# Patient Record
Sex: Male | Born: 1965 | Race: White | Hispanic: No | Smoking: Former smoker
Health system: Southern US, Community
[De-identification: ages and names within clinical notes are randomized; demographics above are authoritative.]

## PROBLEM LIST (undated history)

## (undated) DIAGNOSIS — K219 Gastro-esophageal reflux disease without esophagitis: Secondary | ICD-10-CM

## (undated) DIAGNOSIS — J449 Chronic obstructive pulmonary disease, unspecified: Secondary | ICD-10-CM

## (undated) DIAGNOSIS — G4730 Sleep apnea, unspecified: Secondary | ICD-10-CM

## (undated) DIAGNOSIS — J45909 Unspecified asthma, uncomplicated: Secondary | ICD-10-CM

## (undated) DIAGNOSIS — T7840XA Allergy, unspecified, initial encounter: Secondary | ICD-10-CM

## (undated) DIAGNOSIS — G43909 Migraine, unspecified, not intractable, without status migrainosus: Secondary | ICD-10-CM

## (undated) DIAGNOSIS — J40 Bronchitis, not specified as acute or chronic: Secondary | ICD-10-CM

## (undated) DIAGNOSIS — G4700 Insomnia, unspecified: Secondary | ICD-10-CM

## (undated) HISTORY — DX: Insomnia, unspecified: G47.00

## (undated) HISTORY — DX: Unspecified asthma, uncomplicated: J45.909

## (undated) HISTORY — DX: Gastro-esophageal reflux disease without esophagitis: K21.9

## (undated) HISTORY — DX: Migraine, unspecified, not intractable, without status migrainosus: G43.909

## (undated) HISTORY — DX: Allergy, unspecified, initial encounter: T78.40XA

## (undated) HISTORY — DX: Sleep apnea, unspecified: G47.30

## (undated) HISTORY — PX: KNEE SURGERY: SHX244

---

## 2004-03-03 ENCOUNTER — Ambulatory Visit (HOSPITAL_COMMUNITY): Admission: RE | Admit: 2004-03-03 | Discharge: 2004-03-03 | Payer: Self-pay | Admitting: Emergency Medicine

## 2004-12-09 ENCOUNTER — Ambulatory Visit (HOSPITAL_COMMUNITY): Admission: RE | Admit: 2004-12-09 | Discharge: 2004-12-09 | Payer: Self-pay | Admitting: Emergency Medicine

## 2009-05-25 ENCOUNTER — Inpatient Hospital Stay (HOSPITAL_COMMUNITY): Admission: EM | Admit: 2009-05-25 | Discharge: 2009-05-28 | Payer: Self-pay | Admitting: Emergency Medicine

## 2009-05-30 ENCOUNTER — Emergency Department (HOSPITAL_COMMUNITY): Admission: EM | Admit: 2009-05-30 | Discharge: 2009-05-30 | Payer: Self-pay | Admitting: Emergency Medicine

## 2009-08-14 ENCOUNTER — Ambulatory Visit (HOSPITAL_COMMUNITY): Admission: RE | Admit: 2009-08-14 | Discharge: 2009-08-14 | Payer: Self-pay | Admitting: Internal Medicine

## 2010-04-18 ENCOUNTER — Encounter: Payer: Self-pay | Admitting: Emergency Medicine

## 2010-06-18 LAB — DIFFERENTIAL
Basophils Absolute: 0 10*3/uL (ref 0.0–0.1)
Basophils Absolute: 0.1 10*3/uL (ref 0.0–0.1)
Basophils Relative: 0 % (ref 0–1)
Basophils Relative: 1 % (ref 0–1)
Eosinophils Absolute: 0 10*3/uL (ref 0.0–0.7)
Eosinophils Absolute: 0.6 10*3/uL (ref 0.0–0.7)
Eosinophils Relative: 0 % (ref 0–5)
Eosinophils Relative: 8 % — ABNORMAL HIGH (ref 0–5)
Lymphocytes Relative: 13 % (ref 12–46)
Lymphocytes Relative: 9 % — ABNORMAL LOW (ref 12–46)
Lymphs Abs: 0.7 10*3/uL (ref 0.7–4.0)
Lymphs Abs: 0.9 10*3/uL (ref 0.7–4.0)
Monocytes Absolute: 0.5 10*3/uL (ref 0.1–1.0)
Monocytes Absolute: 0.5 10*3/uL (ref 0.1–1.0)
Monocytes Relative: 6 % (ref 3–12)
Monocytes Relative: 7 % (ref 3–12)
Neutro Abs: 5.3 10*3/uL (ref 1.7–7.7)
Neutro Abs: 7.5 10*3/uL (ref 1.7–7.7)
Neutrophils Relative %: 72 % (ref 43–77)
Neutrophils Relative %: 86 % — ABNORMAL HIGH (ref 43–77)

## 2010-06-18 LAB — POCT CARDIAC MARKERS
CKMB, poc: 1.2 ng/mL (ref 1.0–8.0)
Myoglobin, poc: 77.4 ng/mL (ref 12–200)
Troponin i, poc: <0.05 ng/mL (ref 0.00–0.09)

## 2010-06-18 LAB — GLUCOSE, CAPILLARY
Glucose-Capillary: 124 mg/dL — ABNORMAL HIGH (ref 70–99)
Glucose-Capillary: 136 mg/dL — ABNORMAL HIGH (ref 70–99)
Glucose-Capillary: 158 mg/dL — ABNORMAL HIGH (ref 70–99)

## 2010-06-18 LAB — BLOOD GAS, ARTERIAL
Acid-base deficit: 1.9 mmol/L (ref 0.0–2.0)
Bicarbonate: 22.1 mEq/L (ref 20.0–24.0)
O2 Content: 2 L/min
O2 Saturation: 93.1 %
Patient temperature: 37
pCO2 arterial: 35.4 mmHg (ref 35.0–45.0)
pH, Arterial: 7.412 (ref 7.350–7.450)
pO2, Arterial: 77.7 mmHg — ABNORMAL LOW (ref 80.0–100.0)

## 2010-06-18 LAB — CBC
HCT: 41.2 % (ref 39.0–52.0)
HCT: 44.6 % (ref 39.0–52.0)
Hemoglobin: 14.3 g/dL (ref 13.0–17.0)
Hemoglobin: 15.4 g/dL (ref 13.0–17.0)
MCHC: 34.4 g/dL (ref 30.0–36.0)
MCHC: 34.8 g/dL (ref 30.0–36.0)
MCV: 90 fL (ref 78.0–100.0)
MCV: 90.4 fL (ref 78.0–100.0)
Platelets: 176 10*3/uL (ref 150–400)
Platelets: 188 10*3/uL (ref 150–400)
RBC: 4.57 MIL/uL (ref 4.22–5.81)
RBC: 4.93 MIL/uL (ref 4.22–5.81)
RDW: 12.9 % (ref 11.5–15.5)
RDW: 13.3 % (ref 11.5–15.5)
WBC: 7.3 10*3/uL (ref 4.0–10.5)
WBC: 8.8 10*3/uL (ref 4.0–10.5)

## 2010-06-18 LAB — COMPREHENSIVE METABOLIC PANEL
ALT: 24 U/L (ref 0–53)
AST: 19 U/L (ref 0–37)
Albumin: 3.9 g/dL (ref 3.5–5.2)
Alkaline Phosphatase: 65 U/L (ref 39–117)
BUN: 10 mg/dL (ref 6–23)
CO2: 23 mEq/L (ref 19–32)
Calcium: 8.9 mg/dL (ref 8.4–10.5)
Chloride: 103 mEq/L (ref 96–112)
Creatinine, Ser: 0.81 mg/dL (ref 0.4–1.5)
GFR calc Af Amer: >60 mL/min (ref >60)
GFR calc non Af Amer: >60 mL/min (ref >60)
Glucose, Bld: 141 mg/dL — ABNORMAL HIGH (ref 70–99)
Potassium: 3.6 mEq/L (ref 3.5–5.1)
Sodium: 134 mEq/L — ABNORMAL LOW (ref 135–145)
Total Bilirubin: 0.7 mg/dL (ref 0.3–1.2)
Total Protein: 7.1 g/dL (ref 6.0–8.3)

## 2010-06-18 LAB — BASIC METABOLIC PANEL
BUN: 11 mg/dL (ref 6–23)
CO2: 27 mEq/L (ref 19–32)
Calcium: 9.3 mg/dL (ref 8.4–10.5)
Chloride: 106 mEq/L (ref 96–112)
Creatinine, Ser: 0.83 mg/dL (ref 0.4–1.5)
GFR calc Af Amer: >60 mL/min (ref >60)
GFR calc non Af Amer: >60 mL/min (ref >60)
Glucose, Bld: 104 mg/dL — ABNORMAL HIGH (ref 70–99)
Potassium: 4 mEq/L (ref 3.5–5.1)
Sodium: 139 mEq/L (ref 135–145)

## 2010-06-18 LAB — TSH: TSH: 1.903 u[IU]/mL (ref 0.350–4.500)

## 2010-06-18 LAB — BRAIN NATRIURETIC PEPTIDE: Pro B Natriuretic peptide (BNP): <30 pg/mL (ref 0.0–100.0)

## 2010-06-22 LAB — BASIC METABOLIC PANEL
BUN: 16 mg/dL (ref 6–23)
BUN: 30 mg/dL — ABNORMAL HIGH (ref 6–23)
CO2: 23 mEq/L (ref 19–32)
Calcium: 9.2 mg/dL (ref 8.4–10.5)
Calcium: 9.4 mg/dL (ref 8.4–10.5)
Chloride: 102 mEq/L (ref 96–112)
Chloride: 104 mEq/L (ref 96–112)
Creatinine, Ser: 0.92 mg/dL (ref 0.4–1.5)
Creatinine, Ser: 1.22 mg/dL (ref 0.4–1.5)
GFR calc Af Amer: >60 mL/min (ref >60)
GFR calc Af Amer: >60 mL/min (ref >60)
GFR calc non Af Amer: >60 mL/min (ref >60)
GFR calc non Af Amer: >60 mL/min (ref >60)
Glucose, Bld: 141 mg/dL — ABNORMAL HIGH (ref 70–99)
Potassium: 4 mEq/L (ref 3.5–5.1)
Sodium: 136 mEq/L (ref 135–145)

## 2010-06-22 LAB — DIFFERENTIAL
Basophils Absolute: 0 K/uL (ref 0.0–0.1)
Basophils Relative: 0 % (ref 0–1)
Eosinophils Absolute: 0 10*3/uL (ref 0.0–0.7)
Eosinophils Relative: 0 % (ref 0–5)
Lymphocytes Relative: 15 % (ref 12–46)
Lymphs Abs: 1.9 10*3/uL (ref 0.7–4.0)
Monocytes Absolute: 1 K/uL (ref 0.1–1.0)
Monocytes Relative: 8 % (ref 3–12)
Neutro Abs: 10 10*3/uL — ABNORMAL HIGH (ref 1.7–7.7)
Neutrophils Relative %: 77 % (ref 43–77)

## 2010-06-22 LAB — POCT CARDIAC MARKERS
CKMB, poc: <1 ng/mL — ABNORMAL LOW (ref 1.0–8.0)
Myoglobin, poc: 72.1 ng/mL (ref 12–200)
Troponin i, poc: <0.05 ng/mL (ref 0.00–0.09)

## 2010-06-22 LAB — CBC
HCT: 55.3 % — ABNORMAL HIGH (ref 39.0–52.0)
Hemoglobin: 19.1 g/dL — ABNORMAL HIGH (ref 13.0–17.0)
MCHC: 34.6 g/dL (ref 30.0–36.0)
MCV: 92.3 fL (ref 78.0–100.0)
Platelets: 321 10*3/uL (ref 150–400)
RBC: 6 MIL/uL — ABNORMAL HIGH (ref 4.22–5.81)
RDW: 12.8 % (ref 11.5–15.5)
WBC: 13.1 10*3/uL — ABNORMAL HIGH (ref 4.0–10.5)

## 2010-06-22 LAB — D-DIMER, QUANTITATIVE: D-Dimer, Quant: 3.05 ug/mL-FEU — ABNORMAL HIGH (ref 0.00–0.48)

## 2010-06-22 LAB — GLUCOSE, CAPILLARY
Glucose-Capillary: 103 mg/dL — ABNORMAL HIGH (ref 70–99)
Glucose-Capillary: 119 mg/dL — ABNORMAL HIGH (ref 70–99)
Glucose-Capillary: 126 mg/dL — ABNORMAL HIGH (ref 70–99)
Glucose-Capillary: 141 mg/dL — ABNORMAL HIGH (ref 70–99)
Glucose-Capillary: 156 mg/dL — ABNORMAL HIGH (ref 70–99)

## 2010-06-22 LAB — BASIC METABOLIC PANEL WITH GFR
CO2: 22 meq/L (ref 19–32)
Glucose, Bld: 114 mg/dL — ABNORMAL HIGH (ref 70–99)
Potassium: 4 meq/L (ref 3.5–5.1)
Sodium: 135 meq/L (ref 135–145)

## 2010-06-22 LAB — HEMOGLOBIN A1C
Hgb A1c MFr Bld: 6 % (ref 4.6–6.1)
Mean Plasma Glucose: 126 mg/dL

## 2010-06-22 LAB — APTT: aPTT: 26 s (ref 24–37)

## 2010-06-22 LAB — PROTIME-INR
INR: 0.94 (ref 0.00–1.49)
Prothrombin Time: 12.5 seconds (ref 11.6–15.2)

## 2011-04-21 ENCOUNTER — Ambulatory Visit: Payer: Self-pay

## 2011-04-21 DIAGNOSIS — J45909 Unspecified asthma, uncomplicated: Secondary | ICD-10-CM

## 2011-04-21 DIAGNOSIS — G4700 Insomnia, unspecified: Secondary | ICD-10-CM

## 2011-07-07 ENCOUNTER — Ambulatory Visit: Payer: Self-pay | Admitting: Family Medicine

## 2011-07-07 VITALS — BP 122/80 | HR 103 | Temp 98.7°F | Resp 16 | Ht 71.0 in | Wt 269.0 lb

## 2011-07-07 DIAGNOSIS — G4700 Insomnia, unspecified: Secondary | ICD-10-CM

## 2011-07-07 DIAGNOSIS — L02219 Cutaneous abscess of trunk, unspecified: Secondary | ICD-10-CM

## 2011-07-07 DIAGNOSIS — J45901 Unspecified asthma with (acute) exacerbation: Secondary | ICD-10-CM

## 2011-07-07 DIAGNOSIS — L02211 Cutaneous abscess of abdominal wall: Secondary | ICD-10-CM

## 2011-07-07 MED ORDER — DOXYCYCLINE HYCLATE 100 MG PO TABS: 100.0000 mg | ORAL_TABLET | Freq: Two times a day (BID) | ORAL | Status: AC

## 2011-07-07 MED ORDER — ALBUTEROL SULFATE HFA 108 (90 BASE) MCG/ACT IN AERS: 2.0000 | INHALATION_SPRAY | Freq: Four times a day (QID) | RESPIRATORY_TRACT | Status: DC | PRN

## 2011-07-07 MED ORDER — BUDESONIDE-FORMOTEROL FUMARATE 160-4.5 MCG/ACT IN AERO: 2.0000 | INHALATION_SPRAY | RESPIRATORY_TRACT | Status: DC | PRN

## 2011-07-07 MED ORDER — CLONAZEPAM 1 MG PO TABS: 2.0000 mg | ORAL_TABLET | Freq: Every evening | ORAL | Status: DC | PRN

## 2011-07-07 MED ORDER — PREDNISONE 20 MG PO TABS: ORAL_TABLET | ORAL | Status: DC

## 2011-07-07 MED ORDER — AZITHROMYCIN 250 MG PO TABS: ORAL_TABLET | ORAL | Status: DC

## 2011-07-07 NOTE — Progress Notes (Signed)
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  1 he's had about 2 weeks of progressive wheezing and cough which has been productive of small amounts of yellow mucus. He's had no hemoptysis or shortness of breath. His symptoms are progressive and in the past she has required prednisone to break these episodes. He is about out of his pro-air now and he stopped his Symbicort  2 the patient has chronic problems with insomnia. He's tried a variety of medicines including Xanax, lorazepam, Ambien, and Flexeril. Last night he slept fairly well using Flexeril lorazepam and Xanax which he borrowed. He's had a lifelong problem with insomnia and frequently awakens with even faint noises.  3 patient has 1-2 weeks of right upper quadrant abdominal wall redness and swelling unresponsive to heat.  Objective: No acute distress, talkative. I spent 15 minutes talking about insomnia with patient  HEENT: Unremarkable  Chest: Diffuse rhonchi and wheezes bilaterally without rales.  Heart: Regular no murmur  Abdomen: 2 x 4 cm elliptical swollen erythematous area over the 11th rib.    Assessment: Chronic insomnia, not responding to lorazepam; acute bronchitis with reactive airways disease in a smoker; small abscess on right abdomen  Plan: Switch from lorazepam to clonazepam  Prednisone doxycycline and pro-air with Symbicort daily  Send abscess culture to lab

## 2011-07-07 NOTE — Progress Notes (Signed)
VCO.  Local anesthesia with .5 cc 2% plain lidocaine. SP&D. Incision with 11 blade.  Purulence and sebaceous material expressed.  Irrigated with 4.5 cc 2% lidocaine plain, then packed with 1/2-inch plain packing.  Dressed with Telfa, gauze and Tegaderm.

## 2011-07-07 NOTE — Patient Instructions (Addendum)
Asthma Attack Prevention HOW CAN ASTHMA BE PREVENTED? Currently, there is no way to prevent asthma from starting. However, you can take steps to control the disease and prevent its symptoms after you have been diagnosed. Learn about your asthma and how to control it. Take an active role to control your asthma by working with your caregiver to create and follow an asthma action plan. An asthma action plan guides you in taking your medicines properly, avoiding factors that make your asthma worse, tracking your level of asthma control, responding to worsening asthma, and seeking emergency care when needed. To track your asthma, keep records of your symptoms, check your peak flow number using a peak flow meter (handheld device that shows how well air moves out of your lungs), and get regular asthma checkups.  Other ways to prevent asthma attacks include:  Use medicines as your caregiver directs.   Identify and avoid things that make your asthma worse (as much as you can).   Keep track of your asthma symptoms and level of control.   Get regular checkups for your asthma.   With your caregiver, write a detailed plan for taking medicines and managing an asthma attack. Then be sure to follow your action plan. Asthma is an ongoing condition that needs regular monitoring and treatment.   Identify and avoid asthma triggers. A number of outdoor allergens and irritants (pollen, mold, cold air, air pollution) can trigger asthma attacks. Find out what causes or makes your asthma worse, and take steps to avoid those triggers (see below).   Monitor your breathing. Learn to recognize warning signs of an attack, such as slight coughing, wheezing or shortness of breath. However, your lung function may already decrease before you notice any signs or symptoms, so regularly measure and record your peak airflow with a home peak flow meter.   Identify and treat attacks early. If you act quickly, you're less likely to have  a severe attack. You will also need less medicine to control your symptoms. When your peak flow measurements decrease and alert you to an upcoming attack, take your medicine as instructed, and immediately stop any activity that may have triggered the attack. If your symptoms do not improve, get medical help.   Pay attention to increasing quick-relief inhaler use. If you find yourself relying on your quick-relief inhaler (such as albuterol), your asthma is not under control. See your caregiver about adjusting your treatment.  IDENTIFY AND CONTROL FACTORS THAT MAKE YOUR ASTHMA WORSE A number of common things can set off or make your asthma symptoms worse (asthma triggers). Keep track of your asthma symptoms for several weeks, detailing all the environmental and emotional factors that are linked with your asthma. When you have an asthma attack, go back to your asthma diary to see which factor, or combination of factors, might have contributed to it. Once you know what these factors are, you can take steps to control many of them.  Allergies: If you have allergies and asthma, it is important to take asthma prevention steps at home. Asthma attacks (worsening of asthma symptoms) can be triggered by allergies, which can cause temporary increased inflammation of your airways. Minimizing contact with the substance to which you are allergic will help prevent an asthma attack. Animal Dander:   Some people are allergic to the flakes of skin or dried saliva from animals with fur or feathers. Keep these pets out of your home.   If you can't keep a pet outdoors, keep the   pet out of your bedroom and other sleeping areas at all times, and keep the door closed.   Remove carpets and furniture covered with cloth from your home. If that is not possible, keep the pet away from fabric-covered furniture and carpets.  Dust Mites:  Many people with asthma are allergic to dust mites. Dust mites are tiny bugs that are found in  every home, in mattresses, pillows, carpets, fabric-covered furniture, bedcovers, clothes, stuffed toys, fabric, and other fabric-covered items.   Cover your mattress in a special dust-proof cover.   Cover your pillow in a special dust-proof cover, or wash the pillow each week in hot water. Water must be hotter than 130 F to kill dust mites. Cold or warm water used with detergent and bleach can also be effective.   Wash the sheets and blankets on your bed each week in hot water.   Try not to sleep or lie on cloth-covered cushions.   Call ahead when traveling and ask for a smoke-free hotel room. Bring your own bedding and pillows, in case the hotel only supplies feather pillows and down comforters, which may contain dust mites and cause asthma symptoms.   Remove carpets from your bedroom and those laid on concrete, if you can.   Keep stuffed toys out of the bed, or wash the toys weekly in hot water or cooler water with detergent and bleach.  Cockroaches:  Many people with asthma are allergic to the droppings and remains of cockroaches.   Keep food and garbage in closed containers. Never leave food out.   Use poison baits, traps, powders, gels, or paste (for example, boric acid).   If a spray is used to kill cockroaches, stay out of the room until the odor goes away.  Indoor Mold:  Fix leaky faucets, pipes, or other sources of water that have mold around them.   Clean moldy surfaces with a cleaner that has bleach in it.  Pollen and Outdoor Mold:  When pollen or mold spore counts are high, try to keep your windows closed.   Stay indoors with windows closed from late morning to afternoon, if you can. Pollen and some mold spore counts are highest at that time.   Ask your caregiver whether you need to take or increase anti-inflammatory medicine before your allergy season starts.  Irritants:   Tobacco smoke is an irritant. If you smoke, ask your caregiver how you can quit. Ask family  members to quit smoking, too. Do not allow smoking in your home or car.   If possible, do not use a wood-burning stove, kerosene heater, or fireplace. Minimize exposure to all sources of smoke, including incense, candles, fires, and fireworks.   Try to stay away from strong odors and sprays, such as perfume, talcum powder, hair spray, and paints.   Decrease humidity in your home and use an indoor air cleaning device. Reduce indoor humidity to below 60 percent. Dehumidifiers or central air conditioners can do this.   Try to have someone else vacuum for you once or twice a week, if you can. Stay out of rooms while they are being vacuumed and for a short while afterward.   If you vacuum, use a dust mask from a hardware store, a double-layered or microfilter vacuum cleaner bag, or a vacuum cleaner with a HEPA filter.   Sulfites in foods and beverages can be irritants. Do not drink beer or wine, or eat dried fruit, processed potatoes, or shrimp if they cause asthma   symptoms.   Cold air can trigger an asthma attack. Cover your nose and mouth with a scarf on cold or windy days.   Several health conditions can make asthma more difficult to manage, including runny nose, sinus infections, reflux disease, psychological stress, and sleep apnea. Your caregiver will treat these conditions, as well.   Avoid close contact with people who have a cold or the flu, since your asthma symptoms may get worse if you catch the infection from them. Wash your hands thoroughly after touching items that may have been handled by people with a respiratory infection.   Get a flu shot every year to protect against the flu virus, which often makes asthma worse for days or weeks. Also get a pneumonia shot once every five to 10 years.  Drugs:  Aspirin and other painkillers can cause asthma attacks. 10% to 20% of people with asthma have sensitivity to aspirin or a group of painkillers called non-steroidal anti-inflammatory drugs  (NSAIDS), such as ibuprofen and naproxen. These drugs are used to treat pain and reduce fevers. Asthma attacks caused by any of these medicines can be severe and even fatal. These drugs must be avoided in people who have known aspirin sensitive asthma. Products with acetaminophen are considered safe for people who have asthma. It is important that people with aspirin sensitivity read labels of all over-the-counter drugs used to treat pain, colds, coughs, and fever.   Beta blockers and ACE inhibitors are other drugs which you should discuss with your caregiver, in relation to your asthma.  ALLERGY SKIN TESTING  Ask your asthma caregiver about allergy skin testing or blood testing (RAST test) to identify the allergens to which you are sensitive. If you are found to have allergies, allergy shots (immunotherapy) for asthma may help prevent future allergies and asthma. With allergy shots, small doses of allergens (substances to which you are allergic) are injected under your skin on a regular schedule. Over a period of time, your body may become used to the allergen and less responsive with asthma symptoms. You can also take measures to minimize your exposure to those allergens. EXERCISE  If you have exercise-induced asthma, or are planning vigorous exercise, or exercise in cold, humid, or dry environments, prevent exercise-induced asthma by following your caregiver's advice regarding asthma treatment before exercising. Document Released: 03/03/2009 Document Revised: 03/04/2011 Document Reviewed: 03/03/2009 [facility] Patient Information 2012 [place], [place].Insomnia Insomnia is frequent trouble falling and/or staying asleep. Insomnia can be a long term problem or a short term problem. Both are common. Insomnia can be a short term problem when the wakefulness is related to a certain stress or worry. Long term insomnia is often related to ongoing stress during waking hours and/or poor sleeping habits. Overtime,  sleep deprivation itself can make the problem worse. Every little thing feels more severe because you are overtired and your ability to cope is decreased. CAUSES   Stress, anxiety, and depression.   Poor sleeping habits.   Distractions such as TV in the bedroom.   Naps close to bedtime.   Engaging in emotionally charged conversations before bed.   Technical reading before sleep.   Alcohol and other sedatives. They may make the problem worse. They can hurt normal sleep patterns and normal dream activity.   Stimulants such as caffeine for several hours prior to bedtime.   Pain syndromes and shortness of breath can cause insomnia.   Exercise late at night.   Changing time zones may cause sleeping problems (jet lag).  It is sometimes helpful to have someone observe your sleeping patterns. They should look for periods of not breathing during the night (sleep apnea). They should also look to see how long those periods last. If you live alone or observers are uncertain, you can also be observed at a sleep clinic where your sleep patterns will be professionally monitored. Sleep apnea requires a checkup and treatment. Give your caregivers your medical history. Give your caregivers observations your family has made about your sleep.  SYMPTOMS   Not feeling rested in the morning.   Anxiety and restlessness at bedtime.   Difficulty falling and staying asleep.  TREATMENT   Your caregiver may prescribe treatment for an underlying medical disorders. Your caregiver can give advice or help if you are using alcohol or other drugs for self-medication. Treatment of underlying problems will usually eliminate insomnia problems.   Medications can be prescribed for short time use. They are generally not recommended for lengthy use.   Over-the-counter sleep medicines are not recommended for lengthy use. They can be habit forming.   You can promote easier sleeping by making lifestyle changes such as:     Using relaxation techniques that help with breathing and reduce muscle tension.   Exercising earlier in the day.   Changing your diet and the time of your last meal. No night time snacks.   Establish a regular time to go to bed.   Counseling can help with stressful problems and worry.   Soothing music and white noise may be helpful if there are background noises you cannot remove.   Stop tedious detailed work at least one hour before bedtime.  HOME CARE INSTRUCTIONS   Keep a diary. Inform your caregiver about your progress. This includes any medication side effects. See your caregiver regularly. Take note of:   Times when you are asleep.   Times when you are awake during the night.   The quality of your sleep.   How you feel the next day.  This information will help your caregiver care for you.  Get out of bed if you are still awake after 15 minutes. Read or do some quiet activity. Keep the lights down. Wait until you feel sleepy and go back to bed.   Keep regular sleeping and waking hours. Avoid naps.   Exercise regularly.   Avoid distractions at bedtime. Distractions include watching television or engaging in any intense or detailed activity like attempting to balance the household checkbook.   Develop a bedtime ritual. Keep a familiar routine of bathing, brushing your teeth, climbing into bed at the same time each night, listening to soothing music. Routines increase the success of falling to sleep faster.   Use relaxation techniques. This can be using breathing and muscle tension release routines. It can also include visualizing peaceful scenes. You can also help control troubling or intruding thoughts by keeping your mind occupied with boring or repetitive thoughts like the old concept of counting sheep. You can make it more creative like imagining planting one beautiful flower after another in your backyard garden.   During your day, work to eliminate stress. When this  is not possible use some of the previous suggestions to help reduce the anxiety that accompanies stressful situations.  MAKE SURE YOU:   Understand these instructions.   Will watch your condition.   Will get help right away if you are not doing well or get worse.  Document Released: 03/12/2000 Document Revised: 03/04/2011 Document Reviewed: 04/12/2007 [employer]  Patient Information 2012 [place], [place].   Apply warms compresses to the area 2-3 times daily.  Keep it covered, replace the dressing if it leaks, falls off or becomes saturated.

## 2011-07-10 ENCOUNTER — Ambulatory Visit (INDEPENDENT_AMBULATORY_CARE_PROVIDER_SITE_OTHER): Payer: Self-pay | Admitting: Physician Assistant

## 2011-07-10 ENCOUNTER — Ambulatory Visit: Payer: Self-pay

## 2011-07-10 VITALS — BP 131/82 | HR 94 | Temp 97.8°F | Resp 18 | Wt 265.0 lb

## 2011-07-10 DIAGNOSIS — L03319 Cellulitis of trunk, unspecified: Secondary | ICD-10-CM

## 2011-07-10 DIAGNOSIS — L02219 Cutaneous abscess of trunk, unspecified: Secondary | ICD-10-CM

## 2011-07-10 NOTE — Progress Notes (Signed)
  Subjective:    Patient ID: Derek Pacheco, male    DOB: 09-06-1965, 46 y.o.   MRN: [id number]  HPI S/P I&D 07/07/11. Doing well.  No problems w/ antibiotics.  Area much less painful    Review of Systems  All other systems reviewed and are negative.       Objective:   Physical Exam  Constitutional: He is oriented to person, place, and time. He appears well-developed and well-nourished.  HENT:  Head: Normocephalic and atraumatic.  Neurological: He is alert and oriented to person, place, and time.  Skin: Skin is warm and dry. No erythema.       Packing removed.  No purulent drainage.  Proper granulating at base.  Flushed w/ 2%plain lidocaine.  Repacked with 1/4" plain guaze ~10cm. Taped down.  telfa and extra telfa given.          Assessment & Plan:  Cellulitis S/P I&D-doing well Wound care discussed.  Recheck and likely pack again in 3 days.  Continue antibiotics

## 2011-07-12 LAB — CULTURE, ROUTINE-ABSCESS

## 2011-07-13 ENCOUNTER — Ambulatory Visit (INDEPENDENT_AMBULATORY_CARE_PROVIDER_SITE_OTHER): Payer: Self-pay | Admitting: Family Medicine

## 2011-07-13 VITALS — BP 116/77 | HR 89 | Temp 98.3°F | Resp 16 | Ht 71.0 in | Wt 273.0 lb

## 2011-07-13 DIAGNOSIS — L02219 Cutaneous abscess of trunk, unspecified: Secondary | ICD-10-CM

## 2011-07-13 DIAGNOSIS — L02211 Cutaneous abscess of abdominal wall: Secondary | ICD-10-CM

## 2011-07-13 NOTE — Progress Notes (Signed)
  Subjective:    Patient ID: Derek Pacheco, male    DOB: [birth year], 46 y.o.   MRN: [id number]  HPI    Review of Systems     Objective:   Physical Exam        Assessment & Plan:   Subjective:     Derek Pacheco is a 46 y.o. male who presents today for wound check. Patient has a I&D site wound which is located on the trunk. Current symptoms: wound healing as expected. Symptoms began several days ago. Pain is rated 3/10. Interventions to date: packing changed 3 day ago and dressing changed 3 days ago.   Objective:    BP 116/77  Pulse 89  Temp(Src) 98.3 F (36.8 C) (Oral)  Resp 16  Ht 5\' 11"  (1.803 m)  Wt 273 lb (123.832 kg)  BMI 38.08 kg/m2  Wound:   wound margins intact and healing well.  No signs of infection.     Assessment:    Wound check. Cares provided were removal of existing dressing visual inspection wound packing with sterile 1/4 inch plain packing    Plan:    1. Discussed appropriate home care of this wound., Wound re-packed. 2. Patient instructions were given. 3. Follow up: 3 days.

## 2011-07-13 NOTE — Progress Notes (Signed)
  Subjective:    Patient ID: Derek Pacheco, male    DOB: 1965/07/30, 46 y.o.   MRN: [id number]  HPI    Review of Systems     Objective:   Physical Exam        Assessment & Plan:

## 2011-07-13 NOTE — Progress Notes (Signed)
Subjective: Patient is here for his wound to be doing well.   Objective About 6 inches of packing strip were removed from the hole. There is a very nice clean cavity. This will need to be repacked to continue healing from the inside out. There is some old dried blood around it that we will clean up.  Assessment: Abscess right upper abdominal wall  Plan  Continue taking the doxycycline until it is finished. Jeffreys PA we'll do the packing and instructed to return

## 2011-07-16 ENCOUNTER — Ambulatory Visit (INDEPENDENT_AMBULATORY_CARE_PROVIDER_SITE_OTHER): Payer: Self-pay | Admitting: Family Medicine

## 2011-07-16 VITALS — BP 121/79 | HR 102 | Temp 98.6°F | Resp 20 | Ht 72.0 in | Wt 265.0 lb

## 2011-07-16 DIAGNOSIS — L02211 Cutaneous abscess of abdominal wall: Secondary | ICD-10-CM

## 2011-07-16 DIAGNOSIS — L0291 Cutaneous abscess, unspecified: Secondary | ICD-10-CM

## 2011-07-16 DIAGNOSIS — L0390 Cellulitis, unspecified: Secondary | ICD-10-CM

## 2011-07-16 NOTE — Progress Notes (Signed)
  Subjective:    Patient ID: Derek Pacheco, male    DOB: 30-Jun-1965, 46 y.o.   MRN: [id number]  HPI Abscess abdominal wall  Denies significant drainage Sore and itchy around incision  No fever   Review of Systems     Objective:   Physical Exam  Constitutional: He appears well-developed.  Cardiovascular: Normal rate.   Pulmonary/Chest: Effort normal and breath sounds normal.  Neurological: He is alert.  Skin:             Assessment & Plan:  Abscess (R) abdomen  S/P packing change Anticipatory guidance

## 2011-07-18 ENCOUNTER — Ambulatory Visit: Payer: Self-pay

## 2011-07-18 ENCOUNTER — Ambulatory Visit: Payer: Self-pay | Admitting: Family Medicine

## 2011-07-18 ENCOUNTER — Ambulatory Visit (HOSPITAL_COMMUNITY)
Admission: RE | Admit: 2011-07-18 | Discharge: 2011-07-18 | Disposition: A | Discharge status: Home / Self Care | Payer: Self-pay | Source: Ambulatory Visit | Attending: Family Medicine | Admitting: Family Medicine

## 2011-07-18 VITALS — BP 129/80 | HR 88 | Temp 97.5°F | Resp 16 | Ht 71.0 in | Wt 274.0 lb

## 2011-07-18 DIAGNOSIS — M7041 Prepatellar bursitis, right knee: Secondary | ICD-10-CM

## 2011-07-18 DIAGNOSIS — L02211 Cutaneous abscess of abdominal wall: Secondary | ICD-10-CM

## 2011-07-18 DIAGNOSIS — M79609 Pain in unspecified limb: Secondary | ICD-10-CM

## 2011-07-18 DIAGNOSIS — B370 Candidal stomatitis: Secondary | ICD-10-CM

## 2011-07-18 DIAGNOSIS — R059 Cough, unspecified: Secondary | ICD-10-CM

## 2011-07-18 DIAGNOSIS — Z8639 Personal history of other endocrine, nutritional and metabolic disease: Secondary | ICD-10-CM

## 2011-07-18 DIAGNOSIS — L723 Sebaceous cyst: Secondary | ICD-10-CM

## 2011-07-18 DIAGNOSIS — M7989 Other specified soft tissue disorders: Secondary | ICD-10-CM

## 2011-07-18 DIAGNOSIS — R05 Cough: Secondary | ICD-10-CM

## 2011-07-18 DIAGNOSIS — M79604 Pain in right leg: Secondary | ICD-10-CM

## 2011-07-18 LAB — POCT CBC
Granulocyte percent: 69 %G (ref 37–80)
HCT, POC: 42.9 % — AB (ref 43.5–53.7)
MCH, POC: 29.8 pg (ref 27–31.2)
MCV: 88.9 fL (ref 80–97)
POC LYMPH PERCENT: 24.1 %L (ref 10–50)
RBC: 4.83 M/uL (ref 4.69–6.13)
WBC: 6.7 10*3/uL (ref 4.6–10.2)

## 2011-07-18 MED ORDER — FLUCONAZOLE 100 MG PO TABS: ORAL_TABLET | ORAL | Status: DC

## 2011-07-18 MED ORDER — DOXYCYCLINE HYCLATE 100 MG PO TABS: 100.0000 mg | ORAL_TABLET | Freq: Two times a day (BID) | ORAL | Status: AC

## 2011-07-18 MED ORDER — CHLORHEXIDINE GLUCONATE 0.12 % MT SOLN: 15.0000 mL | Freq: Two times a day (BID) | OROMUCOSAL | Status: AC

## 2011-07-18 NOTE — Progress Notes (Signed)
Subjective:  46-year-old diabetic patient who comes in complaining of a cough for one week. He missed work Wednesday and Friday with this. He is coughing so hard he cannot rest. He says the cough is worse when he lays down at night. He has broken out in sweats soaked his bed clothing but has not recorded her temperature at times they have checked her fever has been normal. He is bringing up purulent-looking phlegm  Objective: TMs left is oriented right normal throat clear neck supple without significant nodes chest has a ptotic congestion on the right but not clearcut rales heart regular without murmurs  Assessment:  Cough Right otitis  Plan: Check CBC and chest x-ray.

## 2011-07-18 NOTE — Progress Notes (Signed)
  Subjective:    Patient ID: Derek Pacheco, male    DOB: [birth year], 46 y.o.   MRN: [id number]  HPI Multiple issues today.  1) presents for recheck of wound. Denies erythema and drainage  2) works on knee installing partitions. (R) knee pain and swelling that has since resolved. Typically wears knee pads however forgot the last several days  3) rash on (L) anterior tibia; picked at it and became inflamed. (L) leg did swell.  History of elevated BS years ago during hospitalization. On discharge follow up BS was normal however has not had rechecked since.   4) sore mouth- using Symbicort and forgetting to rinse mouth after use   Review of Systems     Objective:   Physical Exam  Constitutional: He appears well-developed.  HENT:  Mouth/Throat: Oropharyngeal exudate and posterior oropharyngeal erythema present.  Neck: Neck supple.  Cardiovascular: Normal rate, regular rhythm and normal heart sounds.   Pulmonary/Chest: Effort normal and breath sounds normal.  Musculoskeletal:       Right knee: He exhibits normal range of motion, no swelling and no effusion. no tenderness found. No medial joint line tenderness noted.       Legs: Neurological: He is alert.  Skin: Lesion noted.      Results for orders placed in visit on 07/18/11  POCT CBC      Component Value Range   WBC 6.7  4.6 - 10.2 (K/uL)   Lymph, poc 1.6  0.6 - 3.4    POC LYMPH PERCENT 24.1  10 - 50 (%L)   MID (cbc) 0.5  0 - 0.9    POC MID % 6.9  0 - 12 (%M)   POC Granulocyte 4.6  2 - 6.9    Granulocyte percent 69.0  37 - 80 (%G)   RBC 4.83  4.69 - 6.13 (M/uL)   Hemoglobin 14.4  14.1 - 18.1 (g/dL)   HCT, POC 42.5 (*) 95.6 - 53.7 (%)   MCV 88.9  80 - 97 (fL)   MCH, POC 29.8  27 - 31.2 (pg)   MCHC 33.6  31.8 - 35.4 (g/dL)   RDW, POC 38.7     Platelet Count, POC 201  142 - 424 (K/uL)   MPV 10.7  0 - 99.8 (fL)  GLUCOSE, POCT (MANUAL RESULT ENTRY)      Component Value Range   POC Glucose 113       Assessment &  Plan:   1. Cough; asthma  Continue current therapy  2. History of elevated glucose  POCT glucose (manual entry)  3. Oral candidiasis  fluconazole (DIFLUCAN) 100 MG tablet, chlorhexidine (PERIDEX) 0.12 % solution  4. Prepatellar bursitis of right knee  OTC tylenol or advil; wear knee protection regularly  5. Abscess of abdominal wall  No further care needed; silvadene dressing applied  6. Leg pain, right  doxycycline (VIBRA-TABS) 100 MG tablet, Lower Extremity Venous Duplex Right   Follow up after doppler study.  Venous doppler negative for DVT; patient notified 12:42 PM

## 2011-07-22 ENCOUNTER — Ambulatory Visit: Payer: Self-pay | Admitting: Family Medicine

## 2011-07-22 ENCOUNTER — Telehealth: Payer: Self-pay

## 2011-07-22 VITALS — BP 133/84 | HR 87 | Temp 98.1°F | Resp 16 | Ht 71.0 in | Wt 270.6 lb

## 2011-07-22 DIAGNOSIS — R233 Spontaneous ecchymoses: Secondary | ICD-10-CM

## 2011-07-22 DIAGNOSIS — T148XXA Other injury of unspecified body region, initial encounter: Secondary | ICD-10-CM

## 2011-07-22 LAB — POCT CBC
Lymph, poc: 2.5 (ref 0.6–3.4)
MCH, POC: 29.3 pg (ref 27–31.2)
MCHC: 32.7 g/dL (ref 31.8–35.4)
MID (cbc): 0.7 (ref 0–0.9)
MPV: 10.6 fL (ref 0–99.8)
POC MID %: 6.9 %M (ref 0–12)
Platelet Count, POC: 278 10*3/uL (ref 142–424)
RDW, POC: 13.9 %
WBC: 9.8 10*3/uL (ref 4.6–10.2)

## 2011-07-22 NOTE — Telephone Encounter (Signed)
.  [facility] Patient was seen by Dr. Hal Hope on 07/18/11.  Patient requests call back from Dr. Hal Hope regarding his leg and the status of the healing of his leg.  Please call patient at [phone].

## 2011-07-22 NOTE — Telephone Encounter (Signed)
Patient states that his leg is bruised below knee to ankle.  It was red before and turning blue.  Would like to know what to do.  Advised patient to come into clinic today for recheck.  Is this ok or would you like for [place] to call him and do something different?

## 2011-07-22 NOTE — Telephone Encounter (Signed)
LE venous doppler normal Ask patient to RTC

## 2011-07-22 NOTE — Progress Notes (Signed)
  Subjective:    Patient ID: Derek Pacheco, male    DOB: [birth year], 46 y.o.   MRN: [id number]  HPI Patient presents to clinic concerned about discoloration and swelling to the (R) anterior tibia Swelling in that leg has improved. Patient has had some swelling in his (L) leg but to a lesser degree.  LE venous dopplers negative  Patient has been on prednisone recently for RAD; stands or works on his knees 8+  hours a day. Increase Na+ in diet  Review of Systems     Objective:   Physical Exam  Constitutional: He appears well-developed.  HENT:  Head: Normocephalic.  Neck: No JVD present. No thyromegaly present.  Cardiovascular: Normal rate, regular rhythm and normal heart sounds.   Pulmonary/Chest: Effort normal and breath sounds normal.  Musculoskeletal: Normal range of motion.  Neurological: He is alert.  Skin:       1+ edema (R) leg; suspect hemociderin deposits associated with venous stasis; no cords   Results for orders placed in visit on 07/22/11  POCT CBC      Component Value Range   WBC 9.8  4.6 - 10.2 (K/uL)   Lymph, poc 2.5  0.6 - 3.4    POC LYMPH PERCENT 25.2  10 - 50 (%L)   MID (cbc) 0.7  0 - 0.9    POC MID % 6.9  0 - 12 (%M)   POC Granulocyte 6.7  2 - 6.9    Granulocyte percent 67.9  37 - 80 (%G)   RBC 5.19  4.69 - 6.13 (M/uL)   Hemoglobin 15.2  14.1 - 18.1 (g/dL)   HCT, POC 40.9  81.1 - 53.7 (%)   MCV 89.5  80 - 97 (fL)   MCH, POC 29.3  27 - 31.2 (pg)   MCHC 32.7  31.8 - 35.4 (g/dL)   RDW, POC 91.4     Platelet Count, POC 278  142 - 424 (K/uL)   MPV 10.6  0 - 99.8 (fL)       Assessment & Plan:  LE edema secondary to recent prednisone; high sodium diet and 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  Compression hose/Low Na+ diet Reassurance there is no secondary infection.

## 2011-07-25 ENCOUNTER — Encounter: Payer: Self-pay | Admitting: Family Medicine

## 2012-01-03 ENCOUNTER — Other Ambulatory Visit: Payer: Self-pay | Admitting: Family Medicine

## 2012-01-03 NOTE — Telephone Encounter (Signed)
At tl desk 

## 2012-01-04 ENCOUNTER — Other Ambulatory Visit: Payer: Self-pay | Admitting: Family Medicine

## 2012-01-04 ENCOUNTER — Other Ambulatory Visit: Payer: Self-pay | Admitting: *Deleted

## 2012-01-16 ENCOUNTER — Ambulatory Visit: Payer: Self-pay | Admitting: Emergency Medicine

## 2012-01-16 VITALS — BP 132/84 | HR 108 | Temp 97.6°F | Resp 17 | Ht 73.0 in | Wt 272.0 lb

## 2012-01-16 DIAGNOSIS — G4700 Insomnia, unspecified: Secondary | ICD-10-CM

## 2012-01-16 MED ORDER — CLONAZEPAM 1 MG PO TABS: 2.0000 mg | ORAL_TABLET | Freq: Every evening | ORAL | Status: DC | PRN

## 2012-01-16 NOTE — Progress Notes (Signed)
[facility] [address], [place] [address] [phone]  Date:  01/16/2012   Name:  Derek Pacheco   DOB:  1965/12/25   MRN:  [id number]  PCP:  No primary provider on file.    Chief Complaint: Medication Refill   History of Present Illness:  Derek Pacheco is a 46 y.o. very pleasant male patient who presents with the following:  History of insomnia.  Says that with clonazepam he can only sleep 5 hours.  No recent asthma exacerbations.  Denies other complaint.  There is no problem list on file for this patient.   No past medical history on file.  No past surgical history on file.  History  Substance Use Topics  . Smoking status: Current Every Day Smoker -- 0.3 packs/day for 10 years    Types: Cigarettes  . Smokeless tobacco: Not on file  . Alcohol Use: Not on file    No family history on file.  No Known Allergies  Medication list has been reviewed and updated.  Current Outpatient Prescriptions on File Prior to Visit  Medication Sig Dispense Refill  . albuterol (PROAIR HFA) 108 (90 BASE) MCG/ACT inhaler Inhale 2 puffs into the lungs every 6 (six) hours as needed for wheezing (cough or wheezing).  1 Inhaler  2  . budesonide-formoterol (SYMBICORT) 160-4.5 MCG/ACT inhaler Inhale 2 puffs into the lungs as needed.  1 Inhaler  11  . clonazePAM (KLONOPIN) 1 MG tablet TAKE TWO TABLETS BY MOUTH AT BEDTIME AS NEEDED FOR ANXIETY  60 tablet  0  . fluconazole (DIFLUCAN) 100 MG tablet 2 today then 1 daily for six days  8 tablet  0  . predniSONE (DELTASONE) 20 MG tablet 3-2-2-1-1-1  10 tablet  0    Review of Systems:  As per HPI, otherwise negative.    Physical Examination: Filed Vitals:   01/16/12 0959  BP: 132/84  Pulse: 108  Temp: 97.6 F (36.4 C)  Resp: 17   Filed Vitals:   01/16/12 0959  Height: 6\' 1"  (1.854 m)  Weight: 272 lb (123.378 kg)   Body mass index is 35.89 kg/(m^2). Ideal Body Weight: Weight in (lb) to have BMI = 25:  189.1    GEN: WDWN, NAD, Non-toxic, Alert & Oriented x 3 HEENT: Atraumatic, Normocephalic.  Ears and Nose: No external deformity. EXTR: No clubbing/cyanosis/edema NEURO: Normal gait.  PSYCH: Normally interactive. Conversant. Not depressed or anxious appearing.  Calm demeanor.    Assessment and Plan: Insomnia Continue clonipine Follow up in 6 months with Dr Laurelyn Sickle, Tessa Lerner, MD

## 2012-04-15 ENCOUNTER — Other Ambulatory Visit: Payer: Self-pay | Admitting: Family Medicine

## 2012-05-10 ENCOUNTER — Ambulatory Visit: Payer: Self-pay | Admitting: Family Medicine

## 2012-05-10 VITALS — BP 133/83 | HR 80 | Temp 98.0°F | Resp 16 | Ht 72.0 in | Wt 276.2 lb

## 2012-05-10 DIAGNOSIS — J45901 Unspecified asthma with (acute) exacerbation: Secondary | ICD-10-CM

## 2012-05-10 DIAGNOSIS — J45909 Unspecified asthma, uncomplicated: Secondary | ICD-10-CM

## 2012-05-10 DIAGNOSIS — G4700 Insomnia, unspecified: Secondary | ICD-10-CM

## 2012-05-10 DIAGNOSIS — G43909 Migraine, unspecified, not intractable, without status migrainosus: Secondary | ICD-10-CM

## 2012-05-10 MED ORDER — ALBUTEROL SULFATE HFA 108 (90 BASE) MCG/ACT IN AERS: 2.0000 | INHALATION_SPRAY | Freq: Four times a day (QID) | RESPIRATORY_TRACT | Status: DC | PRN

## 2012-05-10 MED ORDER — BUDESONIDE-FORMOTEROL FUMARATE 160-4.5 MCG/ACT IN AERO: 2.0000 | INHALATION_SPRAY | RESPIRATORY_TRACT | Status: DC | PRN

## 2012-05-10 MED ORDER — ALPRAZOLAM 0.5 MG PO TABS: 0.5000 mg | ORAL_TABLET | Freq: Every evening | ORAL | Status: DC | PRN

## 2012-05-10 MED ORDER — SUMATRIPTAN SUCCINATE 100 MG PO TABS: ORAL_TABLET | ORAL | Status: DC

## 2012-05-10 NOTE — Progress Notes (Signed)
[facility] [address] [place] [address] [phone]  Date:  05/10/2012   Name:  Derek Pacheco   DOB:  December 08, 1965   MRN:  [id number]  PCP:  Elvina Sidle, MD    Chief Complaint: Medication Refill   History of Present Illness:  Derek Pacheco is a 47 y.o. very pleasant male patient who presents with the following:  He needs refills of medications today.  He has a history of asthma and uses albuterol as needed.  He has also used symbicort intermittently.  He had recently used symbicort as his symptoms were worse, but he ran out.    He notes that he will sometimes feel SOB and have wheezing.  He does not necessarily notice exercise symptoms.  He is not aware of any particular triggers such as cold, allergies or URI.  He uses his medications episodically.   He would also like to have some more imitrex on hand for occasional migraine HA.  He notes these HA will occur in clusters.    [place] also continues to suffer from insomnia.  He has used lorazepam and klonopin in the past, as well as xanax which actually seemed to work the best of what he has tried.  He has tried [place] as well but it has not worked well.  He has suffered from insomnia "all his life."  He tends to sleep fitfully and will wake up and not be able to go back to sleep.  He sometimes has difficulty getting to sleep as well.   He does not snore.  His GF has observed him and has not noted snoring.   He does not have daytime somnolence.  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    Controlled substance report does reveal that he has been filling clonazepam 1mg , instructions 2 mg qhs,approx every 30 days as directed since 05/2011, prescribers only from [facility] There is no problem list on file for this patient.   Past Medical History  Diagnosis Date  . Asthma     No past surgical history on file.  History  Substance Use Topics  . Smoking status: Current Every Day Smoker --  0.30 packs/day for 10 years    Types: Cigarettes  . Smokeless tobacco: Not on file  . Alcohol Use: Not on file    No family history on file.  No Known Allergies  Medication list has been reviewed and updated.  Current Outpatient Prescriptions on File Prior to Visit  Medication Sig Dispense Refill  . albuterol (PROAIR HFA) 108 (90 BASE) MCG/ACT inhaler Inhale 2 puffs into the lungs every 6 (six) hours as needed for wheezing (cough or wheezing).  1 Inhaler  2  . clonazePAM (KLONOPIN) 1 MG tablet Take 2 tablets (2 mg total) by mouth at bedtime as needed for anxiety.  60 tablet  5  . budesonide-formoterol (SYMBICORT) 160-4.5 MCG/ACT inhaler Inhale 2 puffs into the lungs as needed.  1 Inhaler  11  . fluconazole (DIFLUCAN) 100 MG tablet 2 today then 1 daily for six days  8 tablet  0  . LORazepam (ATIVAN) 2 MG tablet TAKE ONE TABLET BY MOUTH AS NEEDED AT BEDTIME  30 tablet  4  . predniSONE (DELTASONE) 20 MG tablet 3-2-2-1-1-1  10 tablet  0   No current facility-administered medications on file prior to visit.    Review of Systems:  As per HPI- otherwise negative.   Physical Examination: Filed Vitals:  05/10/12 0804  BP: 133/83  Pulse: 80  Temp: 98 F (36.7 C)  Resp: 16   Filed Vitals:   05/10/12 0804  Height: 6' (1.829 m)  Weight: 276 lb 3.2 oz (125.283 kg)   Body mass index is 37.45 kg/(m^2). Ideal Body Weight: Weight in (lb) to have BMI = 25: 183.9  GEN: WDWN, NAD, Non-toxic, A & O x 3, obese HEENT: Atraumatic, Normocephalic. Neck supple. No masses, No LAD.  Bilateral TM wnl, oropharynx normal.  PEERL,EOMI.   Ears and Nose: No external deformity. CV: RRR, No M/G/R. No JVD. No thrill. No extra heart sounds. PULM: CTA B, no wheezes, crackles, rhonchi. No retractions. No resp. distress. No accessory muscle use. ABD: S, NT, ND, +BS. No rebound. No HSM. EXTR: No c/c/e NEURO Normal gait.  PSYCH: Normally interactive. Conversant. Not depressed or anxious appearing.  Calm  demeanor.    Assessment and Plan: 1. Asthma exacerbation  budesonide-formoterol (SYMBICORT) 160-4.5 MCG/ACT inhaler   budesonide-formoterol (SYMBICORT) 160-4.5 MCG/ACT inhaler   albuterol (PROAIR HFA) 108 (90 BASE) MCG/ACT inhaler  2. Insomnia  ALPRAZolam (XANAX) 0.5 MG tablet   ALPRAZolam (XANAX) 0.5 MG tablet  3. Migraine, unspecified, without mention of intractable migraine without mention of status migrainosus  SUMAtriptan (IMITREX) 100 MG tablet   SUMAtriptan (IMITREX) 100 MG tablet   Refilled his medications as above.  Decided to try xanax for his insomnia as clonazepam has not been working very well for him.  Advised him that xanax has a different onset, so we will need to go up on his dose gradually and not start out with too much.  He will call with an update, and we can increase to 1 mg if needed  Aliani Caccavale, MD

## 2012-05-10 NOTE — Patient Instructions (Addendum)
Try the alprazolam (xanax) for sleep.  Do not combine with other sedating medications such as you klonopin, lorazepam, ambien, or with alcohol.  Let us know if this is helpful for your sleep- if so we can continue to use it.

## 2012-05-31 ENCOUNTER — Encounter: Payer: Self-pay | Admitting: Family Medicine

## 2012-05-31 DIAGNOSIS — R519 Headache, unspecified: Secondary | ICD-10-CM

## 2012-05-31 DIAGNOSIS — S0180XA Unspecified open wound of other part of head, initial encounter: Secondary | ICD-10-CM

## 2012-05-31 NOTE — Patient Instructions (Signed)
See below for wound care and head injury precautions. Return to the clinic or go to the nearest emergency room if any of your symptoms worsen or new symptoms occur.   WOUND CARE Please return in 5 days to have your stitches/staples removed or sooner if you have concerns. Marland Kitchen Keep area clean and dry for 24 hours. Do not remove bandage, if applied. . After 24 hours, remove bandage and wash wound gently with mild soap and warm water. Reapply a new bandage after cleaning wound, if directed. . Continue daily cleansing with soap and water until stitches/staples are removed. . Do not apply any ointments or creams to the wound while stitches/staples are in place, as this may cause delayed healing. . Notify the office if you experience any of the following signs of infection: Swelling, redness, pus drainage, streaking, fever >101.0 F . Notify the office if you experience excessive bleeding that does not stop after 15-20 minutes of constant, firm pressure.  HEAD INJURY If any of the following occur notify your physician or go to the Hospital Emergency Department: . Increased drowsiness, stupor or loss of consciousness . Restlessness or convulsions (fits) . Paralysis in arms or legs . Temperature above 100 F . Vomiting . Severe headache . Blood or clear fluid dripping from the nose or ears . Stiffness of the neck . Dizziness or blurred vision . Pulsating pain in the eye . Unequal pupils of eye . Personality changes . Any other unusual symptoms PRECAUTIONS . Keep head elevated at all times for the first 24 hours (Elevate mattress if pillow is ineffective) . Do not take tranquilizers, sedatives, narcotics or alcohol . Avoid aspirin. Use only acetaminophen (e.g. Tylenol) or ibuprofen (e.g. Advil) for relief of pain. Follow directions on the bottle for dosage. . Use ice packs for comfort  MEDICATIONS Use medications only as directed by your physician

## 2012-05-31 NOTE — Progress Notes (Signed)
   Patient ID: Derek Pacheco MRN: [id number], DOB: Apr 06, 1965, 47 y.o. Date of Encounter: 05/31/2012, 11:39 AM   PROCEDURE NOTE: Verbal consent obtained.  Risks and benefits of the procedure were explained. Patient made an informed decision to proceed with the procedure. Sterile technique employed. Numbing: Anesthesia obtained with 1% lidocaine with epi 3 cc for local anesthesia.   Cleansed with soap and water. Irrigated. Betadine prep per usual protocol.  Wound explored, no deep structures involved, no foreign bodies.   No galeal involvement.   Wound repaired with # 7 simple interrupted sutures of 6-0 Ethilon.  Hemostasis obtained. Wound cleansed and dressed.  Wound care instructions including precautions covered with patient. Handout given.  Anticipate suture removal in 5 days.   Signed, Eula Listen, PA-C 05/31/2012 11:39 AM

## 2012-05-31 NOTE — Progress Notes (Signed)
Subjective:    Patient ID: Derek Pacheco, male    DOB: May 14, 1965, 47 y.o.   MRN: [id number]  HPI Derek Pacheco is a 47 y.o. male  Taking out door frame this am - about an hour ago - metal portion of door cut L forehead.  No LOC. Slight frontal HA, but no vision change, n/v/lightheadedess/dizziness.  Feels well otherwise.    Last Td - more than 5 years ago - atleast 6 years ago.    No daily blood thinners.   Review of Systems  Constitutional: Negative for fever and chills.  Eyes: Negative for photophobia, pain, discharge, redness and visual disturbance.  Skin: Positive for wound. Negative for rash.  Neurological: Negative for dizziness, syncope, facial asymmetry, speech difficulty and weakness.       Objective:   Physical Exam  Constitutional: He is oriented to person, place, and time. He appears well-developed and well-nourished.  HENT:  Head: Normocephalic. Head is with laceration. Head is without raccoon's eyes, without Battle's sign, without right periorbital erythema and without left periorbital erythema.    Right Ear: No hemotympanum.  Left Ear: No hemotympanum.  Jaw nt, nl opening/closing.   Pulmonary/Chest: Effort normal.  Neurological: He is alert and oriented to person, place, and time. No cranial nerve deficit. Coordination normal.  Nonfocal. No facial weakness.   Skin:  Wound - scalp           Assessment & Plan:  Derek Pacheco is a 47 y.o. male Pain, face  Wound, open, face, initial encounter - Plan: Tdap vaccine greater than or equal to 7yo IM  Wound repaired per procedure note.  No s/sx of concussion at present, but rtc/er precautions discussed and h/o as below. Plan on SR in next 5 days. tdap updated.rtc precautions.   Patient Instructions  See below for wound care and head injury precautions. Return to the clinic or go to the nearest emergency room if any of your symptoms worsen or new symptoms occur.   WOUND CARE Please return in 5 days  to have your stitches/staples removed or sooner if you have concerns. Marland Kitchen Keep area clean and dry for 24 hours. Do not remove bandage, if applied. . After 24 hours, remove bandage and wash wound gently with mild soap and warm water. Reapply a new bandage after cleaning wound, if directed. . Continue daily cleansing with soap and water until stitches/staples are removed. . Do not apply any ointments or creams to the wound while stitches/staples are in place, as this may cause delayed healing. . Notify the office if you experience any of the following signs of infection: Swelling, redness, pus drainage, streaking, fever >101.0 F . Notify the office if you experience excessive bleeding that does not stop after 15-20 minutes of constant, firm pressure.  HEAD INJURY If any of the following occur notify your physician or go to the Hospital Emergency Department: . Increased drowsiness, stupor or loss of consciousness . Restlessness or convulsions (fits) . Paralysis in arms or legs . Temperature above 100 F . Vomiting . Severe headache . Blood or clear fluid dripping from the nose or ears . Stiffness of the neck . Dizziness or blurred vision . Pulsating pain in the eye . Unequal pupils of eye . Personality changes . Any other unusual symptoms PRECAUTIONS . Keep head elevated at all times for the first 24 hours (Elevate mattress if pillow is ineffective) . Do not take tranquilizers, sedatives, narcotics or alcohol . Avoid aspirin. Use  only acetaminophen (e.g. Tylenol) or ibuprofen (e.g. Advil) for relief of pain. Follow directions on the bottle for dosage. . Use ice packs for comfort  MEDICATIONS Use medications only as directed by your physician      This encounter was created in error - please disregard. This encounter was created in error - please disregard.

## 2012-06-01 ENCOUNTER — Telehealth: Payer: Self-pay

## 2012-06-01 DIAGNOSIS — G4700 Insomnia, unspecified: Secondary | ICD-10-CM

## 2012-06-01 NOTE — Telephone Encounter (Signed)
Patient is having trouble with xanax, would like someone to call him.  Best [phone]

## 2012-06-05 ENCOUNTER — Ambulatory Visit: Payer: Self-pay

## 2012-06-05 MED ORDER — ALPRAZOLAM 1 MG PO TABS: 1.0000 mg | ORAL_TABLET | Freq: Every evening | ORAL | Status: DC | PRN

## 2012-06-05 NOTE — Telephone Encounter (Signed)
Called him, what problems is he having? He wants to increase it. He states he wants to take 2 at bedtime to help him sleep. [facility] [place]. I have advised him to continue current dose, but advise if okay to increase. Pended with increased dosage.

## 2012-06-05 NOTE — Telephone Encounter (Signed)
That is fine, we will increase to 1mg  qhs for sleep

## 2012-09-03 ENCOUNTER — Ambulatory Visit (INDEPENDENT_AMBULATORY_CARE_PROVIDER_SITE_OTHER): Payer: BC Managed Care – PPO | Admitting: Family Medicine

## 2012-09-03 VITALS — BP 142/90 | HR 74 | Temp 97.9°F | Resp 16 | Ht 72.5 in | Wt 271.8 lb

## 2012-09-03 DIAGNOSIS — J329 Chronic sinusitis, unspecified: Secondary | ICD-10-CM

## 2012-09-03 DIAGNOSIS — M5432 Sciatica, left side: Secondary | ICD-10-CM

## 2012-09-03 DIAGNOSIS — J45909 Unspecified asthma, uncomplicated: Secondary | ICD-10-CM

## 2012-09-03 DIAGNOSIS — M5430 Sciatica, unspecified side: Secondary | ICD-10-CM

## 2012-09-03 DIAGNOSIS — G4700 Insomnia, unspecified: Secondary | ICD-10-CM

## 2012-09-03 DIAGNOSIS — J4530 Mild persistent asthma, uncomplicated: Secondary | ICD-10-CM

## 2012-09-03 MED ORDER — PREDNISONE 20 MG PO TABS: ORAL_TABLET | ORAL | Status: DC

## 2012-09-03 MED ORDER — ALPRAZOLAM 1 MG PO TABS: 1.0000 mg | ORAL_TABLET | Freq: Every evening | ORAL | Status: DC | PRN

## 2012-09-03 MED ORDER — AZITHROMYCIN 250 MG PO TABS: ORAL_TABLET | ORAL | Status: DC

## 2012-09-03 NOTE — Patient Instructions (Addendum)
Asthma, Acute Bronchospasm Your exam shows you have asthma, or acute bronchospasm that acts like asthma. Bronchospasm means your air passages become narrowed. These conditions are due to inflammation and airway spasm that cause narrowing of the bronchial tubes in the lungs. This causes you to have wheezing and shortness of breath. POSSIBLE TRIGGERS  Animal dander from the skin, hair, or feathers of animals.  Dust mites contained in house dust.  Cockroaches.  Pollen from trees or grass.  Mold.  Cigarette or tobacco smoke.  Air pollutants such as dust, household cleaners, hair sprays, aerosol sprays, paint fumes, strong chemicals, or strong odors.  Cold air or weather changes. Cold air may cause inflammation. Winds increase molds and pollens in the air.  Strong emotions such as crying or laughing hard.  Stress.  Certain medicines such as aspirin or beta-blockers.  Sulfites in such foods and drinks as dried fruits and wine.  Infections or inflammatory conditions such as a flu, cold, or inflammation of the nasal membranes (rhinitis).  Gastroesophageal reflux disease (GERD). GERD is a condition where stomach acid backs up into your throat (esophagus).  Exercise or strenous activity. TREATMENT  Treatment is aimed at making the narrowed airways larger. Mild asthma or bronchospasm is usually controlled with inhaled medicines. Albuterol is a common medicine that you breathe in to open spastic or narrowed airways. Steroid medicine is also used to reduce the inflammation when an attack is moderate or severe. Antibiotics are only used if a bacterial infection is present.  HOME CARE INSTRUCTIONS   Rest.  Drink plenty of liquids. This helps the mucus to remain thin and easily coughed up. Do not use caffeine or alcohol.  Do not smoke. Avoid being exposed to secondhand smoke.  You play a critical role in keeping yourself in good health. Avoid exposure to things that cause you to wheeze.  Avoid exposure to things that cause you to have breathing problems. Keep your medicines up-to-date and available. Carefully follow your caregiver's treatment plan.  When pollen or pollution is bad, keep windows closed and use an air conditioner or go to places with air conditioning.  Take your medicine exactly as prescribed.  Asthma requires careful medical attention. See your caregiver for follow-up as advised. If you are more than [redacted] weeks pregnant and you were prescribed any new medicines, let your obstetrician know about the visit and how you are doing. Arrange a recheck. SEEK IMMEDIATE MEDICAL CARE IF:   You are getting worse.  You have trouble breathing. If severe, call your local emergency services 911 in [place]  You develop chest pain or discomfort.  You are throwing up or not drinking fluids.  You are coughing up yellow, green, brown, or bloody sputum.  You have a fever or persistent symptoms for more than 2 3 days.  You have a fever and your symptoms suddenly get worse.  You have trouble swallowing. MAKE SURE YOU:   Understand these instructions.  Will watch your condition.  Will get help right away if you are not doing well or get worse. Document Released: 06/30/2006 Document Revised: 03/01/2012 Document Reviewed: 02/27/2007 [facility] Patient Information 2014 [place], [place]. Sinusitis Sinusitis is redness, soreness, and swelling (inflammation) of the paranasal sinuses. Paranasal sinuses are air pockets within the bones of your face (beneath the eyes, the middle of the forehead, or above the eyes). In healthy paranasal sinuses, mucus is able to drain out, and air is able to circulate through them by way of your nose. However, when  your paranasal sinuses are inflamed, mucus and air can become trapped. This can allow bacteria and other germs to grow and cause infection. Sinusitis can develop quickly and last only a short time (acute) or continue over a long period (chronic).  Sinusitis that lasts for more than 12 weeks is considered chronic.  CAUSES  Causes of sinusitis include:  Allergies.  Structural abnormalities, such as displacement of the cartilage that separates your nostrils (deviated septum), which can decrease the air flow through your nose and sinuses and affect sinus drainage.  Functional abnormalities, such as when the small hairs (cilia) that line your sinuses and help remove mucus do not work properly or are not present. SYMPTOMS  Symptoms of acute and chronic sinusitis are the same. The primary symptoms are pain and pressure around the affected sinuses. Other symptoms include:  Upper toothache.  Earache.  Headache.  Bad breath.  Decreased sense of smell and taste.  A cough, which worsens when you are lying flat.  Fatigue.  Fever.  Thick drainage from your nose, which often is green and may contain pus (purulent).  Swelling and warmth over the affected sinuses. DIAGNOSIS  Your caregiver will perform a physical exam. During the exam, your caregiver may:  Look in your nose for signs of abnormal growths in your nostrils (nasal polyps).  Tap over the affected sinus to check for signs of infection.  View the inside of your sinuses (endoscopy) with a special imaging device with a light attached (endoscope), which is inserted into your sinuses. If your caregiver suspects that you have chronic sinusitis, one or more of the following tests may be recommended:  Allergy tests.  Nasal culture A sample of mucus is taken from your nose and sent to a lab and screened for bacteria.  Nasal cytology A sample of mucus is taken from your nose and examined by your caregiver to determine if your sinusitis is related to an allergy. TREATMENT  Most cases of acute sinusitis are related to a viral infection and will resolve on their own within 10 days. Sometimes medicines are prescribed to help relieve symptoms (pain medicine, decongestants, nasal  steroid sprays, or saline sprays).  However, for sinusitis related to a bacterial infection, your caregiver will prescribe antibiotic medicines. These are medicines that will help kill the bacteria causing the infection.  Rarely, sinusitis is caused by a fungal infection. In theses cases, your caregiver will prescribe antifungal medicine. For some cases of chronic sinusitis, surgery is needed. Generally, these are cases in which sinusitis recurs more than 3 times per year, despite other treatments. HOME CARE INSTRUCTIONS   Drink plenty of water. Water helps thin the mucus so your sinuses can drain more easily.  Use a humidifier.  Inhale steam 3 to 4 times a day (for example, sit in the bathroom with the shower running).  Apply a warm, moist washcloth to your face 3 to 4 times a day, or as directed by your caregiver.  Use saline nasal sprays to help moisten and clean your sinuses.  Take over-the-counter or prescription medicines for pain, discomfort, or fever only as directed by your caregiver. SEEK IMMEDIATE MEDICAL CARE IF:  You have increasing pain or severe headaches.  You have nausea, vomiting, or drowsiness.  You have swelling around your face.  You have vision problems.  You have a stiff neck.  You have difficulty breathing. MAKE SURE YOU:   Understand these instructions.  Will watch your condition.  Will get help right away  if you are not doing well or get worse. Document Released: 03/15/2005 Document Revised: 06/07/2011 Document Reviewed: 03/30/2011 [facility] Patient Information 2014 [place], [place]. Sciatica Sciatica is pain, weakness, numbness, or tingling along the path of the sciatic nerve. The nerve starts in the lower back and runs down the back of each leg. The nerve controls the muscles in the lower leg and in the back of the knee, while also providing sensation to the back of the thigh, lower leg, and the sole of your foot. Sciatica is a symptom of another  medical condition. For instance, nerve damage or certain conditions, such as a herniated disk or bone spur on the spine, pinch or put pressure on the sciatic nerve. This causes the pain, weakness, or other sensations normally associated with sciatica. Generally, sciatica only affects one side of the body. CAUSES   Herniated or slipped disc.  Degenerative disk disease.  A pain disorder involving the narrow muscle in the buttocks (piriformis syndrome).  Pelvic injury or fracture.  Pregnancy.  Tumor (rare). SYMPTOMS  Symptoms can vary from mild to very severe. The symptoms usually travel from the low back to the buttocks and down the back of the leg. Symptoms can include:  Mild tingling or dull aches in the lower back, leg, or hip.  Numbness in the back of the calf or sole of the foot.  Burning sensations in the lower back, leg, or hip.  Sharp pains in the lower back, leg, or hip.  Leg weakness.  Severe back pain inhibiting movement. These symptoms may get worse with coughing, sneezing, laughing, or prolonged sitting or standing. Also, being overweight may worsen symptoms. DIAGNOSIS  Your caregiver will perform a physical exam to look for common symptoms of sciatica. He or she may ask you to do certain movements or activities that would trigger sciatic nerve pain. Other tests may be performed to find the cause of the sciatica. These may include:  Blood tests.  X-rays.  Imaging tests, such as an MRI or CT scan. TREATMENT  Treatment is directed at the cause of the sciatic pain. Sometimes, treatment is not necessary and the pain and discomfort goes away on its own. If treatment is needed, your caregiver may suggest:  Over-the-counter medicines to relieve pain.  Prescription medicines, such as anti-inflammatory medicine, muscle relaxants, or narcotics.  Applying heat or ice to the painful area.  Steroid injections to lessen pain, irritation, and inflammation around the  nerve.  Reducing activity during periods of pain.  Exercising and stretching to strengthen your abdomen and improve flexibility of your spine. Your caregiver may suggest losing weight if the extra weight makes the back pain worse.  Physical therapy.  Surgery to eliminate what is pressing or pinching the nerve, such as a bone spur or part of a herniated disk. HOME CARE INSTRUCTIONS   Only take over-the-counter or prescription medicines for pain or discomfort as directed by your caregiver.  Apply ice to the affected area for 20 minutes, 3 4 times a day for the first 48 72 hours. Then try heat in the same way.  Exercise, stretch, or perform your usual activities if these do not aggravate your pain.  Attend physical therapy sessions as directed by your caregiver.  Keep all follow-up appointments as directed by your caregiver.  Do not wear high heels or shoes that do not provide proper support.  Check your mattress to see if it is too soft. A firm mattress may lessen your pain and discomfort.  SEEK IMMEDIATE MEDICAL CARE IF:   You lose control of your bowel or bladder (incontinence).  You have increasing weakness in the lower back, pelvis, buttocks, or legs.  You have redness or swelling of your back.  You have a burning sensation when you urinate.  You have pain that gets worse when you lie down or awakens you at night.  Your pain is worse than you have experienced in the past.  Your pain is lasting longer than 4 weeks.  You are suddenly losing weight without reason. MAKE SURE YOU:  Understand these instructions.  Will watch your condition.  Will get help right away if you are not doing well or get worse. Document Released: 03/09/2001 Document Revised: 09/14/2011 Document Reviewed: 07/25/2011 [facility] Patient Information 2014 [place], [place].

## 2012-09-03 NOTE — Progress Notes (Signed)
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 Needs refill on alprazolam for insomnia.  Doing well 2. Sinus congestion for a week with wheezing 3. Left lateral leg ache from above hip down to side of calf x 3 months  Objective:  NAD Bilateral cerumen impaction Chest:  Bilateral wheezes Nose:  m-p nasal discharge SLR and movement  Of left leg is normal  Assessment:  1. Insomnia 2. Sinus infection 3. RAD 4. Sciatica  Plan:

## 2012-09-16 ENCOUNTER — Telehealth: Payer: Self-pay

## 2012-09-16 NOTE — Telephone Encounter (Signed)
Has a yeast infection in mouth from prednisone, he said that it has happened before when taking prednisone. Would like to know if Dr L can call him in a mouthwash and antibiotic that he has had in the past to clear it up. He will be out of town starting Monday so would like this as soon as possible.  [id number] - [place], [place] - [id number]

## 2012-09-18 MED ORDER — NYSTATIN 100000 UNIT/ML MT SUSP: 500000.0000 [IU] | Freq: Four times a day (QID) | OROMUCOSAL | Status: DC

## 2012-09-18 NOTE — Telephone Encounter (Signed)
Nystatin swish and swallow sent to pharmacy

## 2012-09-18 NOTE — Telephone Encounter (Signed)
Pended dukes please advise

## 2013-02-19 ENCOUNTER — Ambulatory Visit (INDEPENDENT_AMBULATORY_CARE_PROVIDER_SITE_OTHER): Payer: BC Managed Care – PPO | Admitting: Family Medicine

## 2013-02-19 VITALS — BP 110/80 | HR 98 | Temp 98.0°F | Resp 16 | Ht 71.5 in | Wt 272.8 lb

## 2013-02-19 DIAGNOSIS — L989 Disorder of the skin and subcutaneous tissue, unspecified: Secondary | ICD-10-CM

## 2013-02-19 DIAGNOSIS — G4700 Insomnia, unspecified: Secondary | ICD-10-CM

## 2013-02-19 MED ORDER — ALPRAZOLAM 1 MG PO TABS: 1.0000 mg | ORAL_TABLET | Freq: Every evening | ORAL | Status: DC | PRN

## 2013-02-19 NOTE — Progress Notes (Signed)
   Patient ID: Derek Pacheco MRN: [id number], DOB: June 01, 1965, 47 y.o. Date of Encounter: 02/19/2013, 10:01 AM    PROCEDURE NOTE: Verbal consent obtained. Risks and benefits of the procedure were explained to the patient. Patient made an informed decision to proceed with the procedure. Betadine prep per usual protocol. Local anesthesia obtained with 1% lidocaine with epi 1 cc x 2  3 cm punch incision made along lesion x 2 Lesion explored revealing no loculations. Silver nitrate for hemostasis.  Dressed. Wound care instructions including precautions with patient. Patient tolerated the procedure well.    PROCEDURE NOTE: Verbal consent obtained. Sterile technique employed. Numbing: Local anesthesia obtained with 1cc of 1% lidocaine with epinephrine.  Betadine prep per usual protocol.  5 cm punch biopsy taken from right posterior shoulder.  Biopsy placed in pathology transport medium, labeled, and form completed. Hemostasis obtained with 2 SI of 5-0 Prolene.  Wound cleansed and dressed. Wound care instructions including precautions covered with patient.       Signed, Eula Listen, PA-C [facility] [place], [place] [address] [phone] 02/19/2013 10:01 AM

## 2013-02-19 NOTE — Progress Notes (Signed)
Subjective:    Patient ID: Derek Pacheco, male    DOB: 11-13-1965, 47 y.o.   MRN: [id number] This chart was scribed for Elvina Sidle, MD by Valera Castle, ED Scribe. This patient was seen in room 3 and the patient's care was started at 8:39 AM.  HPI Derek Pacheco is a 47 y.o. male who presents to the [facility] requesting a Rx refill.   He reports that his h/o migraines has been stable. He reports they come and go, months at a time. He reports something will set them off, but he is not sure what the stimulus is. He denies h/o smoking.   He reports a skin tag type of bump on his nose from his glasses. He also reports several bumps on his back, sebaceous cysts. He states some itchiness with the bumps on his back. He reports a small bump on his left forearm.  He denies any other symptoms.  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   PCP Elvina Sidle, MD  Patient Active Problem List   Diagnosis Date Noted   Asthma, chronic 05/10/2012   Insomnia 05/10/2012   Past Medical History  Diagnosis Date   Asthma    Insomnia    History reviewed. No pertinent past surgical history. No Known Allergies Prior to Admission medications   Medication Sig Start Date End Date Taking? Authorizing Provider  albuterol (PROAIR HFA) 108 (90 BASE) MCG/ACT inhaler Inhale 2 puffs into the lungs every 6 (six) hours as needed for wheezing (cough or wheezing). 05/10/12 05/10/13 Yes Gwenlyn Found Copland, MD  ALPRAZolam Prudy Feeler) 1 MG tablet Take 1 tablet (1 mg total) by mouth at bedtime as needed for sleep. 09/03/12  Yes Elvina Sidle, MD  budesonide-formoterol [facility]) 160-4.5 MCG/ACT inhaler Inhale 2 puffs into the lungs as needed. 05/10/12  Yes Gwenlyn Found Copland, MD  SUMAtriptan (IMITREX) 100 MG tablet Take 1/2 or 1 tablet as needed for migraine.  Max 200 mg in 24 hours 05/10/12  Yes Gwenlyn Found Copland, MD  azithromycin (ZITHROMAX Z-PAK) 250 MG tablet Take as directed on pack  09/03/12   Elvina Sidle, MD  clonazePAM (KLONOPIN) 1 MG tablet Take 2 tablets (2 mg total) by mouth at bedtime as needed for anxiety. 01/16/12   Phillips Odor, MD  LORazepam (ATIVAN) 2 MG tablet TAKE ONE TABLET BY MOUTH AS NEEDED AT BEDTIME 04/15/12   Elvina Sidle, MD  nystatin (MYCOSTATIN) 100000 UNIT/ML suspension Take 5 mLs (500,000 Units total) by mouth 4 (four) times daily. 09/18/12   Godfrey Pick, PA-C  predniSONE (DELTASONE) 20 MG tablet 2 daily with food 09/03/12   Elvina Sidle, MD   Results for orders placed in visit on 07/22/11  POCT CBC      Result Value Range   WBC 9.8  4.6 - 10.2 K/uL   Lymph, poc 2.5  0.6 - 3.4   POC LYMPH PERCENT 25.2  10 - 50 %L   MID (cbc) 0.7  0 - 0.9   POC MID % 6.9  0 - 12 %M   POC Granulocyte 6.7  2 - 6.9   Granulocyte percent 67.9  37 - 80 %G   RBC 5.19  4.69 - 6.13 M/uL   Hemoglobin 15.2  14.1 - 18.1 g/dL   HCT, POC 40.9  81.1 - 53.7 %   MCV 89.5  80 - 97 fL   MCH, POC 29.3  27 - 31.2 pg   MCHC 32.7  31.8 - 35.4 g/dL   RDW, POC 96.0     Platelet Count, POC 278  142 - 424 K/uL   MPV 10.6  0 - 99.8 fL    Review of Systems  Constitutional: Negative for fever.  Skin: Negative for rash and wound.       Skin tag on nose. Several cysts on back.  Neurological: Positive for headaches (h/o migraines). Negative for dizziness and light-headedness.      Objective:   Physical Exam  Nursing note and vitals reviewed. Constitutional: He is oriented to person, place, and time. He appears well-developed and well-nourished. No distress.  HENT:  Head: Normocephalic and atraumatic.  Right Ear: External ear normal.  Left Ear: External ear normal.  Mouth/Throat: Oropharynx is clear and moist.  Eyes: Conjunctivae and EOM are normal.  Neck: Neck supple. No tracheal deviation present.  Cardiovascular: Normal rate, regular rhythm and normal heart sounds.  Exam reveals no gallop and no friction rub.   No murmur heard. Pulmonary/Chest: Effort normal.  No respiratory distress. He has wheezes. He has no rales.  Musculoskeletal: Normal range of motion.  Neurological: He is alert and oriented to person, place, and time.  Skin: Skin is warm and dry.  Intradermal skin cyst over left scapula x2. Epidermal skin cyst which was treated with liquid nitrogen on the right side of his nose. Supsicous 4 mm irregular melanocytic nevus over the right posterior shoulder.    Psychiatric: He has a normal mood and affect. His behavior is normal.   The right nose, over the bridge, has a 2-3 mm white epidermal cyst which was treated with liquid nitrogen.  See note Re: sebaceous cyst and melanocytic skin lesions. Triage Vitals: BP 110/80   Pulse 98   Temp(Src) 98 F (36.7 C) (Oral)   Resp 16   Ht 5' 11.5" (1.816 m)   Wt 272 lb 12.8 oz (123.741 kg)   BMI 37.52 kg/m2   SpO2 98%     Assessment & Plan:   Insomnia - Plan: ALPRAZolam (XANAX) 1 MG tablet  Skin lesion of back - Plan: Dermatology pathology      I personally performed the services described in this documentation, which was scribed in my presence. The recorded information has been reviewed and is accurate.

## 2013-02-19 NOTE — Patient Instructions (Signed)

## 2013-02-22 ENCOUNTER — Other Ambulatory Visit: Payer: Self-pay | Admitting: Family Medicine

## 2013-02-22 ENCOUNTER — Ambulatory Visit (INDEPENDENT_AMBULATORY_CARE_PROVIDER_SITE_OTHER): Payer: BC Managed Care – PPO | Admitting: Family Medicine

## 2013-02-22 VITALS — BP 128/74 | HR 81 | Temp 98.0°F | Resp 16

## 2013-02-22 DIAGNOSIS — L989 Disorder of the skin and subcutaneous tissue, unspecified: Secondary | ICD-10-CM

## 2013-02-22 MED ORDER — DOXYCYCLINE HYCLATE 100 MG PO TABS: 100.0000 mg | ORAL_TABLET | Freq: Two times a day (BID) | ORAL | Status: DC

## 2013-02-22 NOTE — Progress Notes (Signed)
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 He comes in because he had a positive atypical biopsy of his right shoulder couple weeks ago and he needs a wide excision today.  When patient was here the last time he had 2 other skin lesions:  Sebaceous cyst on the left scapula which were I&D.d. Since that time they become red and more tender.  Patient sciatica and cough have resolved. He's no longer using inhalers.  Objective: Patient is moving easily and is in no acute distress. Right shoulder shows good healing of excision of atypical lesion. Left scapula shows 2 open wounds with half centimeter of surrounding erythema and underlying mild induration, draining small amounts of yellow pus. Straight-leg raising is negative Chest is clear  After 1% Xylocaine with epi was infiltrated around the right shoulder lesion, wide excision was undertaken to a depth of 1/2 cm. Elliptical incision was carried out and then 3 4-0 nylon sutures were used to close the area. One suture was left in the excision previously taken on the lateral margin for orientation purposes on the pathology.  Assessment: Resolved bronchitis. Insomnia under control Atypical melanocytic lesion with wide excision today Mildly infected sebaceous abscesses on the left.  Plan:Skin lesion of back - Plan: Dermatology pathology  Signed, Elvina Sidle, MD

## 2013-03-02 ENCOUNTER — Encounter: Payer: Self-pay | Admitting: Physician Assistant

## 2013-03-02 ENCOUNTER — Ambulatory Visit (INDEPENDENT_AMBULATORY_CARE_PROVIDER_SITE_OTHER): Payer: BC Managed Care – PPO | Admitting: Physician Assistant

## 2013-03-02 VITALS — BP 142/96 | HR 84 | Temp 98.0°F | Resp 16

## 2013-03-02 DIAGNOSIS — L989 Disorder of the skin and subcutaneous tissue, unspecified: Secondary | ICD-10-CM

## 2013-03-02 NOTE — Progress Notes (Signed)
   Subjective:    Patient ID: Derek Pacheco, male    DOB: 12-16-1965, 47 y.o.   MRN: [id number]  HPI Pt presents to clinic for recheck - the Right scapular area where the moles was excised is doing well - just a little itchy.  The area on his left scapula is irritated and draining.  He has been using the abx like he was instructed.  Review of Systems     Objective:   Physical Exam  Vitals reviewed. Constitutional: He is oriented to person, place, and time. He appears well-developed and well-nourished.  HENT:  Head: Normocephalic and atraumatic.  Right Ear: External ear normal.  Left Ear: External ear normal.  Pulmonary/Chest: Effort normal.  Neurological: He is alert and oriented to person, place, and time.  Skin: Skin is warm and dry.     Psychiatric: He has a normal mood and affect. His behavior is normal. Judgment and thought content normal.       Assessment & Plan:  Skin lesion of back 1- right lesion - well healed incision 2- left lesions - both packed and will be rechecked in 3 days - if no necrotic tissue and wound bed still is healthy we might consider loose closure with sutures.  He will continue with the abx.  Benny Lennert PA-C 03/02/2013 4:41 PM

## 2013-03-05 ENCOUNTER — Ambulatory Visit (INDEPENDENT_AMBULATORY_CARE_PROVIDER_SITE_OTHER): Payer: BC Managed Care – PPO | Admitting: Physician Assistant

## 2013-03-05 VITALS — BP 130/90 | HR 97 | Temp 98.1°F | Resp 16 | Ht 72.5 in | Wt 273.6 lb

## 2013-03-05 DIAGNOSIS — L989 Disorder of the skin and subcutaneous tissue, unspecified: Secondary | ICD-10-CM

## 2013-03-05 NOTE — Progress Notes (Signed)
   Subjective:    Patient ID: Derek Pacheco, male    DOB: 1965-12-24, 47 y.o.   MRN: [id number]  HPI Pt presents to clinic for a wound recheck.  The area on his right scapula feels fine and the 2 on his left back feel better.  He has kept the drsg area.  Review of Systems     Objective:   Physical Exam  Vitals reviewed. Constitutional: He is oriented to person, place, and time. He appears well-developed and well-nourished.  HENT:  Head: Normocephalic and atraumatic.  Right Ear: External ear normal.  Left Ear: External ear normal.  Eyes: Conjunctivae are normal.  Neck: Normal range of motion.  Pulmonary/Chest: Effort normal.  Neurological: He is alert and oriented to person, place, and time.  Skin: Skin is warm and dry.  Upper wound - good granulation tissue with scabbing on the edges but no purulent drainage.   Lower wound - good granulation tissue - without purulent drainage from the wound - the wound tracks upper lateral aspect of the wound - it lower wound is repacked and drsg was placed -   Psychiatric: He has a normal mood and affect. His behavior is normal. Judgment and thought content normal.       Assessment & Plan:  Wound back - both incisions appear better - we will recheck in 48h  Benny Lennert PA-C 03/05/2013 4:55 PM

## 2013-03-07 ENCOUNTER — Ambulatory Visit (INDEPENDENT_AMBULATORY_CARE_PROVIDER_SITE_OTHER): Payer: BC Managed Care – PPO | Admitting: Physician Assistant

## 2013-03-07 VITALS — BP 124/82 | HR 82 | Temp 97.9°F | Resp 16

## 2013-03-07 DIAGNOSIS — L989 Disorder of the skin and subcutaneous tissue, unspecified: Secondary | ICD-10-CM

## 2013-03-07 NOTE — Progress Notes (Signed)
   Subjective:    Patient ID: Derek Pacheco, male    DOB: 06-08-1965, 47 y.o.   MRN: [id number]  HPI   Derek Pacheco is a very pleasant 47 yr old male here for wound care.  He had 2 cysts removed from the left upper back on 02/19/13.  Previous notes reviewed.  He states he is doing well today.  No tenderness.  No further drainage.  He has been leaving the area open to air.   Review of Systems  Constitutional: Negative.   Respiratory: Negative.   Cardiovascular: Negative.   Musculoskeletal: Negative.   Skin: Positive for wound.       Objective:   Physical Exam  Vitals reviewed. Constitutional: He is oriented to person, place, and time. He appears well-developed and well-nourished. No distress.  HENT:  Head: Normocephalic and atraumatic.  Eyes: Conjunctivae are normal. No scleral icterus.  Pulmonary/Chest: Effort normal.  Neurological: He is alert and oriented to person, place, and time.  Skin: Skin is warm and dry.     Two healing wounds at left upper back; both wounds now scabbed over; inferior wound still appears to track superiorly and laterally; there is no tenderness; no drainage from either wound  Psychiatric: He has a normal mood and affect. His behavior is normal.       Assessment & Plan:  Skin lesion of back   Derek Pacheco is a very pleasant 47 yr old male here for wound care.  Wounds appear to be healing well.  The are completely nontender and are no longer draining.  I think the best thing to do is let them heal by secondary intention.  Discussed with pt that this may take several weeks until full resolution.  Can continue leaving them open to air.  RTC if concerns.   Loleta Dicker MHS, PA-C [facility] 12/10/20146:56 PM

## 2013-08-12 ENCOUNTER — Ambulatory Visit (INDEPENDENT_AMBULATORY_CARE_PROVIDER_SITE_OTHER): Payer: BC Managed Care – PPO | Admitting: Family Medicine

## 2013-08-12 VITALS — BP 124/86 | HR 81 | Temp 97.9°F | Resp 15 | Ht 72.0 in | Wt 272.2 lb

## 2013-08-12 DIAGNOSIS — G4700 Insomnia, unspecified: Secondary | ICD-10-CM

## 2013-08-12 DIAGNOSIS — D239 Other benign neoplasm of skin, unspecified: Secondary | ICD-10-CM

## 2013-08-12 DIAGNOSIS — D229 Melanocytic nevi, unspecified: Secondary | ICD-10-CM

## 2013-08-12 DIAGNOSIS — D369 Benign neoplasm, unspecified site: Secondary | ICD-10-CM

## 2013-08-12 DIAGNOSIS — L723 Sebaceous cyst: Secondary | ICD-10-CM

## 2013-08-12 MED ORDER — ALPRAZOLAM 1 MG PO TABS: 1.0000 mg | ORAL_TABLET | Freq: Every evening | ORAL | Status: DC | PRN

## 2013-08-12 NOTE — Progress Notes (Addendum)
Subjective:    Patient ID: Derek Pacheco, male    DOB: [birth year], 48 y.o.   MRN: [id number]  Rash Pertinent negatives include no diarrhea, fever or vomiting.   Chief Complaint  Patient presents with  . Medication Refill    xanax  . Rash    right leg, heel, on back where they removed melanoma    This chart was scribed for Delman Cheadle, MD by Derek Pacheco, ED Scribe. This patient was seen in room 2 and the patient's care was started at 8:26 AM.  HPI Comments: [name] is a 48 y.o. male who presents to the [facility] here for a follow up regarding prior biopsy and medication refill.   Pt had removal of right atypical melanocytic nevus on 11/24 with punch biopsy from right posterior shoulder.  Pt was last seen 12/8 in f/u and was healing well at that time.Today pt reports 2 lumps on his right upper back that have progressively grown since that time and are near the previous biopsy site. Pt report lumps were there prior to surgery but have grown. He also report small bumps to right leg and heel without itch or irritation.     Pt is also requesting a medication refill of xanax due to insomnia.  Past Medical History  Diagnosis Date  . Asthma   . Insomnia    No Known Allergies Prior to Admission medications   Medication Sig Start Date End Date Taking? Authorizing Provider  ALPRAZolam Duanne Moron) 1 MG tablet Take 1 tablet (1 mg total) by mouth at bedtime as needed for sleep. 02/19/13  Yes Robyn Haber, MD  SUMAtriptan (IMITREX) 100 MG tablet Take 1/2 or 1 tablet as needed for migraine.  Max 200 mg in 24 hours 05/10/12  Yes Gay Filler Copland, MD  doxycycline (VIBRA-TABS) 100 MG tablet Take 1 tablet (100 mg total) by mouth 2 (two) times daily. 02/22/13   Robyn Haber, MD  LORazepam (ATIVAN) 2 MG tablet TAKE ONE TABLET BY MOUTH AS NEEDED AT BEDTIME 04/15/12   Robyn Haber, MD  predniSONE (DELTASONE) 20 MG tablet 2 daily with food 09/03/12   Robyn Haber, MD     Review of Systems  Constitutional: Negative for fever, chills, activity change and appetite change.  Cardiovascular: Negative for leg swelling.  Gastrointestinal: Negative for nausea, vomiting, abdominal pain, diarrhea and constipation.  Musculoskeletal: Negative for gait problem.  Skin: Negative for color change, rash and wound.  Neurological: Negative for weakness.  Hematological: Negative for adenopathy. Does not bruise/bleed easily.  Psychiatric/Behavioral: Positive for sleep disturbance. The patient is not nervous/anxious.       Objective:   Physical Exam  Nursing note and vitals reviewed. Constitutional: He is oriented to person, place, and time. He appears well-developed and well-nourished. No distress.  HENT:  Head: Normocephalic and atraumatic.  Eyes: EOM are normal.  Neck: Neck supple.  Cardiovascular: Normal rate.   Pulmonary/Chest: Effort normal. No respiratory distress.  Musculoskeletal: Normal range of motion.  Neurological: He is alert and oriented to person, place, and time.  Skin: Skin is warm and dry.  0.5 cm subcutaneous firm poorly defined mobile nodule 3 cm medial to scar from previous nevi. 3 cm inferior to nodule is second similar 0.8 cm nodule - 4 cm medial to previous biopsy. No overlying skin changes.  Macular nevus on lower center of chest over immediately to right of xyloid process 1.5 cm x 1 cm with irregular border and  multiple shades of brown    Psychiatric: He has a normal mood and affect. His behavior is normal.      Assessment & Plan:  Insomnia - Plan: ALPRAZolam (XANAX) 1 MG tablet - refilled - is on chronically w/ no concerns of misuse. Petronila CSD reviewed and appropriate.  Dermoid cyst - good margins on prior reexcision so no concern for reoccurrence of atypical nevus on pts Rt shoulder - suspect these are just incidental development of epidermal cysts - cont to monitor  Sebaceous cyst  Atypical nevus - On anterior chest - will not biopsy in  clinic due to location and size of nevus - reassuring that it has not been changing but still rec dermatology eval.  Pt encouraged to schedule CPE within 6 mos.  Meds ordered this encounter  Medications  . ALPRAZolam (XANAX) 1 MG tablet    Sig: Take 1 tablet (1 mg total) by mouth at bedtime as needed for sleep.    Dispense:  30 tablet    Refill:  5    I personally performed the services described in this documentation, which was scribed in my presence. The recorded information has been reviewed and considered, and addended by me as needed.  Delman Cheadle, MD MPH

## 2014-01-27 ENCOUNTER — Ambulatory Visit (INDEPENDENT_AMBULATORY_CARE_PROVIDER_SITE_OTHER): Payer: BC Managed Care – PPO | Admitting: Emergency Medicine

## 2014-01-27 VITALS — BP 128/86 | HR 84 | Temp 97.5°F | Resp 16 | Ht 73.0 in | Wt 271.4 lb

## 2014-01-27 DIAGNOSIS — J4540 Moderate persistent asthma, uncomplicated: Secondary | ICD-10-CM

## 2014-01-27 DIAGNOSIS — G4700 Insomnia, unspecified: Secondary | ICD-10-CM

## 2014-01-27 DIAGNOSIS — L600 Ingrowing nail: Secondary | ICD-10-CM

## 2014-01-27 MED ORDER — BUDESONIDE-FORMOTEROL FUMARATE 160-4.5 MCG/ACT IN AERO: 2.0000 | INHALATION_SPRAY | Freq: Two times a day (BID) | RESPIRATORY_TRACT | Status: DC

## 2014-01-27 MED ORDER — ALPRAZOLAM 1 MG PO TABS: 1.0000 mg | ORAL_TABLET | Freq: Every evening | ORAL | Status: DC | PRN

## 2014-01-27 MED ORDER — ALBUTEROL SULFATE HFA 108 (90 BASE) MCG/ACT IN AERS: 2.0000 | INHALATION_SPRAY | RESPIRATORY_TRACT | Status: DC | PRN

## 2014-01-27 NOTE — Patient Instructions (Signed)

## 2014-01-27 NOTE — Progress Notes (Signed)
[facility] [address] [place] [address] [phone]  Date:  01/27/2014   Name:  Derek Pacheco   DOB:  1965/05/01   MRN:  [id number]  PCP:  Robyn Haber, MD    Chief Complaint: Medication Refill; lump foot; and Ingrown Toenail   History of Present Illness:  Derek Pacheco is a 48 y.o. very pleasant male patient who presents with the following:  Multiple issues.  Has need for refills of medications for asthma and sleep. Has a mass on the plantar surface of his left foot that is tender and seems to move when he flexes and extends his great toe. Toenails are rolling and not painful No improvement with over the counter medications or other home remedies. Denies other complaint or health concern today.   Patient Active Problem List   Diagnosis Date Noted  . Asthma, chronic 05/10/2012  . Insomnia 05/10/2012    Past Medical History  Diagnosis Date  . Asthma   . Insomnia     History reviewed. No pertinent past surgical history.  History  Substance Use Topics  . Smoking status: Light Tobacco Smoker -- 0.30 packs/day for 10 years    Types: Cigarettes  . Smokeless tobacco: Not on file  . Alcohol Use: Yes    Family History  Problem Relation Age of Onset  . Asthma Mother     No Known Allergies  Medication list has been reviewed and updated.  Current Outpatient Prescriptions on File Prior to Visit  Medication Sig Dispense Refill  . ALPRAZolam (XANAX) 1 MG tablet Take 1 tablet (1 mg total) by mouth at bedtime as needed for sleep. 30 tablet 5  . SUMAtriptan (IMITREX) 100 MG tablet Take 1/2 or 1 tablet as needed for migraine.  Max 200 mg in 24 hours 10 tablet 2   No current facility-administered medications on file prior to visit.    Review of Systems:  As per HPI, otherwise negative.    Physical Examination: Filed Vitals:   01/27/14 0825  BP: 128/86  Pulse: 84  Temp: 97.5 F (36.4 C)  Resp: 16   Filed Vitals:   01/27/14  0825  Height: 6\' 1"  (1.854 m)  Weight: 271 lb 6.4 oz (123.106 kg)   Body mass index is 35.81 kg/(m^2). Ideal Body Weight: Weight in (lb) to have BMI = 25: 189.1   GEN: WDWN, NAD, Non-toxic, Alert & Oriented x 3 HEENT: Atraumatic, Normocephalic.  Ears and Nose: No external deformity. EXTR: No clubbing/cyanosis/edema NEURO: Normal gait.  PSYCH: Normally interactive. Conversant. Not depressed or anxious appearing.  Calm demeanor.  LEFT foot:  Plantar mass that is mobile and tender  Assessment and Plan: Asthma Plantar mass  Signed,  Ellison Carwin, MD

## 2014-02-06 ENCOUNTER — Ambulatory Visit (INDEPENDENT_AMBULATORY_CARE_PROVIDER_SITE_OTHER): Payer: BC Managed Care – PPO

## 2014-02-06 ENCOUNTER — Ambulatory Visit (INDEPENDENT_AMBULATORY_CARE_PROVIDER_SITE_OTHER): Payer: BC Managed Care – PPO | Admitting: Podiatry

## 2014-02-06 ENCOUNTER — Encounter: Payer: Self-pay | Admitting: Podiatry

## 2014-02-06 VITALS — BP 140/99 | HR 81 | Resp 13 | Ht 72.0 in | Wt 275.0 lb

## 2014-02-06 DIAGNOSIS — L03039 Cellulitis of unspecified toe: Secondary | ICD-10-CM

## 2014-02-06 DIAGNOSIS — M722 Plantar fascial fibromatosis: Secondary | ICD-10-CM

## 2014-02-06 DIAGNOSIS — L603 Nail dystrophy: Secondary | ICD-10-CM

## 2014-02-06 DIAGNOSIS — L600 Ingrowing nail: Secondary | ICD-10-CM

## 2014-02-06 MED ORDER — CEPHALEXIN 500 MG PO CAPS: 500.0000 mg | ORAL_CAPSULE | Freq: Three times a day (TID) | ORAL | Status: DC

## 2014-02-06 NOTE — Progress Notes (Signed)
Subjective:    Patient ID: Derek Pacheco, male    DOB: 18-Dec-1965, 48 y.o.   MRN: [id number]  HPI 48 year old male presents the office today for complaints of bilateral ingrown toenails as well as a soft tissue mass in the plantar aspect of his left foot. He states that he has had ingrown toenails to his bilateral big toe nails for a period time and he has tenderness over the nail borders. He denies any recent redness or drainage from around the sites. Pain is particularly increased with pressure. He states he has recently noticed a small mass on the plantar aspect of his left foot that is only painful with direct pressure. He does not have any pain with ambulation at this time. No other complaints at this time.   Review of Systems  Eyes: Positive for pain.  Neurological: Positive for headaches.  All other systems reviewed and are negative.      Objective:   Physical Exam AAO x3, NAD DP/PT pulses palpable bilaterally, CRT less than 3 seconds Protective sensation intact with Simms Weinstein monofilament, vibratory sensation intact, Achilles tendon reflex intact Bilateral medial hallux nail border incurvation with significant ingrowing on the left greater than right. There is tenderness to palpation overlying the nail borders on the medial aspect no pain on the lateral borders. There is lifting of the nail from the underlying nail bed on the left. There is no surrounding erythema or drainage bilaterally. Nails hypertrophic, dystrophic, discolored On the left foot there is a small, firm, mobile soft tissue mass which appears to be attached the medial band of the plantar fascial within the arch of the foot. There is no overlying skin change or open lesions. No heel pain.  MMT 5/5, ROM WNL There is no pinpoint bony tenderness or pain with vibratory sensation. No open lesions. No calf pain, swelling, warmth, erythema.        Assessment & Plan:  48 year old male with bilateral hallux  symptomatic ingrown toenails, left foot plantar fibroma. 1.  Left foot soft tissue mass. -X-rays were obtained and reviewed with the patient. -Treatment options discussed including alternatives, risks, complications. -Discussed with the patient most likely cause of the mass is a plantar fibroma. I discussed with him etiology and likely course of the soft tissue mass. At this time he denies any pain with ambulation and has not increased in size. Healing has pain with direct pressure. I discussed with him possible injections and other treatments. At this time we'll hold off and monitor the area.  Also to consider MRI if becomes increase in pain or size.  2. Bilateral medial border hallux ingrown toenails. -Conservative versus surgical treatment discussed including alternatives, risks, complications. -At this time the patient elects for partial nail avulsions with chemical matricectomy. Risks, occasions was discussed with the patient for which she verbally understood and wished to proceed with the procedure. Under sterile conditions a total of 2.5 mL of a one-to-one mixture of 2% lidocaine plain and 0.5% Marcaine plain was infiltrated in a hallux block fashion bilaterally. The skin was then prepped in sterile fashion and tourniquet were applied. On the left side the medial border of the hallux nail was sharply excised and there is noted to be significant ingrowing of the nail border. After removal of the nail border remaining nails appear to be loose from the underlying nail bed and dystrophic. At this time discussed with the patient possible total nail avulsion due to the onycholysis and onychodystrophy. The nail  was then removed in total. No purulence was identified. On the right side the medial border of the hallux nail was sharply excised making sure to remove the entire offending nail border. No purulence or clinical signs of infection. Phenol was then applied under standard conditions and then irrigated.  On both sides Silvadene was applied followed by a dry sterile dressing. After application a dressing tourniquets were removed and there is noted to be an immediate capillary refill time noted to the digits.  -Nails were sent to [facility] labs for evaluation of onychomycosis. -Post procedure instructions were discussed with the patient for which he verbally understood. -Prescribed Keflex. -Follow-up in 1 week or sooner if any problems are to arise or any change in symptoms. In the meantime call the office with any questions, concerns.

## 2014-02-06 NOTE — Patient Instructions (Addendum)

## 2014-03-04 ENCOUNTER — Encounter: Payer: Self-pay | Admitting: Podiatry

## 2014-07-21 ENCOUNTER — Ambulatory Visit (INDEPENDENT_AMBULATORY_CARE_PROVIDER_SITE_OTHER): Payer: 59 | Admitting: Family Medicine

## 2014-07-21 VITALS — BP 124/86 | HR 76 | Temp 97.5°F | Resp 16 | Ht 72.0 in | Wt 270.0 lb

## 2014-07-21 DIAGNOSIS — G4700 Insomnia, unspecified: Secondary | ICD-10-CM | POA: Diagnosis not present

## 2014-07-21 DIAGNOSIS — J3089 Other allergic rhinitis: Secondary | ICD-10-CM | POA: Diagnosis not present

## 2014-07-21 DIAGNOSIS — G43109 Migraine with aura, not intractable, without status migrainosus: Secondary | ICD-10-CM | POA: Diagnosis not present

## 2014-07-21 MED ORDER — SUMATRIPTAN SUCCINATE 100 MG PO TABS: ORAL_TABLET | ORAL | Status: DC

## 2014-07-21 MED ORDER — ALPRAZOLAM 2 MG PO TABS: 2.0000 mg | ORAL_TABLET | Freq: Every evening | ORAL | Status: DC | PRN

## 2014-07-21 MED ORDER — FLUTICASONE PROPIONATE 50 MCG/ACT NA SUSP: 2.0000 | Freq: Every day | NASAL | Status: DC

## 2014-07-21 MED ORDER — ALPRAZOLAM 1 MG PO TABS: 1.0000 mg | ORAL_TABLET | Freq: Every evening | ORAL | Status: DC | PRN

## 2014-07-21 NOTE — Patient Instructions (Signed)
Insomnia Insomnia is frequent trouble falling and/or staying asleep. Insomnia can be a long term problem or a short term problem. Both are common. Insomnia can be a short term problem when the wakefulness is related to a certain stress or worry. Long term insomnia is often related to ongoing stress during waking hours and/or poor sleeping habits. Overtime, sleep deprivation itself can make the problem worse. Every little thing feels more severe because you are overtired and your ability to cope is decreased. CAUSES   Stress, anxiety, and depression.  Poor sleeping habits.  Distractions such as TV in the bedroom.  Naps close to bedtime.  Engaging in emotionally charged conversations before bed.  Technical reading before sleep.  Alcohol and other sedatives. They may make the problem worse. They can hurt normal sleep patterns and normal dream activity.  Stimulants such as caffeine for several hours prior to bedtime.  Pain syndromes and shortness of breath can cause insomnia.  Exercise late at night.  Changing time zones may cause sleeping problems (jet lag). It is sometimes helpful to have someone observe your sleeping patterns. They should look for periods of not breathing during the night (sleep apnea). They should also look to see how long those periods last. If you live alone or observers are uncertain, you can also be observed at a sleep clinic where your sleep patterns will be professionally monitored. Sleep apnea requires a checkup and treatment. Give your caregivers your medical history. Give your caregivers observations your family has made about your sleep.  SYMPTOMS   Not feeling rested in the morning.  Anxiety and restlessness at bedtime.  Difficulty falling and staying asleep. TREATMENT   Your caregiver may prescribe treatment for an underlying medical disorders. Your caregiver can give advice or help if you are using alcohol or other drugs for self-medication. Treatment  of underlying problems will usually eliminate insomnia problems.  Medications can be prescribed for short time use. They are generally not recommended for lengthy use.  Over-the-counter sleep medicines are not recommended for lengthy use. They can be habit forming.  You can promote easier sleeping by making lifestyle changes such as:  Using relaxation techniques that help with breathing and reduce muscle tension.  Exercising earlier in the day.  Changing your diet and the time of your last meal. No night time snacks.  Establish a regular time to go to bed.  Counseling can help with stressful problems and worry.  Soothing music and white noise may be helpful if there are background noises you cannot remove.  Stop tedious detailed work at least one hour before bedtime. HOME CARE INSTRUCTIONS   Keep a diary. Inform your caregiver about your progress. This includes any medication side effects. See your caregiver regularly. Take note of:  Times when you are asleep.  Times when you are awake during the night.  The quality of your sleep.  How you feel the next day. This information will help your caregiver care for you.  Get out of bed if you are still awake after 15 minutes. Read or do some quiet activity. Keep the lights down. Wait until you feel sleepy and go back to bed.  Keep regular sleeping and waking hours. Avoid naps.  Exercise regularly.  Avoid distractions at bedtime. Distractions include watching television or engaging in any intense or detailed activity like attempting to balance the household checkbook.  Develop a bedtime ritual. Keep a familiar routine of bathing, brushing your teeth, climbing into bed at the same   time each night, listening to soothing music. Routines increase the success of falling to sleep faster.  Use relaxation techniques. This can be using breathing and muscle tension release routines. It can also include visualizing peaceful scenes. You can  also help control troubling or intruding thoughts by keeping your mind occupied with boring or repetitive thoughts like the old concept of counting sheep. You can make it more creative like imagining planting one beautiful flower after another in your backyard garden.  During your day, work to eliminate stress. When this is not possible use some of the previous suggestions to help reduce the anxiety that accompanies stressful situations. MAKE SURE YOU:   Understand these instructions.  Will watch your condition.  Will get help right away if you are not doing well or get worse. Document Released: 03/12/2000 Document Revised: 06/07/2011 Document Reviewed: 04/12/2007 ExitCare Patient Information 2015 [employer]. This information is not intended to replace advice given to you by your health care provider. Make sure you discuss any questions you have with your health care provider.  

## 2014-07-21 NOTE — Progress Notes (Signed)
Patient ID: Derek Pacheco, male   DOB: [birth year], 49 y.o.   MRN: [id number]   This chart was scribed for Robyn Haber, MD by [facility], medical scribe at [facility].The patient was seen in exam room 13 and the patient's care was started at 9:24 AM.  Patient ID: Derek Pacheco MRN: [id number], DOB: 1965-04-09, 49 y.o. Date of Encounter: 07/21/2014  Primary Physician: Robyn Haber, MD  Chief Complaint:  Chief Complaint  Patient presents with   Medication Refill    xanax and imitrex   HPI:  Derek Pacheco is a 49 y.o. male who presents to [facility] for a medication refill. He needs his Imitrex and xanax for refilled. The Imitrex has been working well he had very short intermittent headaches lasting for a day. He usually feels the migraines coming, his right eye will droop. Pt states his asthma has been well, he does not need his albuterol refilled. Pt complains of sinus pressure which he says is most likely for due to pollen. He works in [employer].   Past Medical History  Diagnosis Date   Asthma    Insomnia    Migraines     Home Meds: Prior to Admission medications   Medication Sig Start Date End Date Taking? Authorizing Provider  albuterol (PROVENTIL HFA;VENTOLIN HFA) 108 (90 BASE) MCG/ACT inhaler Inhale 2 puffs into the lungs every 4 (four) hours as needed for wheezing or shortness of breath (cough, shortness of breath or wheezing.). 01/27/14  Yes Roselee Culver, MD  ALPRAZolam Duanne Moron) 1 MG tablet Take 1 tablet (1 mg total) by mouth at bedtime as needed for sleep. 01/27/14  Yes Roselee Culver, MD  budesonide-formoterol [facility]) 160-4.5 MCG/ACT inhaler Inhale 2 puffs into the lungs 2 (two) times daily. 01/27/14  Yes Roselee Culver, MD  cephALEXin (KEFLEX) 500 MG capsule Take 1 capsule (500 mg total) by mouth 3 (three) times daily. 02/06/14  Yes Trula Slade, DPM  SUMAtriptan (IMITREX) 100 MG tablet Take 1/2 or  1 tablet as needed for migraine.  Max 200 mg in 24 hours 05/10/12  Yes Darreld Mclean, MD   Allergies: No Known Allergies  History   Social History   Marital Status: Significant Other    Spouse Name: N/A   Number of Children: N/A   Years of Education: N/A   Occupational History   Not on file.   Social History Main Topics   Smoking status: Light Tobacco Smoker -- 0.30 packs/day for 10 years    Types: Cigarettes   Smokeless tobacco: Not on file   Alcohol Use: 0.0 oz/week    0 Standard drinks or equivalent per week   Drug Use: Not on file   Sexual Activity: Yes    Birth Control/ Protection: None   Other Topics Concern   Not on file   Social History Narrative    Review of Systems: Constitutional: negative for chills, fever, night sweats, weight changes, or fatigue  HEENT: negative for vision changes, hearing loss, congestion, rhinorrhea, ST, epistaxis, or sinus pressure Cardiovascular: negative for chest pain or palpitations Respiratory: negative for hemoptysis, wheezing, shortness of breath, or cough Abdominal: negative for abdominal pain, nausea, vomiting, diarrhea, or constipation Dermatological: negative for rash Neurologic: negative for headache, dizziness, or syncope All other systems reviewed and are otherwise negative with the exception to those above and in the HPI.  Physical Exam: Blood pressure 124/86, temperature 97.5 F (36.4 C), height 6' (  1.829 m), weight 270 lb (122.471 kg)., Body mass index is 36.61 kg/(m^2). General: Well developed, well nourished, in no acute distress. Head: Normocephalic, atraumatic, eyes without discharge, sclera non-icteric, nares are without discharge. Bilateral auditory canals clear, TM's are without perforation, pearly grey and translucent with reflective cone of light bilaterally. Oral cavity moist, posterior pharynx without exudate, erythema, peritonsillar abscess, or post nasal drip.  Neck: Supple. No thyromegaly. Full  ROM. No lymphadenopathy. Lungs: Clear bilaterally to auscultation without wheezes, rales, or rhonchi. Breathing is unlabored. Heart: RRR with S1 S2. No murmurs, rubs, or gallops appreciated. Abdomen: Soft, non-tender, non-distended with normoactive bowel sounds. No hepatomegaly. No rebound/guarding. No obvious abdominal masses. Msk:  Strength and tone normal for age. Extremities/Skin: Warm and dry. No clubbing or cyanosis. No edema. No rashes or suspicious lesions. Neuro: Alert and oriented X 3. Moves all extremities spontaneously. Gait is normal. CNII-XII grossly in tact. Psych:  Responds to questions appropriately with a normal affect.     ASSESSMENT AND PLAN:  49 y.o. year old male with  This chart was scribed in my presence and reviewed by me personally.    ICD-9-CM ICD-10-CM   1. Insomnia 780.52 G47.00 alprazolam (XANAX) 2 MG tablet     DISCONTINUED: ALPRAZolam (XANAX) 1 MG tablet  2. Migraine with aura and without status migrainosus, not intractable 346.00 G43.109 SUMAtriptan (IMITREX) 100 MG tablet  3. Other allergic rhinitis 477.8 J30.89 fluticasone (FLONASE) 50 MCG/ACT nasal spray     Signed, Robyn Haber, MD  Signed, Robyn Haber, MD 07/21/2014 9:24 AM

## 2014-11-08 ENCOUNTER — Ambulatory Visit (INDEPENDENT_AMBULATORY_CARE_PROVIDER_SITE_OTHER): Payer: 59

## 2014-11-08 ENCOUNTER — Ambulatory Visit (INDEPENDENT_AMBULATORY_CARE_PROVIDER_SITE_OTHER): Payer: 59 | Admitting: Family Medicine

## 2014-11-08 VITALS — BP 130/68 | HR 62 | Temp 98.2°F | Resp 14 | Ht 72.0 in | Wt 265.0 lb

## 2014-11-08 DIAGNOSIS — M25561 Pain in right knee: Secondary | ICD-10-CM

## 2014-11-08 MED ORDER — TRAMADOL-ACETAMINOPHEN 37.5-325 MG PO TABS: 1.0000 | ORAL_TABLET | Freq: Four times a day (QID) | ORAL | Status: DC | PRN

## 2014-11-08 MED ORDER — IBUPROFEN 800 MG PO TABS: ORAL_TABLET | ORAL | Status: DC

## 2014-11-08 NOTE — Progress Notes (Signed)
11/08/2014 at 5:29 PM  Derek Pacheco / DOB: [birth year] / MRN: [id number]  The patient has Asthma, chronic; Insomnia; Ingrown toenail; and Plantar fascial fibromatosis on his problem list.  SUBJECTIVE  Derek Pacheco is a 49 y.o. well appearing male presenting for the chief complaint of right knee pain that started without trauma 4 weeks ago.  The pain is made worse by a period of sitting and then activity.  He denies tightness and swelling of the knee, and reports some popping and clicking, and feels that the knee is weak.  He did injure the knee roughly ten years ago and reports quite a lot of swelling with this injury.  He has tried 800 mg of Ibuprofen for the pain with mild relief.       He  has a past medical history of Asthma; Insomnia; and Migraines.    Medications reviewed and updated by myself where necessary, and exist elsewhere in the encounter.   Derek Pacheco has No Known Allergies. He  reports that he has quit smoking. His smoking use included Cigarettes. He has a 3 pack-year smoking history. He does not have any smokeless tobacco history on file. He reports that he does not drink alcohol or use illicit drugs. He  reports that he currently engages in sexual activity. He reports using the following method of birth control/protection: None. The patient  has no past surgical history on file.  His family history includes Asthma in his mother.  Review of Systems  Constitutional: Negative for fever and chills.  Respiratory: Negative for shortness of breath.   Cardiovascular: Negative for chest pain.  Gastrointestinal: Negative for nausea and abdominal pain.  Genitourinary: Negative.   Musculoskeletal: Positive for joint pain.  Skin: Negative for rash.  Neurological: Negative for dizziness and headaches.    OBJECTIVE  His  height is 6' (1.829 m) and weight is 265 lb (120.203 kg). His oral temperature is 98.2 F (36.8 C). His blood pressure is 130/68 and his pulse is 62. His  respiration is 14 and oxygen saturation is 98%.  The patient's body mass index is 35.93 kg/(m^2).  Physical Exam  Constitutional: He is oriented to person, place, and time. He appears well-developed and well-nourished.  Cardiovascular: Normal rate.   Respiratory: Effort normal.  GI: He exhibits no distension.  Musculoskeletal: Normal range of motion.       Right knee: He exhibits swelling (mild). He exhibits normal range of motion, no effusion, no ecchymosis, no deformity, no laceration, no erythema, normal alignment, no LCL laxity, no bony tenderness and no MCL laxity. Tenderness found. Medial joint line and lateral joint line tenderness noted. No MCL, no LCL and no patellar tendon tenderness noted.       Left knee: Normal.  Neurological: He is alert and oriented to person, place, and time. No cranial nerve deficit. Coordination normal.  Skin: Skin is warm and dry.  Psychiatric: He has a normal mood and affect.   [facility] reading (PRIMARY) by  Dr. Tamala Julian: No osseous abnormality.   No results found for this or any previous visit (from the past 24 hour(s)).  ASSESSMENT & PLAN  Derek Pacheco was seen today for leg pain.  Diagnoses and all orders for this visit:  Right knee pain: Patient opting for orthopedic evaluation.  It is possible there is a small effusion of the knee, however the risk of aspiration and injection is greater than the benefit at this time.  Patient to try a longer course  of NSAIDs while awaiting referral.   -     DG Knee Complete 4 Views Right; Future -     ibuprofen (ADVIL,MOTRIN) 800 MG tablet; Take 1 tab every eight hours for pain. Take with food. -     traMADol-acetaminophen (ULTRACET) 37.5-325 MG per tablet; Take 1 tablet by mouth every 6 (six) hours as needed. -     Ambulatory referral to Orthopedic Surgery   The patient was advised to call or come back to clinic if he does not see an improvement in symptoms, or worsens with the above plan.   Philis Fendt, MHS,  PA-C [facility] 11/08/2014 5:29 PM

## 2014-11-24 NOTE — Progress Notes (Signed)
History and physical examinations reviewed in detail with PA Clark. Xrays reviewed during visit. Agree with assessment and plan. Oluwatosin Higginson Elayne Guerin, M.D. [facility] [address] [place], [place]  [address] [phone] phone [phone] fax

## 2015-01-12 ENCOUNTER — Ambulatory Visit (INDEPENDENT_AMBULATORY_CARE_PROVIDER_SITE_OTHER): Payer: 59 | Admitting: Family Medicine

## 2015-01-12 VITALS — BP 120/80 | HR 74 | Temp 98.1°F | Resp 18 | Ht 71.5 in | Wt 269.1 lb

## 2015-01-12 DIAGNOSIS — G4700 Insomnia, unspecified: Secondary | ICD-10-CM

## 2015-01-12 DIAGNOSIS — J3089 Other allergic rhinitis: Secondary | ICD-10-CM | POA: Diagnosis not present

## 2015-01-12 DIAGNOSIS — G43109 Migraine with aura, not intractable, without status migrainosus: Secondary | ICD-10-CM

## 2015-01-12 DIAGNOSIS — J4530 Mild persistent asthma, uncomplicated: Secondary | ICD-10-CM

## 2015-01-12 MED ORDER — SUMATRIPTAN SUCCINATE 100 MG PO TABS: ORAL_TABLET | ORAL | Status: DC

## 2015-01-12 MED ORDER — BUDESONIDE-FORMOTEROL FUMARATE 160-4.5 MCG/ACT IN AERO: 2.0000 | INHALATION_SPRAY | Freq: Two times a day (BID) | RESPIRATORY_TRACT | Status: DC

## 2015-01-12 MED ORDER — ALPRAZOLAM 2 MG PO TABS: 2.0000 mg | ORAL_TABLET | Freq: Every evening | ORAL | Status: DC | PRN

## 2015-01-12 MED ORDER — ALBUTEROL SULFATE HFA 108 (90 BASE) MCG/ACT IN AERS: 2.0000 | INHALATION_SPRAY | RESPIRATORY_TRACT | Status: DC | PRN

## 2015-01-12 MED ORDER — FLUTICASONE PROPIONATE 50 MCG/ACT NA SUSP: 2.0000 | Freq: Every day | NASAL | Status: DC

## 2015-01-12 NOTE — Progress Notes (Signed)
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 He comes in for refill on his Xanax for sleep. It still working well for him. He's tried other medications which have not worked. He does not have any problems waking up. He does a lot of traveling and is out of town quite a bit.   patient also has a problem with headaches. These began when he was 22. The involved right side of his head and usually last day or so. The relief by getting a quiet place. He has no aura but he does have a prodrome that allows him take the Imitrex when he needs it. He's not had any severe headaches in years. The Imitrex continues to work well for him.   he also has a history of asthma and his breathing is been fine. He does not take his asthma medicine all the time because he's not had significant problems with it.  Objective:BP 120/80 mmHg  Pulse 74  Temp(Src) 98.1 F (36.7 C) (Oral)  Resp 18  Ht 5' 11.5" (1.816 m)  Wt 269 lb 2 oz (122.074 kg)  BMI 37.02 kg/m2  SpO2 96% HEENT: Unremarkable Respirations normal.  We discussed his problems above without doing formal exam.  Assessment: Stable on current medications  Plan: This chart was scribed in my presence and reviewed by me personally.    ICD-9-CM ICD-10-CM   1. Insomnia 780.52 G47.00 alprazolam (XANAX) 2 MG tablet  2. Other allergic rhinitis 477.8 J30.89 fluticasone (FLONASE) 50 MCG/ACT nasal spray  3. Migraine with aura and without status migrainosus, not intractable 346.00 G43.109 SUMAtriptan (IMITREX) 100 MG tablet  4. Asthma, mild persistent, uncomplicated 681.27 N17.00 albuterol (PROVENTIL HFA;VENTOLIN HFA) 108 (90 BASE) MCG/ACT inhaler     budesonide-formoterol (SYMBICORT) 160-4.5 MCG/ACT inhaler     Signed, Robyn Haber, MD

## 2015-03-21 ENCOUNTER — Ambulatory Visit (INDEPENDENT_AMBULATORY_CARE_PROVIDER_SITE_OTHER): Payer: 59 | Admitting: Family Medicine

## 2015-03-21 ENCOUNTER — Encounter: Payer: Self-pay | Admitting: Family Medicine

## 2015-03-21 VITALS — BP 142/90 | HR 80 | Temp 97.5°F | Resp 16 | Ht 71.5 in | Wt 268.0 lb

## 2015-03-21 DIAGNOSIS — G43109 Migraine with aura, not intractable, without status migrainosus: Secondary | ICD-10-CM

## 2015-03-21 MED ORDER — SUMATRIPTAN SUCCINATE 100 MG PO TABS: ORAL_TABLET | ORAL | Status: DC

## 2015-03-21 MED ORDER — TOPIRAMATE 50 MG PO TABS: 50.0000 mg | ORAL_TABLET | Freq: Every day | ORAL | Status: DC

## 2015-03-21 MED ORDER — PROMETHAZINE HCL 12.5 MG PO TABS: 12.5000 mg | ORAL_TABLET | Freq: Three times a day (TID) | ORAL | Status: DC | PRN

## 2015-03-21 MED ORDER — PREDNISONE 20 MG PO TABS: ORAL_TABLET | ORAL | Status: DC

## 2015-03-21 NOTE — Patient Instructions (Signed)
Cluster Headache Cluster headaches are recognized by their pattern of deep, intense head pain. They normally occur on one side of your head, but they may "switch sides" in subsequent episodes. Typically, cluster headaches:   Are severe in nature.   Occur repeatedly over weeks to months and are followed by periods of no headaches.   Can last from 15 minutes to 3 hours.   Occur at the same time each day, often at night.   Occur several times a day. CAUSES The exact cause of cluster headaches is not known. Alcohol use may be associated with cluster headaches. SIGNS AND SYMPTOMS   Severe pain that begins in or around your eye or temple.   One-sided head pain.   Feeling sick to your stomach (nauseous).   Sensitivity to light.   Runny nose.   Eye redness, tearing, and nasal stuffiness on the side of your head where you are experiencing pain.   Sweaty, pale skin of the face.   Droopy or swollen eyelid.   Restlessness. DIAGNOSIS  Cluster headaches are diagnosed based on symptoms and a physical exam. Your health care provider may order a CT scan or an MRI of your head or lab tests to see if your headaches are caused by other medical conditions.  TREATMENT   Medicines for pain relief and to prevent recurrent attacks. Some people may need a combination of medicines.  Oxygen for pain relief.   Biofeedback programs to help reduce headache pain.  It may be helpful to keep a headache diary. This may help you find a trend for what is triggering your headaches. Your health care provider can develop a treatment plan.  HOME CARE INSTRUCTIONS  During cluster periods:   Follow a regular sleep schedule. Do not vary the amount and time that you sleep from day to day. It is important to stay on the same schedule during a cluster period to help prevent headaches.   Avoid alcohol.   Stop smoking if you smoke.  SEEK MEDICAL CARE IF:  You have any changes from your previous  cluster headaches either in intensity or frequency.   You are not getting relief from medicines you are taking.  SEEK IMMEDIATE MEDICAL CARE IF:   You faint.   You have weakness or numbness, especially on one side of your body or face.   You have double vision.   You have nausea or vomiting that is not relieved within several hours.   You cannot keep your balance or have difficulty talking or walking.   You have neck pain or stiffness.   You have a fever. MAKE SURE YOU:  Understand these instructions.   Will watch your condition.   Will get help right away if you are not doing well or get worse.   This information is not intended to replace advice given to you by your health care provider. Make sure you discuss any questions you have with your health care provider.   Document Released: 03/15/2005 Document Revised: 01/03/2013 Document Reviewed: 10/05/2012 Elsevier Interactive Patient Education 2016 [employer]. Migraine Headache A migraine headache is an intense, throbbing pain on one or both sides of your head. A migraine can last for 30 minutes to several hours. CAUSES  The exact cause of a migraine headache is not always known. However, a migraine may be caused when nerves in the brain become irritated and release chemicals that cause inflammation. This causes pain. Certain things may also trigger migraines, such as:  Alcohol.  Smoking.  Stress.  Menstruation.  Aged cheeses.  Foods or drinks that contain nitrates, glutamate, aspartame, or tyramine.  Lack of sleep.  Chocolate.  Caffeine.  Hunger.  Physical exertion.  Fatigue.  Medicines used to treat chest pain (nitroglycerine), birth control pills, estrogen, and some blood pressure medicines. SIGNS AND SYMPTOMS  Pain on one or both sides of your head.  Pulsating or throbbing pain.  Severe pain that prevents daily activities.  Pain that is aggravated by any physical  activity.  Nausea, vomiting, or both.  Dizziness.  Pain with exposure to bright lights, loud noises, or activity.  General sensitivity to bright lights, loud noises, or smells. Before you get a migraine, you may get warning signs that a migraine is coming (aura). An aura may include:  Seeing flashing lights.  Seeing bright spots, halos, or zigzag lines.  Having tunnel vision or blurred vision.  Having feelings of numbness or tingling.  Having trouble talking.  Having muscle weakness. DIAGNOSIS  A migraine headache is often diagnosed based on:  Symptoms.  Physical exam.  A CT scan or MRI of your head. These imaging tests cannot diagnose migraines, but they can help rule out other causes of headaches. TREATMENT Medicines may be given for pain and nausea. Medicines can also be given to help prevent recurrent migraines.  HOME CARE INSTRUCTIONS  Only take over-the-counter or prescription medicines for pain or discomfort as directed by your health care provider. The use of long-term narcotics is not recommended.  Lie down in a dark, quiet room when you have a migraine.  Keep a journal to find out what may trigger your migraine headaches. For example, write down:  What you eat and drink.  How much sleep you get.  Any change to your diet or medicines.  Limit alcohol consumption.  Quit smoking if you smoke.  Get 7-9 hours of sleep, or as recommended by your health care provider.  Limit stress.  Keep lights dim if bright lights bother you and make your migraines worse. SEEK IMMEDIATE MEDICAL CARE IF:   Your migraine becomes severe.  You have a fever.  You have a stiff neck.  You have vision loss.  You have muscular weakness or loss of muscle control.  You start losing your balance or have trouble walking.  You feel faint or pass out.  You have severe symptoms that are different from your first symptoms. MAKE SURE YOU:   Understand these  instructions.  Will watch your condition.  Will get help right away if you are not doing well or get worse.   This information is not intended to replace advice given to you by your health care provider. Make sure you discuss any questions you have with your health care provider.   Document Released: 03/15/2005 Document Revised: 04/05/2014 Document Reviewed: 11/20/2012 Elsevier Interactive Patient Education [employer].

## 2015-03-21 NOTE — Progress Notes (Signed)
By signing my name below, I, Moises Blood, attest that this documentation has been prepared under the direction and in the presence of Robyn Haber, MD. Electronically Signed: Moises Blood, [place]. 03/21/2015 , 8:30 AM .  Patient was seen in room 11 .   Patient ID: Derek Pacheco MRN: [id number], DOB: 11/13/1965, 49 y.o. Date of Encounter: 03/21/2015  Primary Physician: Robyn Haber, MD  Chief Complaint:  Chief Complaint  Patient presents with  . Migraine    Onset 2 weeks    HPI:  Derek Pacheco is a 49 y.o. male who presents to [facility] complaining of right side migraine that started 2 weeks ago. He was seen previously and was prescribed imitrex for the headaches and xanax for insomnia. He takes imitrex qd and sometimes bid. He denies complications with the medications.   Past Medical History  Diagnosis Date  . Asthma   . Insomnia   . Migraines      Home Meds: Prior to Admission medications   Medication Sig Start Date End Date Taking? Authorizing Provider  albuterol (PROVENTIL HFA;VENTOLIN HFA) 108 (90 BASE) MCG/ACT inhaler Inhale 2 puffs into the lungs every 4 (four) hours as needed for wheezing or shortness of breath (cough, shortness of breath or wheezing.). 01/12/15   Robyn Haber, MD  alprazolam Duanne Moron) 2 MG tablet Take 1 tablet (2 mg total) by mouth at bedtime as needed for sleep. 01/12/15   Robyn Haber, MD  budesonide-formoterol [facility]) 160-4.5 MCG/ACT inhaler Inhale 2 puffs into the lungs 2 (two) times daily. 01/12/15   Robyn Haber, MD  fluticasone (FLONASE) 50 MCG/ACT nasal spray Place 2 sprays into both nostrils daily. 01/12/15   Robyn Haber, MD  ibuprofen (ADVIL,MOTRIN) 800 MG tablet Take 1 tab every eight hours for pain. Take with food. Patient not taking: Reported on 01/12/2015 11/08/14   Tereasa Coop, PA-C  SUMAtriptan (IMITREX) 100 MG tablet Take 1/2 or 1 tablet as needed for migraine.  Max 200 mg in 24  hours 01/12/15   Robyn Haber, MD  traMADol-acetaminophen (ULTRACET) 37.5-325 MG per tablet Take 1 tablet by mouth every 6 (six) hours as needed. Patient not taking: Reported on 01/12/2015 11/08/14   Tereasa Coop, PA-C    Allergies: No Known Allergies  Social History   Social History  . Marital Status: Significant Other    Spouse Name: N/A  . Number of Children: N/A  . Years of Education: N/A   Occupational History  . Not on file.   Social History Main Topics  . Smoking status: Former Smoker -- 0.30 packs/day for 10 years    Types: Cigarettes  . Smokeless tobacco: Not on file  . Alcohol Use: No  . Drug Use: No  . Sexual Activity: Yes    Birth Control/ Protection: None   Other Topics Concern  . Not on file   Social History Narrative     Review of Systems: Constitutional: negative for fever, chills, night sweats, weight changes, or fatigue  HEENT: negative for hearing loss, congestion, rhinorrhea, ST, epistaxis, or sinus pressure; positive for photophobia Cardiovascular: negative for chest pain or palpitations Respiratory: negative for hemoptysis, wheezing, shortness of breath, or cough Abdominal: negative for abdominal pain, nausea, vomiting, diarrhea, or constipation Dermatological: negative for rash Neurologic: negative for dizziness, or syncope; positive for headaches All other systems reviewed and are otherwise negative with the exception to those above and in the HPI.  Physical Exam: Blood pressure 142/90, pulse 80, temperature 97.5  F (36.4 C), temperature source Oral, resp. rate 16, height 5' 11.5" (1.816 m), weight 268 lb (121.564 kg), SpO2 98 %., Body mass index is 36.86 kg/(m^2). General: Well developed, well nourished, in no acute distress. Head: Normocephalic, atraumatic, eyes without discharge, sclera non-icteric, nares are without discharge. Bilateral auditory canals clear, TM's are without perforation, pearly grey and translucent with reflective cone  of light bilaterally. Oral cavity moist, posterior pharynx without exudate, erythema, peritonsillar abscess, or post nasal drip.  Neck: Supple. No thyromegaly. Full ROM. No lymphadenopathy. Lungs: Clear bilaterally to auscultation without wheezes, rales, or rhonchi. Breathing is unlabored. Heart: RRR with S1 S2. No murmurs, rubs, or gallops appreciated. Msk:  Strength and tone normal for age. Extremities/Skin: Warm and dry. No clubbing or cyanosis. No edema. No rashes or suspicious lesions. Neuro: Alert and oriented X 3. Moves all extremities spontaneously. Gait is normal. CNII-XII grossly in tact. Psych:  Responds to questions appropriately with a normal affect.    ASSESSMENT AND PLAN:  49 y.o. year old male with cluster/migraine syndrome This chart was scribed in my presence and reviewed by me personally.    ICD-9-CM ICD-10-CM   1. Migraine with aura and without status migrainosus, not intractable 346.00 G43.109 SUMAtriptan (IMITREX) 100 MG tablet     predniSONE (DELTASONE) 20 MG tablet     promethazine (PHENERGAN) 12.5 MG tablet     topiramate (TOPAMAX) 50 MG tablet      Signed, Robyn Haber, MD 03/21/2015 8:30 AM

## 2015-03-26 ENCOUNTER — Telehealth: Payer: Self-pay

## 2015-03-26 NOTE — Telephone Encounter (Signed)
Dr Leighton Parody you be willing to write an appeal letter.

## 2015-03-26 NOTE — Telephone Encounter (Signed)
Pt was prescribed migraine pills and he only received 9 from the pharmacy due to insurance monitoring. He said Dr. Carlean Jews would have to call the insurance company to state he needs more for him to receive more.  Please advise   [phone]

## 2015-03-27 NOTE — Telephone Encounter (Signed)
I was unable to get insurance to approve more meds.  I can refer to neurology and they may have some other ideas on where to go from here.

## 2015-03-27 NOTE — Telephone Encounter (Signed)
Pt called back checking on this message.  Could another provider work on this? CB # [phone] (M)

## 2015-03-31 NOTE — Telephone Encounter (Signed)
Called pt and advised message from provider on their voicemail. I advised pt to call back if he wanted referral to neurology.

## 2015-05-14 ENCOUNTER — Ambulatory Visit (INDEPENDENT_AMBULATORY_CARE_PROVIDER_SITE_OTHER): Payer: BLUE CROSS/BLUE SHIELD | Admitting: Family Medicine

## 2015-05-14 VITALS — BP 158/101 | HR 88 | Temp 98.2°F | Resp 16 | Ht 71.0 in | Wt 277.0 lb

## 2015-05-14 DIAGNOSIS — J209 Acute bronchitis, unspecified: Secondary | ICD-10-CM

## 2015-05-14 DIAGNOSIS — G4700 Insomnia, unspecified: Secondary | ICD-10-CM | POA: Diagnosis not present

## 2015-05-14 DIAGNOSIS — G43109 Migraine with aura, not intractable, without status migrainosus: Secondary | ICD-10-CM

## 2015-05-14 DIAGNOSIS — J4530 Mild persistent asthma, uncomplicated: Secondary | ICD-10-CM

## 2015-05-14 MED ORDER — AMOXICILLIN-POT CLAVULANATE 875-125 MG PO TABS: 1.0000 | ORAL_TABLET | Freq: Two times a day (BID) | ORAL | Status: DC

## 2015-05-14 MED ORDER — PREDNISONE 20 MG PO TABS: ORAL_TABLET | ORAL | Status: DC

## 2015-05-14 MED ORDER — ALBUTEROL SULFATE (2.5 MG/3ML) 0.083% IN NEBU
2.5000 mg | INHALATION_SOLUTION | Freq: Once | RESPIRATORY_TRACT | Status: AC
  Administered 2015-05-14: 2.5 mg via RESPIRATORY_TRACT

## 2015-05-14 MED ORDER — TRAZODONE HCL 100 MG PO TABS: 100.0000 mg | ORAL_TABLET | Freq: Every day | ORAL | Status: DC

## 2015-05-14 MED ORDER — BUDESONIDE-FORMOTEROL FUMARATE 160-4.5 MCG/ACT IN AERO: 2.0000 | INHALATION_SPRAY | Freq: Two times a day (BID) | RESPIRATORY_TRACT | Status: DC

## 2015-05-14 MED ORDER — IPRATROPIUM BROMIDE 0.02 % IN SOLN
0.5000 mg | Freq: Once | RESPIRATORY_TRACT | Status: AC
  Administered 2015-05-14: 0.5 mg via RESPIRATORY_TRACT

## 2015-05-14 MED ORDER — ALBUTEROL SULFATE HFA 108 (90 BASE) MCG/ACT IN AERS: 2.0000 | INHALATION_SPRAY | RESPIRATORY_TRACT | Status: DC | PRN

## 2015-05-14 NOTE — Patient Instructions (Signed)

## 2015-05-14 NOTE — Progress Notes (Signed)
Patient ID: Derek Pacheco MRN: [id number], DOB: 05-01-1965, 50 y.o. Date of Encounter: 05/14/2015, 2:01 PM  Primary Physician: Robyn Haber, MD  Chief Complaint:  Chief Complaint  Patient presents with  . Sinusitis    trouble breathingx 3-4 days  . chest congestion    x 3 days    HPI: 50 y.o. year old male presents with a 4 day history of nasal congestion, post nasal drip, sore throat, and cough. Mild sinus pressure. Afebrile. No chills. Nasal congestion thick and green/yellow. Cough is productive of green/yellow sputum and not associated with time of day. Ears feel full, leading to sensation of muffled hearing. Has tried OTC cold preps without success. No GI complaints.   No sick contacts, recent antibiotics, or recent travels.   No leg trauma, sedentary periods, h/o cancer.    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  Past Medical History  Diagnosis Date  . Asthma   . Insomnia   . Migraines      Home Meds: Prior to Admission medications   Medication Sig Start Date End Date Taking? Authorizing Provider  albuterol (PROVENTIL HFA;VENTOLIN HFA) 108 (90 BASE) MCG/ACT inhaler Inhale 2 puffs into the lungs every 4 (four) hours as needed for wheezing or shortness of breath (cough, shortness of breath or wheezing.). 01/12/15  Yes Robyn Haber, MD  alprazolam Duanne Moron) 2 MG tablet Take 1 tablet (2 mg total) by mouth at bedtime as needed for sleep. 01/12/15  Yes Robyn Haber, MD  budesonide-formoterol [facility]) 160-4.5 MCG/ACT inhaler Inhale 2 puffs into the lungs 2 (two) times daily. 01/12/15  Yes Robyn Haber, MD  fluticasone (FLONASE) 50 MCG/ACT nasal spray Place 2 sprays into both nostrils daily. 01/12/15  Yes Robyn Haber, MD  promethazine (PHENERGAN) 12.5 MG tablet Take 1 tablet (12.5 mg total) by mouth every 8 (eight) hours as needed for nausea or vomiting. 03/21/15  Yes Robyn Haber, MD  SUMAtriptan (IMITREX) 100 MG tablet Take 1/2 or 1 tablet as  needed for migraine.  Max 200 mg in 24 hours 03/21/15  Yes Robyn Haber, MD  topiramate (TOPAMAX) 50 MG tablet Take 1 tablet (50 mg total) by mouth at bedtime. 03/21/15  Yes Robyn Haber, MD  predniSONE (DELTASONE) 20 MG tablet Two daily with food Patient not taking: Reported on 05/14/2015 03/21/15   Robyn Haber, MD    Allergies: No Known Allergies  Social History   Social History  . Marital Status: Significant Other    Spouse Name: N/A  . Number of Children: N/A  . Years of Education: N/A   Occupational History  . Not on file.   Social History Main Topics  . Smoking status: Former Smoker -- 0.30 packs/day for 10 years    Types: Cigarettes  . Smokeless tobacco: Not on file  . Alcohol Use: No  . Drug Use: No  . Sexual Activity: Yes    Birth Control/ Protection: None   Other Topics Concern  . Not on file   Social History Narrative     Review of Systems: Constitutional: negative for chills, fever, night sweats or weight changes Cardiovascular: negative for chest pain or palpitations Respiratory: negative for hemoptysis, wheezing, or shortness of breath Abdominal: negative for abdominal pain, nausea, vomiting or diarrhea Dermatological: negative for rash Neurologic: negative for headache   Physical Exam: Blood pressure 158/101, pulse 88, temperature 98.2 F (36.8 C), temperature source Oral, resp. rate 16, height 5\' 11"  (1.803 m), weight 277 lb (125.646 kg), SpO2 96 %.,  Body mass index is 38.65 kg/(m^2). General: Well developed, well nourished, in no acute distress. Head: Normocephalic, atraumatic, eyes without discharge, sclera non-icteric, nares are congested. Bilateral auditory canals clear, TM's are without perforation, pearly grey with reflective cone of light bilaterally. No sinus TTP. Oral cavity moist, dentition normal. Posterior pharynx with post nasal drip and mild erythema. No peritonsillar abscess or tonsillar exudate. Neck: Supple. No thyromegaly.  Full ROM. No lymphadenopathy. Lungs: Coarse breath sounds bilaterally with wheezes,but no rales, or rhonchi. Breathing is unlabored.  Heart: RRR with S1 S2. No murmurs, rubs, or gallops appreciated. Msk:  Strength and tone normal for age. Extremities: No clubbing or cyanosis. No edema. Neuro: Alert and oriented X 3. Moves all extremities spontaneously. CNII-XII grossly in tact. Psych:  Responds to questions appropriately with a normal affect.   Labs:   ASSESSMENT AND PLAN:  50 y.o. year old male with bronchitis.  Advised to continue smoking cessation -   ICD-9-CM ICD-10-CM   1. Acute bronchitis, unspecified organism 466.0 J20.9 amoxicillin-clavulanate (AUGMENTIN) 875-125 MG tablet     albuterol (PROVENTIL) (2.5 MG/3ML) 0.083% nebulizer solution 2.5 mg     ipratropium (ATROVENT) nebulizer solution 0.5 mg  2. Insomnia 780.52 G47.00 traZODone (DESYREL) 100 MG tablet  3. Asthma, mild persistent, uncomplicated 123456 [id number] albuterol (PROVENTIL HFA;VENTOLIN HFA) 108 (90 Base) MCG/ACT inhaler     budesonide-formoterol (SYMBICORT) 160-4.5 MCG/ACT inhaler  4. Migraine with aura and without status migrainosus, not intractable 346.00 G43.109 predniSONE (DELTASONE) 20 MG tablet   -Tylenol/Motrin prn -Rest/fluids -RTC precautions -RTC 3-5 days if no improvement  Signed, Robyn Haber, MD 05/14/2015 2:01 PM

## 2015-05-31 ENCOUNTER — Ambulatory Visit (INDEPENDENT_AMBULATORY_CARE_PROVIDER_SITE_OTHER): Payer: BLUE CROSS/BLUE SHIELD | Admitting: Family Medicine

## 2015-05-31 VITALS — BP 138/88 | HR 87 | Temp 98.1°F | Resp 18 | Ht 71.0 in | Wt 279.4 lb

## 2015-05-31 DIAGNOSIS — R030 Elevated blood-pressure reading, without diagnosis of hypertension: Secondary | ICD-10-CM | POA: Diagnosis not present

## 2015-05-31 DIAGNOSIS — Z8619 Personal history of other infectious and parasitic diseases: Secondary | ICD-10-CM

## 2015-05-31 DIAGNOSIS — IMO0001 Reserved for inherently not codable concepts without codable children: Secondary | ICD-10-CM

## 2015-05-31 DIAGNOSIS — B370 Candidal stomatitis: Secondary | ICD-10-CM | POA: Diagnosis not present

## 2015-05-31 LAB — POCT GLYCOSYLATED HEMOGLOBIN (HGB A1C): HEMOGLOBIN A1C: 5.7

## 2015-05-31 LAB — GLUCOSE, POCT (MANUAL RESULT ENTRY): POC Glucose: 119 mg/dl — AB (ref 70–99)

## 2015-05-31 MED ORDER — ACYCLOVIR 5 % EX OINT: TOPICAL_OINTMENT | CUTANEOUS | Status: DC

## 2015-05-31 MED ORDER — FLUCONAZOLE 100 MG PO TABS: 100.0000 mg | ORAL_TABLET | Freq: Once | ORAL | Status: DC

## 2015-05-31 NOTE — Patient Instructions (Signed)
Always swish and swallow or swish and spit after using your inhalers  Take the fluconazole 100 mg daily for 3 days  Use the acyclovir as needed for fever blisters  Return if necessary

## 2015-05-31 NOTE — Progress Notes (Signed)
Patient ID: Derek Pacheco, male    DOB: [birth year]  Age: 50 y.o. MRN: [id number]  Chief Complaint  Patient presents with  . Sore Throat    x 1 wk    Subjective:   Patient has been using Symbicort. He has a bad sore throat. Someone told him he probably has thrush. He has not been swishing and swallowing afterwards. He is aware that he is gaining weight and that his blood pressure is running higher. He has a physical exam sometime in the near future and asks not to be placed on medicines until then, that he is going to work to be changed some things. Otherwise he is doing well. Review of systems is unremarkable otherwise. He uses Symbicort and his breathing is been doing okay.  Current allergies, medications, problem list, past/family and social histories reviewed.  Objective:  BP 138/88 mmHg  Pulse 87  Temp(Src) 98.1 F (36.7 C) (Oral)  Resp 18  Ht 5\' 11"  (1.803 m)  Wt 279 lb 6 oz (126.724 kg)  BMI 38.98 kg/m2  SpO2 96%  TMs are normal. Throat erythematous without exudate. It does look like it could be thrush. There is some whitish stuff back on the soft palate. Neck supple without significant nodes. Chest clear. Heart regular without murmurs.  Assessment & Plan:   Assessment: 1. Thrush, oral   2. History of cold sores   3. Elevated blood pressure       Plan: Check her blood sugar because of the thrush to make sure there is no evidence for diabetes. Also asked about HIV, and he says no way. Results for orders placed or performed in visit on 05/31/15  POCT glucose (manual entry)  Result Value Ref Range   POC Glucose 119 (A) 70 - 99 mg/dl  POCT glycosylated hemoglobin (Hb A1C)  Result Value Ref Range   Hemoglobin A1C 5.7     Orders Placed This Encounter  Procedures  . POCT glucose (manual entry)  . POCT glycosylated hemoglobin (Hb A1C)    Meds ordered this encounter  Medications  . acyclovir ointment (ZOVIRAX) 5 %    Sig: Use every 4-6 hours as needed for cold  sores    Dispense:  15 g    Refill:  1  . fluconazole (DIFLUCAN) 100 MG tablet    Sig: Take 1 tablet (100 mg total) by mouth once.    Dispense:  3 tablet    Refill:  0         Patient Instructions  Always swish and swallow or swish and spit after using your inhalers  Take the fluconazole 100 mg daily for 3 days  Use the acyclovir as needed for fever blisters  Return if necessary     Return if symptoms worsen or fail to improve.   Kacey Vicuna, MD 05/31/2015

## 2015-06-27 ENCOUNTER — Emergency Department (HOSPITAL_COMMUNITY): Payer: BLUE CROSS/BLUE SHIELD

## 2015-06-27 ENCOUNTER — Emergency Department (HOSPITAL_COMMUNITY)
Admission: EM | Admit: 2015-06-27 | Discharge: 2015-06-27 | Disposition: A | Discharge status: Home / Self Care | Payer: BLUE CROSS/BLUE SHIELD | Attending: Emergency Medicine | Admitting: Emergency Medicine

## 2015-06-27 ENCOUNTER — Encounter (HOSPITAL_COMMUNITY): Payer: Self-pay

## 2015-06-27 ENCOUNTER — Ambulatory Visit (INDEPENDENT_AMBULATORY_CARE_PROVIDER_SITE_OTHER): Payer: BLUE CROSS/BLUE SHIELD | Admitting: Family Medicine

## 2015-06-27 VITALS — BP 132/88 | HR 103 | Temp 97.5°F | Resp 16 | Ht 71.0 in | Wt 269.0 lb

## 2015-06-27 DIAGNOSIS — Z87891 Personal history of nicotine dependence: Secondary | ICD-10-CM | POA: Diagnosis not present

## 2015-06-27 DIAGNOSIS — R0602 Shortness of breath: Secondary | ICD-10-CM | POA: Diagnosis present

## 2015-06-27 DIAGNOSIS — Z79899 Other long term (current) drug therapy: Secondary | ICD-10-CM | POA: Insufficient documentation

## 2015-06-27 DIAGNOSIS — R062 Wheezing: Secondary | ICD-10-CM

## 2015-06-27 DIAGNOSIS — G43909 Migraine, unspecified, not intractable, without status migrainosus: Secondary | ICD-10-CM | POA: Insufficient documentation

## 2015-06-27 DIAGNOSIS — G4700 Insomnia, unspecified: Secondary | ICD-10-CM | POA: Diagnosis not present

## 2015-06-27 DIAGNOSIS — R0600 Dyspnea, unspecified: Secondary | ICD-10-CM | POA: Diagnosis not present

## 2015-06-27 DIAGNOSIS — J45901 Unspecified asthma with (acute) exacerbation: Secondary | ICD-10-CM | POA: Diagnosis not present

## 2015-06-27 DIAGNOSIS — R079 Chest pain, unspecified: Secondary | ICD-10-CM

## 2015-06-27 DIAGNOSIS — Z7951 Long term (current) use of inhaled steroids: Secondary | ICD-10-CM | POA: Diagnosis not present

## 2015-06-27 HISTORY — DX: Bronchitis, not specified as acute or chronic: J40

## 2015-06-27 LAB — CBC
HEMATOCRIT: 44.4 % (ref 39.0–52.0)
HEMOGLOBIN: 15.1 g/dL (ref 13.0–17.0)
MCH: 29.9 pg (ref 26.0–34.0)
MCHC: 34 g/dL (ref 30.0–36.0)
MCV: 87.9 fL (ref 78.0–100.0)
Platelets: 210 10*3/uL (ref 150–400)
RBC: 5.05 MIL/uL (ref 4.22–5.81)
RDW: 13 % (ref 11.5–15.5)
WBC: 7.2 10*3/uL (ref 4.0–10.5)

## 2015-06-27 LAB — COMPREHENSIVE METABOLIC PANEL
ALBUMIN: 4.5 g/dL (ref 3.5–5.0)
ALT: 37 U/L (ref 17–63)
AST: 34 U/L (ref 15–41)
Alkaline Phosphatase: 86 U/L (ref 38–126)
Anion gap: 13 (ref 5–15)
BUN: 17 mg/dL (ref 6–20)
CHLORIDE: 105 mmol/L (ref 101–111)
CO2: 22 mmol/L (ref 22–32)
CREATININE: 0.98 mg/dL (ref 0.61–1.24)
Calcium: 9.4 mg/dL (ref 8.9–10.3)
GFR calc Af Amer: >60 mL/min (ref >60)
GLUCOSE: 109 mg/dL — AB (ref 65–99)
Potassium: 3.6 mmol/L (ref 3.5–5.1)
Sodium: 140 mmol/L (ref 135–145)
Total Bilirubin: 1.4 mg/dL — ABNORMAL HIGH (ref 0.3–1.2)
Total Protein: 7.5 g/dL (ref 6.5–8.1)

## 2015-06-27 LAB — I-STAT TROPONIN, ED: Troponin i, poc: 0 ng/mL (ref 0.00–0.08)

## 2015-06-27 LAB — LIPASE, BLOOD: LIPASE: 18 U/L (ref 11–51)

## 2015-06-27 MED ORDER — IPRATROPIUM-ALBUTEROL 0.5-2.5 (3) MG/3ML IN SOLN
3.0000 mL | Freq: Once | RESPIRATORY_TRACT | Status: AC
  Administered 2015-06-27: 3 mL via RESPIRATORY_TRACT
  Filled 2015-06-27: qty 3

## 2015-06-27 MED ORDER — IPRATROPIUM BROMIDE 0.02 % IN SOLN: 0.5000 mg | Freq: Once | RESPIRATORY_TRACT | Status: DC

## 2015-06-27 MED ORDER — ALBUTEROL SULFATE (2.5 MG/3ML) 0.083% IN NEBU
2.5000 mg | INHALATION_SOLUTION | Freq: Once | RESPIRATORY_TRACT | Status: DC
Start: 2015-06-27 — End: 2015-06-27

## 2015-06-27 MED ORDER — ALPRAZOLAM 2 MG PO TABS: 2.0000 mg | ORAL_TABLET | Freq: Every evening | ORAL | Status: DC | PRN

## 2015-06-27 NOTE — Progress Notes (Signed)
Patient ID: Derek Pacheco MRN: [id number], DOB: 1965-08-24, 50 y.o. Date of Encounter: 06/27/2015, 3:05 PM  By signing my name below, I, Judithe Modest, attest that this documentation has been prepared under the direction and in the presence of Robyn Haber, MD. Electronically Signed: Judithe Modest, ER Scribe. 06/27/2015. 3:05 PM.  Primary Physician: Robyn Haber, MD  Chief Complaint  Patient presents with  . Shortness of Breath  . Medication Refill    Alprazolam  . Sinus Problem  . Nasal Congestion    HPI: 50 y.o. year old male with history below presents with SOB with exertion for the last week. He is also suffering from sinus congestion and wheezing that has gotten worse over the last week. He has also had a squeezing upper abdominal pain, one time today at 12:30pm and one other time within the last month. He coughs a little a night. He has become progressively more congested over the last week. He does not have any current chest pain. His mother died of HIV. He has no idea what his fathers medical hx is. He quit smoking one month ago after many years of smoking. He uses lung mediations daily.   The pt hangs doors for a living.    Past Medical History  Diagnosis Date  . Asthma   . Insomnia   . Migraines      Home Meds: Prior to Admission medications   Medication Sig Start Date End Date Taking? Authorizing Provider  acyclovir ointment (ZOVIRAX) 5 % Use every 4-6 hours as needed for cold sores 05/31/15  Yes Posey Boyer, MD  albuterol (PROVENTIL HFA;VENTOLIN HFA) 108 (90 Base) MCG/ACT inhaler Inhale 2 puffs into the lungs every 4 (four) hours as needed for wheezing or shortness of breath (cough, shortness of breath or wheezing.). 05/14/15  Yes Robyn Haber, MD  alprazolam Duanne Moron) 2 MG tablet Take 1 tablet (2 mg total) by mouth at bedtime as needed for sleep. 01/12/15  Yes Robyn Haber, MD  budesonide-formoterol [facility]) 160-4.5 MCG/ACT inhaler Inhale 2 puffs  into the lungs 2 (two) times daily. 05/14/15  Yes Robyn Haber, MD  fluticasone [facility]) 50 MCG/ACT nasal spray Place 2 sprays into both nostrils daily. 01/12/15  Yes Robyn Haber, MD  SUMAtriptan (IMITREX) 100 MG tablet Take 1/2 or 1 tablet as needed for migraine.  Max 200 mg in 24 hours 03/21/15  Yes Robyn Haber, MD  traZODone (DESYREL) 100 MG tablet Take 1 tablet (100 mg total) by mouth at bedtime. 05/14/15  Yes Robyn Haber, MD  amoxicillin-clavulanate (AUGMENTIN) 875-125 MG tablet Take 1 tablet by mouth 2 (two) times daily. Patient not taking: Reported on 05/31/2015 05/14/15   Robyn Haber, MD  fluconazole (DIFLUCAN) 100 MG tablet Take 1 tablet (100 mg total) by mouth once. Patient not taking: Reported on 06/27/2015 05/31/15   Posey Boyer, MD  predniSONE (DELTASONE) 20 MG tablet Two daily with food Patient not taking: Reported on 05/31/2015 05/14/15   Robyn Haber, MD  promethazine (PHENERGAN) 12.5 MG tablet Take 1 tablet (12.5 mg total) by mouth every 8 (eight) hours as needed for nausea or vomiting. Patient not taking: Reported on 06/27/2015 03/21/15   Robyn Haber, MD  topiramate (TOPAMAX) 50 MG tablet Take 1 tablet (50 mg total) by mouth at bedtime. Patient not taking: Reported on 06/27/2015 03/21/15   Robyn Haber, MD    Allergies: No Known Allergies  Social History   Social History  . Marital Status: Significant Other  Spouse Name: N/A  . Number of Children: N/A  . Years of Education: N/A   Occupational History  . Not on file.   Social History Main Topics  . Smoking status: Former Smoker -- 0.30 packs/day for 10 years    Types: Cigarettes  . Smokeless tobacco: Not on file  . Alcohol Use: No  . Drug Use: No  . Sexual Activity: Yes    Birth Control/ Protection: None   Other Topics Concern  . Not on file   Social History Narrative     Review of Systems: Constitutional: negative for chills, fever, night sweats, weight changes, or fatigue  HEENT:  negative for vision changes, hearing loss, congestion, rhinorrhea, ST, epistaxis, or sinus pressure Cardiovascular: negative for palpitations Respiratory: negative for hemoptysis Abdominal: negative for abdominal pain, nausea, vomiting, diarrhea, or constipation Dermatological: negative for rash Neurologic: negative for headache, dizziness, or syncope All other systems reviewed and are otherwise negative with the exception to those above and in the HPI.   Physical Exam: Blood pressure 132/88, pulse 103, temperature 97.5 F (36.4 C), temperature source Oral, resp. rate 16, height 5\' 11"  (1.803 m), weight 269 lb (122.018 kg), SpO2 93 %., Body mass index is 37.53 kg/(m^2). General: Well developed, well nourished, in no acute distress. Head: Normocephalic, atraumatic, eyes without discharge, sclera non-icteric, nares are without discharge. Bilateral auditory canals clear, TM's are without perforation, pearly grey and translucent with reflective cone of light bilaterally. Oral cavity moist, posterior pharynx without exudate, erythema, peritonsillar abscess, or post nasal drip.  Neck: Supple. No thyromegaly. Full ROM. No lymphadenopathy. Lungs: Clear bilaterally to auscultation, no rales, or rhonchi. Diffuse wheezing on inspiration and expiration Heart: No murmurs, rubs, or gallops appreciated. Regular at 100 BPM  Abdomen: Soft, non-tender, non-distended with normoactive bowel sounds. No hepatomegaly. No rebound/guarding. No obvious abdominal masses. Msk:  Strength and tone normal for age. Extremities/Skin: Warm and dry. No clubbing or cyanosis. No edema. No rashes or suspicious lesions. Neuro: Alert and oriented X 3. Moves all extremities spontaneously. Gait is normal. CNII-XII grossly in tact. Psych:  Responds to questions appropriately with a normal affect.  EKG shows atrial flutter    ASSESSMENT AND PLAN:  50 y.o. year old male with shortness of breath and recent 1 hour episode of epigastric  and lower chest pain. He has atrial flutter and is at risk for coronary disease with hypertension and smoking. Therefore we are transporting him by EMS to [facility]   Signed, Robyn Haber, MD 06/27/2015 3:05 PM

## 2015-06-27 NOTE — ED Provider Notes (Signed)
CSN: [id number]     Arrival date & time 06/27/15  1605 History   First MD Initiated Contact with Patient 06/27/15 1650     Chief Complaint  Patient presents with  . Shortness of Breath   (Consider location/radiation/quality/duration/timing/severity/associated sxs/prior Treatment) HPI 50 y.o. male hx Bronchitis, presents to the Emergency Department today from Urgent Care due to abnormal EKG. States that the UC states it was atrial flutter. Originally saw them due to SOB. Noted breathing problems for the past week. No pain. SOB worse with exertion. Notes Sinus congestion. Notes Rhinorrhea. No N/V/D. No Sick contacts. No fevers. No hx MI . Notes previous smoker, but quit several months ago. States 1-2 cigarettes per week. As ABX Rx for Acute Sinusitis from UC. No other symptoms noted.      Past Medical History  Diagnosis Date  . Asthma   . Insomnia   . Migraines   . Bronchitis    History reviewed. No pertinent past surgical history. Family History  Problem Relation Age of Onset  . Asthma Mother    Social History  Substance Use Topics  . Smoking status: Former Smoker -- 0.30 packs/day for 10 years    Types: Cigarettes  . Smokeless tobacco: None  . Alcohol Use: No    Review of Systems ROS reviewed and all are negative for acute change except as noted in the HPI.   Allergies  Review of patient's allergies indicates no known allergies.  Home Medications   Prior to Admission medications   Medication Sig Start Date End Date Taking? Authorizing Provider  albuterol (PROVENTIL HFA;VENTOLIN HFA) 108 (90 Base) MCG/ACT inhaler Inhale 2 puffs into the lungs every 6 (six) hours as needed for wheezing or shortness of breath.   Yes Historical Provider, MD  alprazolam Duanne Moron) 2 MG tablet Take 1 tablet (2 mg total) by mouth at bedtime as needed for sleep. 06/27/15  Yes Robyn Haber, MD  budesonide-formoterol [facility]) 160-4.5 MCG/ACT inhaler Inhale 2 puffs into the lungs 2 (two) times  daily. 05/14/15  Yes Robyn Haber, MD  fluticasone [facility]) 50 MCG/ACT nasal spray Place 2 sprays into both nostrils daily. Patient taking differently: Place 2 sprays into both nostrils daily as needed for allergies.  01/12/15  Yes Robyn Haber, MD  promethazine (PHENERGAN) 12.5 MG tablet Take 1 tablet (12.5 mg total) by mouth every 8 (eight) hours as needed for nausea or vomiting. 03/21/15  Yes Robyn Haber, MD  Pseudoephedrine-Ibuprofen (ADVIL COLD & SINUS LIQUI-GELS) 30-200 MG CAPS Take 1 capsule by mouth daily as needed (FOR COLD).   Yes Historical Provider, MD  SUMAtriptan (IMITREX) 100 MG tablet Take 1/2 or 1 tablet as needed for migraine.  Max 200 mg in 24 hours 03/21/15  Yes Robyn Haber, MD  traZODone (DESYREL) 100 MG tablet Take 1 tablet (100 mg total) by mouth at bedtime. 05/14/15  Yes Robyn Haber, MD  acyclovir ointment (ZOVIRAX) 5 % Use every 4-6 hours as needed for cold sores 05/31/15   Posey Boyer, MD  fluconazole (DIFLUCAN) 100 MG tablet Take 1 tablet (100 mg total) by mouth once. Patient not taking: Reported on 06/27/2015 05/31/15   Posey Boyer, MD  topiramate (TOPAMAX) 50 MG tablet Take 1 tablet (50 mg total) by mouth at bedtime. Patient not taking: Reported on 06/27/2015 03/21/15   Robyn Haber, MD   Pulse 98  Temp(Src) 98.6 F (37 C) (Oral)  Resp 17  SpO2 98%   Physical Exam  Constitutional: He is oriented to person,  place, and time. He appears well-developed and well-nourished.  HENT:  Head: Normocephalic and atraumatic.  Eyes: EOM are normal.  Neck: Normal range of motion. Neck supple. No tracheal deviation present.  Cardiovascular: Normal rate, regular rhythm and normal heart sounds.   No murmur heard. Pulmonary/Chest: Effort normal and breath sounds normal. No respiratory distress. He has no wheezes. He has no rales. He exhibits no tenderness.  Abdominal: Soft. There is no tenderness.  Musculoskeletal: Normal range of motion.  Neurological: He  is alert and oriented to person, place, and time.  Skin: Skin is warm and dry.  Psychiatric: He has a normal mood and affect. His behavior is normal. Thought content normal.  Nursing note and vitals reviewed.   ED Course  Procedures (including critical care time) Labs Review Labs Reviewed  COMPREHENSIVE METABOLIC PANEL - Abnormal; Notable for the following:    Glucose, Bld 109 (*)    Total Bilirubin 1.4 (*)    All other components within normal limits  LIPASE, BLOOD  CBC  I-STAT TROPOININ, ED  Randolm Idol, ED   Imaging Review Dg Chest 2 View  06/27/2015  CLINICAL DATA:  50 year old male with shortness of breath for 1 week. EXAM: CHEST  2 VIEW COMPARISON:  05/30/2009 FINDINGS: The cardiomediastinal silhouette is unremarkable. Mild peribronchial thickening again. There is no evidence of focal airspace disease, pulmonary edema, suspicious pulmonary nodule/mass, pleural effusion, or pneumothorax. No acute bony abnormalities are identified. IMPRESSION: No evidence of acute cardiopulmonary disease. Mild chronic peribronchial thickening. Electronically Signed   By: Margarette Canada M.D.   On: 06/27/2015 17:53   I have personally reviewed and evaluated these images and lab results as part of my medical decision-making.   EKG Interpretation   Date/Time:  Friday June 27 2015 16:05:49 EDT Ventricular Rate:  93 PR Interval:  145 QRS Duration: 99 QT Interval:  371 QTC Calculation: 461 R Axis:   73 Text Interpretation:  Sinus rhythm Borderline low voltage, extremity leads  Since last tracing T wave abnormality NOW PRESENT Abnormal ekg Confirmed  by MILLER  MD, BRIAN ([id number]) on 06/27/2015 5:43:55 PM      MDM  I have reviewed and evaluated the relevant laboratory values.I have reviewed and evaluated the relevant imaging studies.I personally evaluated and interpreted the relevant EKG.I have reviewed the relevant previous healthcare records.I have reviewed EMS Documentation.I obtained HPI  from historian. Patient discussed with supervising physician  ED Course:  Assessment: Pt is a 12yM with hx Bronchitis who presents from UC due to suspected A Flutter and shortness of breath. On exam, pt in NAD. Nontoxic/nonseptic appearing. VSS. Afebrile. Lungs CTA. Heart RRR. Abdomen nontender soft. EKG shows NSR. No hx A Flutter. Trop Neg. CXR shows peribronchial thickening. Will have pt continue with ABX. Given Duo neb in ED with improvement of symptoms. Plan is to [place] with follow up to Cardiology for stress test. At time of discharge, Patient is in no acute distress. Vital Signs are stable. Patient is able to ambulate. Patient able to tolerate PO.   Disposition/Plan:  DC Home Additional Verbal discharge instructions given and discussed with patient.  Pt Instructed to f/u with Cardiology for stress testing. Return precautions given Pt acknowledges and agrees with plan  Supervising Physician Noemi Chapel, MD   Final diagnoses:  Shortness of breath      Shary Decamp, PA-C 06/27/15 2055  Noemi Chapel, MD 06/28/15 [phone]

## 2015-06-27 NOTE — Discharge Instructions (Signed)
Please read and follow all provided instructions.  Your diagnoses today include:  1. Shortness of breath    Tests performed today include:  Vital signs. See below for your results today.   Medications prescribed:   None  Home care instructions:  Follow any educational materials contained in this packet.  Follow-up instructions: Please follow-up with your Cardiology as soon as you can to arrange for a stress test. Call and make an appointment   Return instructions:   Please return to the Emergency Department if you do not get better, if you get worse, or new symptoms OR  - Fever (temperature greater than 101.7F)  - Bleeding that does not stop with holding pressure to the area    -Severe pain (please note that you may be more sore the day after your accident)  - Chest Pain  - Difficulty breathing  - Severe nausea or vomiting  - Inability to tolerate food and liquids  - Passing out  - Skin becoming red around your wounds  - Change in mental status (confusion or lethargy)  - New numbness or weakness     Please return if you have any other emergent concerns.  Additional Information:  Your vital signs today were: BP 144/87 mmHg   Pulse 98   Temp(Src) 98.6 F (37 C) (Oral)   Resp 14   SpO2 98% If your blood pressure (BP) was elevated above 135/85 this visit, please have this repeated by your doctor within one month. ---------------

## 2015-06-27 NOTE — ED Notes (Signed)
[facility]- pt coming from [facility] with c/o shortness of breath and EKG changes. UC reported atrial flutter but EMS EKG was unremarkable. Pt has hx of chronic bronchitis. Wheezing noted throughout lung fields. 5mg  of albuterol administered PTA.

## 2015-06-27 NOTE — ED Provider Notes (Signed)
The patient is a 50 year old male, he has a known history of tobacco abuse which stopped approximately one month ago however he has been having increased coughing, increase shortness of breath especially on exertion when he gets short of breath and sweaty. He denies chest pain, denies swelling of his legs, states that he does not usually have shortness of breath on exertion. He was sent from the urgent care because of a concern for an arrhythmia on the EKG. On exam the patient has clear heart and lung sounds other than diffuse expiratory wheezing. He speaks in full sentences and has no increased work of breathing or respiratory distress. There is no peripheral edema, his abdomen is soft and nontender, he will need further evaluation with EKG and troponin as his exertional shortness of breath could be a coronary equivalent. He will also need albuterol and ambulate on a pulse ox. At the least the patient will need outpatient cardiology follow-up for stress testing given his risk factors.   EKG Interpretation  Date/Time:  Friday June 27 2015 16:05:49 EDT Ventricular Rate:  93 PR Interval:  145 QRS Duration: 99 QT Interval:  371 QTC Calculation: 461 R Axis:   73 Text Interpretation:  Sinus rhythm Borderline low voltage, extremity leads Since last tracing T wave abnormality NOW PRESENT Abnormal ekg Confirmed by Sabra Heck  MD, Nivedita Mirabella ([id number]) on 06/27/2015 5:43:55 PM       Medical screening examination/treatment/procedure(s) were conducted as a shared visit with non-physician practitioner(s) and myself.  I personally evaluated the patient during the encounter.  Clinical Impression:   Final diagnoses:  Shortness of breath         Noemi Chapel, MD 06/28/15 1240

## 2015-06-29 ENCOUNTER — Other Ambulatory Visit: Payer: Self-pay | Admitting: Physician Assistant

## 2015-06-29 DIAGNOSIS — J209 Acute bronchitis, unspecified: Secondary | ICD-10-CM

## 2015-06-29 MED ORDER — AZITHROMYCIN 250 MG PO TABS: ORAL_TABLET | ORAL | Status: DC

## 2015-06-30 ENCOUNTER — Ambulatory Visit (INDEPENDENT_AMBULATORY_CARE_PROVIDER_SITE_OTHER): Payer: BLUE CROSS/BLUE SHIELD | Admitting: Cardiology

## 2015-06-30 ENCOUNTER — Encounter: Payer: Self-pay | Admitting: Cardiology

## 2015-06-30 VITALS — BP 138/92 | HR 62 | Ht 72.0 in | Wt 272.4 lb

## 2015-06-30 DIAGNOSIS — E669 Obesity, unspecified: Secondary | ICD-10-CM

## 2015-06-30 DIAGNOSIS — Z720 Tobacco use: Secondary | ICD-10-CM | POA: Diagnosis not present

## 2015-06-30 DIAGNOSIS — R0789 Other chest pain: Secondary | ICD-10-CM

## 2015-06-30 DIAGNOSIS — R0600 Dyspnea, unspecified: Secondary | ICD-10-CM

## 2015-06-30 NOTE — Patient Instructions (Signed)
Medication Instructions:  The current medical regimen is effective;  continue present plan and medications.  Testing/Procedures: Your physician has requested that you have an echocardiogram. Echocardiography is a painless test that uses sound waves to create images of your heart. It provides your doctor with information about the size and shape of your heart and how well your heart's chambers and valves are working. This procedure takes approximately one hour. There are no restrictions for this procedure.  Your physician has requested that you have a myoview. For further information please visit [email or web address]. Please follow instruction sheet, as given.  Follow-Up: Follow up as needed after your testing.  If you need a refill on your cardiac medications before your next appointment, please call your pharmacy.  Thank you for choosing [facility]!!       

## 2015-06-30 NOTE — Progress Notes (Signed)
Cardiology Office Note    Date:  06/30/2015   ID:  Derek Pacheco, DOB November 09, 1965, MRN [id number]  PCP:  Derek Haber, MD  Cardiologist:   Derek Furbish, MD     History of Present Illness:  Derek Pacheco is a 50 y.o. male  Here for evaluation of  Atrial flutter ,shortness of breath. Has had some mild chest pain as well and sometimes is waking up short of breath at night. Has excessive fatigue and sweating. His mother died of HIV. Unknown father's medical family history. Many years of smoking. Recently reported quitting. He has had some squeezing abdominal pain as well. Coughing at night.   an EKG during the evaluation on 06/27/15  Was interpreted as atrial flutter. Upon inspection, there was baseline artifact that look like atrial flutter however he was in sinus rhythm. He was sent to the emergency room. Breathing problems have been present for about 7 days. Worse with exertion. Had had some sinus congestion. Antibiotics for sinusitis. An EKG was repeated in the emergency room that showed sinus rhythm T-wave changes noted.  Has had bronchitis in the past. More SOB with stairs, walking. Mild CP left sided, intermittent. Moderate SOB.    In the past, his Symbicort seem to have maintained him well. Currently however he is more short of breath, more wheezing heard on exam.    Past Medical History  Diagnosis Date  . Asthma   . Insomnia   . Migraines   . Bronchitis     No past surgical history on file.  Current Medications: Outpatient Prescriptions Prior to Visit  Medication Sig Dispense Refill  . acyclovir ointment (ZOVIRAX) 5 % Use every 4-6 hours as needed for cold sores 15 g 1  . albuterol (PROVENTIL HFA;VENTOLIN HFA) 108 (90 Base) MCG/ACT inhaler Inhale 2 puffs into the lungs every 6 (six) hours as needed for wheezing or shortness of breath.    . alprazolam (XANAX) 2 MG tablet Take 1 tablet (2 mg total) by mouth at bedtime as needed for sleep. 30 tablet 5  . azithromycin  (ZITHROMAX) 250 MG tablet Take 2 tabs PO x 1 dose, then 1 tab PO QD x 4 days 6 tablet 0  . budesonide-formoterol (SYMBICORT) 160-4.5 MCG/ACT inhaler Inhale 2 puffs into the lungs 2 (two) times daily. 1 Inhaler 12  . promethazine (PHENERGAN) 12.5 MG tablet Take 1 tablet (12.5 mg total) by mouth every 8 (eight) hours as needed for nausea or vomiting. 30 tablet 3  . Pseudoephedrine-Ibuprofen (ADVIL COLD & SINUS LIQUI-GELS) 30-200 MG CAPS Take 1 capsule by mouth daily as needed (FOR COLD).    . SUMAtriptan (IMITREX) 100 MG tablet Take 1/2 or 1 tablet as needed for migraine.  Max 200 mg in 24 hours 30 tablet 2  . topiramate (TOPAMAX) 50 MG tablet Take 1 tablet (50 mg total) by mouth at bedtime. 30 tablet 11  . traZODone (DESYREL) 100 MG tablet Take 1 tablet (100 mg total) by mouth at bedtime. 30 tablet 5  . fluconazole (DIFLUCAN) 100 MG tablet Take 1 tablet (100 mg total) by mouth once. 3 tablet 0  . fluticasone (FLONASE) 50 MCG/ACT nasal spray Place 2 sprays into both nostrils daily. (Patient taking differently: Place 2 sprays into both nostrils daily as needed for allergies. ) 16 g 6   No facility-administered medications prior to visit.     Allergies:   Review of patient's allergies indicates no known allergies.   Social History   Social History  .  Marital Status: Significant Other    Spouse Name: N/A  . Number of Children: N/A  . Years of Education: N/A   Social History Main Topics  . Smoking status: Former Smoker -- 0.30 packs/day for 10 years    Types: Cigarettes  . Smokeless tobacco: None  . Alcohol Use: No  . Drug Use: No  . Sexual Activity: Yes    Birth Control/ Protection: None   Other Topics Concern  . None   Social History Narrative     Family History:  The patient's family history includes Asthma in his mother.   ROS:   Please see the history of present illness.    ROS All other systems reviewed and are negative.   PHYSICAL EXAM:   VS:  BP 138/92 mmHg  Pulse 62   Ht 6' (1.829 m)  Wt 272 lb 6.4 oz (123.56 kg)  BMI 36.94 kg/m2   GEN: Well nourished, well developed, in no acute distress HEENT: normal Neck: no JVD, carotid bruits, or masses Cardiac: RRR; no murmurs, rubs, or gallops,no edema  Respiratory:   Wheezing heard bilaterally , no increased work of breathing. GI: soft, nontender, nondistended, + BS , overweight MS: no deformity or atrophy Skin: warm and dry, no rash , no edema Neuro:  Alert and Oriented x 3, Strength and sensation are intact Psych: euthymic mood, full affect  Wt Readings from Last 3 Encounters:  06/30/15 272 lb 6.4 oz (123.56 kg)  06/27/15 269 lb (122.018 kg)  05/31/15 279 lb 6 oz (126.724 kg)      Studies/Labs Reviewed:   EKG:  EKG is not ordered today.   Prior EKGs personally reviewed , sinus rhythm, nonspecific T-wave changes.  Recent Labs: 06/27/2015: ALT 37; BUN 17; Creatinine, Ser 0.98; Hemoglobin 15.1; Platelets 210; Potassium 3.6; Sodium 140   Lipid Panel No results found for: CHOL, TRIG, HDL, CHOLHDL, VLDL, LDLCALC, LDLDIRECT  Additional studies/ records that were reviewed today include:   prior office notes, ER visit, lab work, EKGs reviewed, chest x-ray also personally reviewed -no cardiomegaly , no significant edema    ASSESSMENT:    1. Dyspnea   2. Atypical chest pain   3. Obesity   4. Tobacco use      PLAN:  In order of problems listed above:  Dyspnea   - we will go ahead and check an echocardiogram to ensure proper structure and function of his heart. Thankfully, there was no evidence of cardiomegaly on chest x-ray. I want to make sure that he does not have a cardiomyopathy.  - diffuse wheezes heard on exam today. Sounds like reactive airways. He has been taking Symbicort and has his rescue albuterol inhaler. He is unsure why this is not working currently. If cardiac workup is unremarkable, could consider further pulmonary evaluation if necessary.   Thankfully there is no evidence of  atrial flutter. Artifact noted on EKG.  Chest pain  -We will check a nuclear stress test to ensure but there is no evidence of significant ischemia. He had an extensive former smoking history. Unknown family history of coronary artery disease.  Obesity  continue to encourage weight loss.   Tobacco use  Good job on cessation.  Medication Adjustments/Labs and Tests Ordered: Current medicines are reviewed at length with the patient today.  Concerns regarding medicines are outlined above.  Medication changes, Labs and Tests ordered today are listed in the Patient Instructions below. There are no Patient Instructions on file for this visit.  Bobby Rumpf, MD  06/30/2015 10:37 AM    [facility] [address] [place], [place], [place]  [address] Phone: [phone]; Fax: [phone]

## 2015-07-01 ENCOUNTER — Encounter (HOSPITAL_COMMUNITY): Payer: Self-pay | Admitting: Emergency Medicine

## 2015-07-01 ENCOUNTER — Emergency Department (HOSPITAL_COMMUNITY): Payer: BLUE CROSS/BLUE SHIELD

## 2015-07-01 ENCOUNTER — Inpatient Hospital Stay (HOSPITAL_COMMUNITY)
Admission: EM | Admit: 2015-07-01 | Discharge: 2015-07-03 | DRG: 202 | Disposition: A | Discharge status: Home / Self Care | Payer: BLUE CROSS/BLUE SHIELD | Attending: Internal Medicine | Admitting: Internal Medicine

## 2015-07-01 DIAGNOSIS — Z87891 Personal history of nicotine dependence: Secondary | ICD-10-CM

## 2015-07-01 DIAGNOSIS — R0902 Hypoxemia: Secondary | ICD-10-CM | POA: Diagnosis present

## 2015-07-01 DIAGNOSIS — J441 Chronic obstructive pulmonary disease with (acute) exacerbation: Secondary | ICD-10-CM | POA: Diagnosis not present

## 2015-07-01 DIAGNOSIS — Z7951 Long term (current) use of inhaled steroids: Secondary | ICD-10-CM

## 2015-07-01 DIAGNOSIS — J45902 Unspecified asthma with status asthmaticus: Principal | ICD-10-CM | POA: Diagnosis present

## 2015-07-01 DIAGNOSIS — J449 Chronic obstructive pulmonary disease, unspecified: Secondary | ICD-10-CM | POA: Diagnosis present

## 2015-07-01 DIAGNOSIS — R0600 Dyspnea, unspecified: Secondary | ICD-10-CM | POA: Diagnosis not present

## 2015-07-01 DIAGNOSIS — E876 Hypokalemia: Secondary | ICD-10-CM | POA: Diagnosis present

## 2015-07-01 DIAGNOSIS — Z7722 Contact with and (suspected) exposure to environmental tobacco smoke (acute) (chronic): Secondary | ICD-10-CM | POA: Diagnosis not present

## 2015-07-01 DIAGNOSIS — I471 Supraventricular tachycardia: Secondary | ICD-10-CM | POA: Diagnosis present

## 2015-07-01 DIAGNOSIS — R0602 Shortness of breath: Secondary | ICD-10-CM | POA: Diagnosis not present

## 2015-07-01 LAB — TROPONIN I

## 2015-07-01 LAB — BASIC METABOLIC PANEL
Anion gap: 9 (ref 5–15)
BUN: 13 mg/dL (ref 6–20)
CALCIUM: 9 mg/dL (ref 8.9–10.3)
CO2: 29 mmol/L (ref 22–32)
CREATININE: 0.99 mg/dL (ref 0.61–1.24)
Chloride: 100 mmol/L — ABNORMAL LOW (ref 101–111)
GFR calc non Af Amer: >60 mL/min (ref >60)
Glucose, Bld: 117 mg/dL — ABNORMAL HIGH (ref 65–99)
Potassium: 4.2 mmol/L (ref 3.5–5.1)
SODIUM: 138 mmol/L (ref 135–145)

## 2015-07-01 LAB — CBC
HCT: 44.2 % (ref 39.0–52.0)
Hemoglobin: 15.2 g/dL (ref 13.0–17.0)
MCH: 30.5 pg (ref 26.0–34.0)
MCHC: 34.4 g/dL (ref 30.0–36.0)
MCV: 88.6 fL (ref 78.0–100.0)
PLATELETS: 189 10*3/uL (ref 150–400)
RBC: 4.99 MIL/uL (ref 4.22–5.81)
RDW: 12.7 % (ref 11.5–15.5)
WBC: 8.1 10*3/uL (ref 4.0–10.5)

## 2015-07-01 LAB — BRAIN NATRIURETIC PEPTIDE: B Natriuretic Peptide: 21 pg/mL (ref 0.0–100.0)

## 2015-07-01 MED ORDER — ALBUTEROL (5 MG/ML) CONTINUOUS INHALATION SOLN
10.0000 mg/h | INHALATION_SOLUTION | Freq: Once | RESPIRATORY_TRACT | Status: AC
Start: 2015-07-01 — End: 2015-07-01
  Administered 2015-07-01: 10 mg/h via RESPIRATORY_TRACT
  Filled 2015-07-01: qty 20

## 2015-07-01 MED ORDER — IPRATROPIUM-ALBUTEROL 0.5-2.5 (3) MG/3ML IN SOLN
3.0000 mL | Freq: Once | RESPIRATORY_TRACT | Status: AC
  Administered 2015-07-01: 3 mL via RESPIRATORY_TRACT
  Filled 2015-07-01: qty 3

## 2015-07-01 MED ORDER — METHYLPREDNISOLONE SODIUM SUCC 125 MG IJ SOLR
125.0000 mg | Freq: Once | INTRAMUSCULAR | Status: AC
  Administered 2015-07-01: 125 mg via INTRAVENOUS
  Filled 2015-07-01: qty 2

## 2015-07-01 NOTE — H&P (Signed)
[facility] History and Physical  Derek Pacheco [id number] DOB: 01/16/1966    PCP:   Robyn Haber, MD   Chief Complaint: Chest pain and shortness of breath  HPI: Derek Pacheco is an 50 y.o. male  With a hx of asthma, bronchitis, and a hx of smoking who presents to the ED with complaints of sudden onset, constant, and worsening chest pain and shortness of breath that began approximately 2 weeks ago. He states that his symptoms had been improving, though 4 days ago they began to significantly worsen and became severe today. He reports that 4 days ago he was performing heavy lifting in an area with fine concrete dust, along with having regular second hand smoke exposure.  He was subsequently seen at [facility]  for a-fib evaluation. He was scheduled to have an ECHO and stress test next week.  He reports using his inhaler, with little relief of symptoms; however, after receiving Tx in the ED, he states that his symptoms have begun to significantly improve.    He admits that he has been hospitalized for lung issues, and that he regularly uses inhaler for bronchitis. He has a hx of smoking, though he denies smoking or drinking currently. He has no history of DM  While in the ED, workup showed BNP and troponin wnl. CXR showed no active disease. Hospitalist was asked to admit for management of COPD exacerbation.  Rewiew of Systems:  Constitutional: Negative for malaise, fever and chills. No significant weight loss or weight gain Eyes: Negative for eye pain, redness and discharge, diplopia, visual changes, or flashes of light. ENMT: Negative for ear pain, hoarseness, nasal congestion, sinus pressure and sore throat. No headaches; tinnitus, drooling, or problem swallowing. Cardiovascular: Negative for chest pain, palpitations, diaphoresis, dyspnea and peripheral edema. ; No orthopnea, PND Respiratory: Negative for cough, hemoptysis, wheezing and stridor. No pleuritic  chestpain. Gastrointestinal: Negative for nausea, vomiting, diarrhea, constipation, abdominal pain, melena, blood in stool, hematemesis, jaundice and rectal bleeding.    Genitourinary: Negative for frequency, dysuria, incontinence,flank pain and hematuria; Musculoskeletal: Negative for back pain and neck pain. Negative for swelling and trauma.;  Skin: . Negative for pruritus, rash, abrasions, bruising and skin lesion.; ulcerations Neuro: Negative for headache, lightheadedness and neck stiffness. Negative for weakness, altered level of consciousness , altered mental status, extremity weakness, burning feet, involuntary movement, seizure and syncope.  Psych: negative for anxiety, depression, insomnia, tearfulness, panic attacks, hallucinations, paranoia, suicidal or homicidal ideation    Past Medical History  Diagnosis Date  . Asthma   . Insomnia   . Migraines   . Bronchitis     History reviewed. No pertinent past surgical history.  Medications:  HOME MEDS: Prior to Admission medications   Medication Sig Start Date End Date Taking? Authorizing Provider  acyclovir ointment (ZOVIRAX) 5 % Use every 4-6 hours as needed for cold sores 05/31/15  Yes Posey Boyer, MD  albuterol (PROVENTIL HFA;VENTOLIN HFA) 108 (90 Base) MCG/ACT inhaler Inhale 2 puffs into the lungs every 6 (six) hours as needed for wheezing or shortness of breath.   Yes Historical Provider, MD  alprazolam Duanne Moron) 2 MG tablet Take 1 tablet (2 mg total) by mouth at bedtime as needed for sleep. 06/27/15  Yes Robyn Haber, MD  azithromycin (ZITHROMAX) 250 MG tablet Take 2 tabs PO x 1 dose, then 1 tab PO QD x 4 days Patient taking differently: Take 250-500 mg by mouth See admin instructions. Take 2 tabs  Day 1, then 1  tab x 4 days 06/29/15 07/04/15 Yes Bennett Scrape V, PA-C  budesonide-formoterol (SYMBICORT) 160-4.5 MCG/ACT inhaler Inhale 2 puffs into the lungs 2 (two) times daily. 05/14/15  Yes Robyn Haber, MD  promethazine  (PHENERGAN) 12.5 MG tablet Take 1 tablet (12.5 mg total) by mouth every 8 (eight) hours as needed for nausea or vomiting. 03/21/15  Yes Robyn Haber, MD  Pseudoephedrine-Ibuprofen (ADVIL COLD & SINUS LIQUI-GELS) 30-200 MG CAPS Take 1 capsule by mouth daily as needed (FOR COLD).   Yes Historical Provider, MD  SUMAtriptan (IMITREX) 100 MG tablet Take 1/2 or 1 tablet as needed for migraine.  Max 200 mg in 24 hours Patient taking differently: Take 50-100 mg by mouth every 2 (two) hours as needed for migraine (Take 1/2 or 1 tablet as needed for migraine.  Max 200 mg in 24 hours).  03/21/15  Yes Robyn Haber, MD  topiramate (TOPAMAX) 50 MG tablet Take 1 tablet (50 mg total) by mouth at bedtime. Patient taking differently: Take 50 mg by mouth daily as needed (for cluster migraine).  03/21/15  Yes Robyn Haber, MD  traZODone (DESYREL) 100 MG tablet Take 1 tablet (100 mg total) by mouth at bedtime. 05/14/15  Yes Robyn Haber, MD  fluticasone [facility]) 50 MCG/ACT nasal spray Place 2 sprays into both nostrils daily as needed for allergies or rhinitis.    Historical Provider, MD     Allergies:  No Known Allergies  Social History:   reports that he has quit smoking. His smoking use included Cigarettes. He has a 3 pack-year smoking history. He does not have any smokeless tobacco history on file. He reports that he does not drink alcohol or use illicit drugs.  Family History: Family History  Problem Relation Age of Onset  . Asthma Mother      Physical Exam: Filed Vitals:   07/01/15 2209 07/01/15 2230 07/01/15 2300 07/01/15 2330  BP:  137/89 121/78 140/98  Pulse:  101 101 101  Temp:      TempSrc:      Resp:  21 30 20   Height:      Weight:      SpO2: 95% 97% 95% 97%   Blood pressure 140/98, pulse 101, temperature 98.7 F (37.1 C), temperature source Oral, resp. rate 20, height 6' (1.829 m), weight 122.018 kg (269 lb), SpO2 97 %.  GEN:  Pleasant,  patient lying in the stretcher in no  acute distress; cooperative with exam. PSYCH:  alert and oriented x4; does not appear anxious or depressed; affect is appropriate. HEENT: Mucous membranes pink and anicteric; PERRLA; EOM intact; no cervical lymphadenopathy nor thyromegaly or carotid bruit; no JVD; There were no stridor. Neck is very supple. Breasts:: Not examined CHEST WALL: No tenderness CHEST: Positive for wheeze  HEART: Regular rate and rhythm.  There are no murmur, rub, or gallops.   BACK: No kyphosis or scoliosis; no CVA tenderness ABDOMEN: soft and non-tender; no masses, no organomegaly, normal abdominal bowel sounds; no pannus; no intertriginous candida. There is no rebound and no distention. Rectal Exam: Not done EXTREMITIES: No bone or joint deformity; age-appropriate arthropathy of the hands and knees; no edema; no ulcerations.  There is no calf tenderness. Genitalia: not examined PULSES: 2+ and symmetric SKIN: Normal hydration no rash or ulceration CNS: Cranial nerves 2-12 grossly intact no focal lateralizing neurologic deficit.  Speech is fluent; uvula elevated with phonation, facial symmetry and tongue midline. DTR are normal bilaterally, cerebella exam is intact, barbinski is negative and strengths are  equaled bilaterally.  No sensory loss.   Labs on Admission:  Basic Metabolic Panel:  Recent Labs Lab 06/27/15 1844 07/01/15 2216  NA 140 138  K 3.6 4.2  CL 105 100*  CO2 22 29  GLUCOSE 109* 117*  BUN 17 13  CREATININE 0.98 0.99  CALCIUM 9.4 9.0   Liver Function Tests:  Recent Labs Lab 06/27/15 1844  AST 34  ALT 37  ALKPHOS 86  BILITOT 1.4*  PROT 7.5  ALBUMIN 4.5    Recent Labs Lab 06/27/15 1844  LIPASE 18   CBC:  Recent Labs Lab 06/27/15 1951 07/01/15 2216  WBC 7.2 8.1  HGB 15.1 15.2  HCT 44.4 44.2  MCV 87.9 88.6  PLT 210 189   Cardiac Enzymes:  Recent Labs Lab 07/01/15 2216  TROPONINI <0.03    Radiological Exams on Admission: Dg Chest Port 1 View  07/01/2015   CLINICAL DATA:  Increasing dyspnea and chest congestion over the past 2 weeks. Abrupt worsening tonight. EXAM: PORTABLE CHEST 1 VIEW COMPARISON:  06/27/2015 FINDINGS: A single AP portable view of the chest demonstrates no focal airspace consolidation or alveolar edema. The lungs are grossly clear. There is no large effusion or pneumothorax. Cardiac and mediastinal contours appear unremarkable. IMPRESSION: No active disease. Electronically Signed   By: Andreas Newport M.D.   On: 07/01/2015 22:32    EKG: Independently reviewed.    Assessment/Plan Present on Admission:  . Status asthmaticus with COPD (chronic obstructive pulmonary disease) ([place]) . Status asthmaticus, allergic  A/P:   I suspect he has chronic asthma, and with dust exposure, developed status asthmaticus, since he has persistent wheezing for days.  His presentation doesn't sound like cardiac wheezing, and he has no evidence of CHF.  Will Tx with nebs, along with IV Steroids, and finishing up his oral Zithromax for another 2 days.  Will continue his home meds.  I will order a cardiac ECHO as planned by Dr Marlou Porch. He is otherwise stable, full code, and will be admitted to [facility] service.  Avoid dust exposure and smoke exposure was strongly recommnended.  Thank you and Good Day.   Other plans as per orders. Code Status: Full   Orvan Falconer, MD. Rosalita Chessman.  [facility] Pager [phone] 7pm to 7am.  07/01/2015, 11:56 PM   By signing my name below, I, Delene Ruffini, attest that this documentation has been prepared under the direction and in the presence of Orvan Falconer, MD. Electronically Signed: Delene Ruffini, Scribe 07/02/2015 11:55pm

## 2015-07-01 NOTE — ED Provider Notes (Signed)
CSN: [id number]     Arrival date & time 07/01/15  2111 History  By signing my name below, I, Dora Sims, attest that this documentation has been prepared under the direction and in the presence of physician practitioner, Dorie Rank, MD. Electronically Signed: Dora Sims, Scribe. 07/01/2015. 9:53 PM.    Chief Complaint  Patient presents with  . Chest Pain  . Shortness of Breath    The history is provided by the patient. No language interpreter was used.     HPI Comments: Derek Pacheco is a 50 y.o. male with h/o asthma and bronchitis who presents to the Emergency Department complaining of sudden onset, constant, worsening, CP and SOB beginning 2 weeks ago. He states that his symptoms had been improving but worsened significantly over the past 4 days. Pt notes that he was seen at [facility] 4 days ago for his symptoms and had a stress test done yesterday. He notes associated wheezing as well which is worse when he ambulates or lays down. He notes that his symptoms became severely worse today so he came to the ER. Pt is a former smoker. He does not use oxygen at home. Pt intermittently uses Symbicort and albuterol at home. Pt notes that he had an irregular heart rhythm 4 days ago while at [facility]. He denies fever or any other symptoms.  Past Medical History  Diagnosis Date  . Asthma   . Insomnia   . Migraines   . Bronchitis    History reviewed. No pertinent past surgical history. Family History  Problem Relation Age of Onset  . Asthma Mother    Social History  Substance Use Topics  . Smoking status: Former Smoker -- 0.30 packs/day for 10 years    Types: Cigarettes  . Smokeless tobacco: None  . Alcohol Use: No    Review of Systems  Constitutional: Negative for fever.  Respiratory: Positive for shortness of breath and wheezing.   Cardiovascular: Positive for chest pain.  All other systems reviewed and are negative.   Allergies  Review of patient's allergies  indicates no known allergies.  Home Medications   Prior to Admission medications   Medication Sig Start Date End Date Taking? Authorizing Provider  acyclovir ointment (ZOVIRAX) 5 % Use every 4-6 hours as needed for cold sores 05/31/15  Yes Posey Boyer, MD  albuterol (PROVENTIL HFA;VENTOLIN HFA) 108 (90 Base) MCG/ACT inhaler Inhale 2 puffs into the lungs every 6 (six) hours as needed for wheezing or shortness of breath.   Yes Historical Provider, MD  alprazolam Duanne Moron) 2 MG tablet Take 1 tablet (2 mg total) by mouth at bedtime as needed for sleep. 06/27/15  Yes Robyn Haber, MD  azithromycin (ZITHROMAX) 250 MG tablet Take 2 tabs PO x 1 dose, then 1 tab PO QD x 4 days Patient taking differently: Take 250-500 mg by mouth See admin instructions. Take 2 tabs  Day 1, then 1 tab x 4 days 06/29/15 07/04/15 Yes Bennett Scrape V, PA-C  budesonide-formoterol (SYMBICORT) 160-4.5 MCG/ACT inhaler Inhale 2 puffs into the lungs 2 (two) times daily. 05/14/15  Yes Robyn Haber, MD  promethazine (PHENERGAN) 12.5 MG tablet Take 1 tablet (12.5 mg total) by mouth every 8 (eight) hours as needed for nausea or vomiting. 03/21/15  Yes Robyn Haber, MD  Pseudoephedrine-Ibuprofen (ADVIL COLD & SINUS LIQUI-GELS) 30-200 MG CAPS Take 1 capsule by mouth daily as needed (FOR COLD).   Yes Historical Provider, MD  SUMAtriptan (IMITREX) 100 MG tablet Take  1/2 or 1 tablet as needed for migraine.  Max 200 mg in 24 hours Patient taking differently: Take 50-100 mg by mouth every 2 (two) hours as needed for migraine (Take 1/2 or 1 tablet as needed for migraine.  Max 200 mg in 24 hours).  03/21/15  Yes Robyn Haber, MD  topiramate (TOPAMAX) 50 MG tablet Take 1 tablet (50 mg total) by mouth at bedtime. Patient taking differently: Take 50 mg by mouth daily as needed (for cluster migraine).  03/21/15  Yes Robyn Haber, MD  traZODone (DESYREL) 100 MG tablet Take 1 tablet (100 mg total) by mouth at bedtime. 05/14/15  Yes Robyn Haber,  MD  fluticasone [facility]) 50 MCG/ACT nasal spray Place 2 sprays into both nostrils daily as needed for allergies or rhinitis.    Historical Provider, MD   BP 140/98 mmHg  Pulse 101  Temp(Src) 98.7 F (37.1 C) (Oral)  Resp 20  Ht 6' (1.829 m)  Wt 122.018 kg  BMI 36.48 kg/m2  SpO2 97% Physical Exam  Constitutional: He appears well-developed and well-nourished.  HENT:  Head: Normocephalic and atraumatic.  Right Ear: External ear normal.  Left Ear: External ear normal.  Mouth/Throat: No oropharyngeal exudate.  Eyes: Conjunctivae are normal. Right eye exhibits no discharge. Left eye exhibits no discharge. No scleral icterus.  Neck: Neck supple. No tracheal deviation present.  Cardiovascular: Normal rate, regular rhythm and intact distal pulses.   Pulmonary/Chest: Accessory muscle usage present. No stridor. Tachypnea noted. No respiratory distress. He has wheezes. He has no rales.  Abdominal: Soft. Bowel sounds are normal. He exhibits no distension. There is no tenderness. There is no rebound and no guarding.  Musculoskeletal: He exhibits no edema or tenderness.  Neurological: He is alert. He has normal strength. No cranial nerve deficit (no facial droop, extraocular movements intact, no slurred speech) or sensory deficit. He exhibits normal muscle tone. He displays no seizure activity. Coordination normal.  Skin: Skin is warm and dry. No rash noted.  Psychiatric: He has a normal mood and affect.  Nursing note and vitals reviewed.   ED Course  Procedures (including critical care time)  DIAGNOSTIC STUDIES: Oxygen Saturation is 94% on [place], adequate by my interpretation.    COORDINATION OF CARE: 9:53 PM Will administer Solu-Medrol 125 mg injection and albuterol. Will order DG Chest [facility]. Will order blood work. Will order cardiac monitoring and oxygen therapy. Discussed treatment plan with pt at bedside and pt agreed to plan.  Labs Review Labs Reviewed  BASIC METABOLIC PANEL -  Abnormal; Notable for the following:    Chloride 100 (*)    Glucose, Bld 117 (*)    All other components within normal limits  CBC  TROPONIN I  BRAIN NATRIURETIC PEPTIDE    Imaging Review Dg Chest Port 1 View  07/01/2015  CLINICAL DATA:  Increasing dyspnea and chest congestion over the past 2 weeks. Abrupt worsening tonight. EXAM: PORTABLE CHEST 1 VIEW COMPARISON:  06/27/2015 FINDINGS: A single AP portable view of the chest demonstrates no focal airspace consolidation or alveolar edema. The lungs are grossly clear. There is no large effusion or pneumothorax. Cardiac and mediastinal contours appear unremarkable. IMPRESSION: No active disease. Electronically Signed   By: Andreas Newport M.D.   On: 07/01/2015 22:32   I have personally reviewed and evaluated these images and lab results as part of my medical decision-making.   EKG Interpretation   Date/Time:  Tuesday July 01 2015 21:16:07 EDT Ventricular Rate:  106 PR  Interval:  178 QRS Duration: 92 QT Interval:  346 QTC Calculation: 459 R Axis:   101 Text Interpretation:  Sinus tachycardia Rightward axis Borderline ECG  Artifact wandering baseline, needs repeat Confirmed by Wyvonnia Dusky  MD,  STEPHEN [phone]) on 07/01/2015 9:52:08 PM     Medications  ipratropium-albuterol (DUONEB) 0.5-2.5 (3) MG/3ML nebulizer solution 3 mL (3 mLs Nebulization Given 07/01/15 2153)  methylPREDNISolone sodium succinate (SOLU-MEDROL) 125 mg/2 mL injection 125 mg (125 mg Intravenous Given 07/01/15 2221)  albuterol (PROVENTIL,VENTOLIN) solution continuous neb (10 mg/hr Nebulization Given 07/01/15 2209)    MDM   Final diagnoses:  COPD exacerbation ([place])   CXR without PNA.  Labs normal without signs of cardiac injury or CHF Pt given albuterol atrovent treatments and steroids for a copd exacerbation 2350  On repeat exam, pt is still wheezing and tachypnic.  Already tried outpatient treatment.  Will consult with medical service for admission.  I personally  performed the services described in this documentation, which was scribed in my presence.  The recorded information has been reviewed and is accurate.   Dorie Rank, MD 07/01/15 2351

## 2015-07-01 NOTE — ED Notes (Signed)
Pt was seen at [facility] Friday for chest pain, scheduled for stress test Monday.  Tonight having increased SOB and chest pain

## 2015-07-02 ENCOUNTER — Encounter (HOSPITAL_COMMUNITY): Payer: Self-pay | Admitting: Internal Medicine

## 2015-07-02 ENCOUNTER — Telehealth (HOSPITAL_COMMUNITY): Payer: Self-pay | Admitting: *Deleted

## 2015-07-02 ENCOUNTER — Inpatient Hospital Stay (HOSPITAL_COMMUNITY): Payer: BLUE CROSS/BLUE SHIELD

## 2015-07-02 DIAGNOSIS — R0600 Dyspnea, unspecified: Secondary | ICD-10-CM | POA: Diagnosis not present

## 2015-07-02 DIAGNOSIS — Z7722 Contact with and (suspected) exposure to environmental tobacco smoke (acute) (chronic): Secondary | ICD-10-CM

## 2015-07-02 DIAGNOSIS — E876 Hypokalemia: Secondary | ICD-10-CM | POA: Diagnosis present

## 2015-07-02 DIAGNOSIS — J449 Chronic obstructive pulmonary disease, unspecified: Secondary | ICD-10-CM | POA: Diagnosis not present

## 2015-07-02 DIAGNOSIS — R0602 Shortness of breath: Secondary | ICD-10-CM | POA: Diagnosis present

## 2015-07-02 DIAGNOSIS — Z87891 Personal history of nicotine dependence: Secondary | ICD-10-CM | POA: Diagnosis not present

## 2015-07-02 DIAGNOSIS — Z7951 Long term (current) use of inhaled steroids: Secondary | ICD-10-CM | POA: Diagnosis not present

## 2015-07-02 DIAGNOSIS — I471 Supraventricular tachycardia: Secondary | ICD-10-CM

## 2015-07-02 DIAGNOSIS — J45902 Unspecified asthma with status asthmaticus: Principal | ICD-10-CM

## 2015-07-02 DIAGNOSIS — R0902 Hypoxemia: Secondary | ICD-10-CM | POA: Diagnosis present

## 2015-07-02 DIAGNOSIS — J441 Chronic obstructive pulmonary disease with (acute) exacerbation: Secondary | ICD-10-CM | POA: Diagnosis not present

## 2015-07-02 LAB — ECHOCARDIOGRAM COMPLETE
Height: 72 in
Weight: 4272 oz

## 2015-07-02 LAB — BASIC METABOLIC PANEL
Anion gap: 12 (ref 5–15)
BUN: 12 mg/dL (ref 6–20)
CHLORIDE: 102 mmol/L (ref 101–111)
CO2: 24 mmol/L (ref 22–32)
CREATININE: 0.95 mg/dL (ref 0.61–1.24)
Calcium: 9.1 mg/dL (ref 8.9–10.3)
GFR calc Af Amer: >60 mL/min (ref >60)
GFR calc non Af Amer: >60 mL/min (ref >60)
Glucose, Bld: 237 mg/dL — ABNORMAL HIGH (ref 65–99)
Potassium: 3.2 mmol/L — ABNORMAL LOW (ref 3.5–5.1)
SODIUM: 138 mmol/L (ref 135–145)

## 2015-07-02 LAB — CBC
HCT: 42.8 % (ref 39.0–52.0)
Hemoglobin: 14.4 g/dL (ref 13.0–17.0)
MCH: 29.8 pg (ref 26.0–34.0)
MCHC: 33.6 g/dL (ref 30.0–36.0)
MCV: 88.4 fL (ref 78.0–100.0)
PLATELETS: 204 10*3/uL (ref 150–400)
RBC: 4.84 MIL/uL (ref 4.22–5.81)
RDW: 12.7 % (ref 11.5–15.5)
WBC: 6.6 10*3/uL (ref 4.0–10.5)

## 2015-07-02 MED ORDER — ALPRAZOLAM 1 MG PO TABS
2.0000 mg | ORAL_TABLET | Freq: Every evening | ORAL | Status: DC | PRN
Start: 2015-07-02 — End: 2015-07-03
  Administered 2015-07-02: 2 mg via ORAL
  Filled 2015-07-02: qty 2

## 2015-07-02 MED ORDER — ALUM & MAG HYDROXIDE-SIMETH 200-200-20 MG/5ML PO SUSP
30.0000 mL | Freq: Four times a day (QID) | ORAL | Status: DC | PRN
  Administered 2015-07-02: 30 mL via ORAL
  Filled 2015-07-02: qty 30

## 2015-07-02 MED ORDER — ALBUTEROL SULFATE (2.5 MG/3ML) 0.083% IN NEBU
2.5000 mg | INHALATION_SOLUTION | Freq: Four times a day (QID) | RESPIRATORY_TRACT | Status: DC
  Administered 2015-07-02 (×2): 2.5 mg via RESPIRATORY_TRACT
  Filled 2015-07-02 (×2): qty 3

## 2015-07-02 MED ORDER — ENOXAPARIN SODIUM 40 MG/0.4ML ~~LOC~~ SOLN
40.0000 mg | SUBCUTANEOUS | Status: DC
  Administered 2015-07-02 – 2015-07-03 (×2): 40 mg via SUBCUTANEOUS
  Filled 2015-07-02 (×3): qty 0.4

## 2015-07-02 MED ORDER — TRAZODONE HCL 50 MG PO TABS
100.0000 mg | ORAL_TABLET | Freq: Every day | ORAL | Status: DC
  Administered 2015-07-02 (×2): 100 mg via ORAL
  Filled 2015-07-02 (×2): qty 2

## 2015-07-02 MED ORDER — ONDANSETRON HCL 4 MG PO TABS: 4.0000 mg | ORAL_TABLET | Freq: Four times a day (QID) | ORAL | Status: DC | PRN

## 2015-07-02 MED ORDER — SODIUM CHLORIDE 0.9% FLUSH
3.0000 mL | Freq: Two times a day (BID) | INTRAVENOUS | Status: DC
  Administered 2015-07-02 – 2015-07-03 (×4): 3 mL via INTRAVENOUS

## 2015-07-02 MED ORDER — METHYLPREDNISOLONE SODIUM SUCC 40 MG IJ SOLR
40.0000 mg | Freq: Four times a day (QID) | INTRAMUSCULAR | Status: DC
  Administered 2015-07-02 – 2015-07-03 (×6): 40 mg via INTRAVENOUS
  Filled 2015-07-02 (×7): qty 1

## 2015-07-02 MED ORDER — PERFLUTREN LIPID MICROSPHERE
1.0000 mL | INTRAVENOUS | Status: AC | PRN
  Administered 2015-07-02: 4 mL via INTRAVENOUS
  Filled 2015-07-02: qty 10

## 2015-07-02 MED ORDER — IPRATROPIUM-ALBUTEROL 0.5-2.5 (3) MG/3ML IN SOLN
3.0000 mL | RESPIRATORY_TRACT | Status: DC
  Administered 2015-07-02 – 2015-07-03 (×6): 3 mL via RESPIRATORY_TRACT
  Filled 2015-07-02 (×7): qty 3

## 2015-07-02 MED ORDER — ALBUTEROL SULFATE (2.5 MG/3ML) 0.083% IN NEBU: 2.5000 mg | INHALATION_SOLUTION | RESPIRATORY_TRACT | Status: DC | PRN

## 2015-07-02 MED ORDER — POTASSIUM CHLORIDE CRYS ER 20 MEQ PO TBCR
40.0000 meq | EXTENDED_RELEASE_TABLET | ORAL | Status: AC
  Administered 2015-07-02 (×2): 40 meq via ORAL
  Filled 2015-07-02 (×2): qty 2

## 2015-07-02 MED ORDER — TOPIRAMATE 25 MG PO TABS
50.0000 mg | ORAL_TABLET | Freq: Every day | ORAL | Status: DC
  Administered 2015-07-02: 50 mg via ORAL
  Filled 2015-07-02 (×3): qty 2

## 2015-07-02 MED ORDER — ALBUTEROL SULFATE (2.5 MG/3ML) 0.083% IN NEBU
2.5000 mg | INHALATION_SOLUTION | RESPIRATORY_TRACT | Status: DC
  Administered 2015-07-02: 2.5 mg via RESPIRATORY_TRACT
  Filled 2015-07-02: qty 3

## 2015-07-02 MED ORDER — ASPIRIN EC 81 MG PO TBEC
81.0000 mg | DELAYED_RELEASE_TABLET | Freq: Every day | ORAL | Status: DC
  Administered 2015-07-02 – 2015-07-03 (×2): 81 mg via ORAL
  Filled 2015-07-02 (×2): qty 1

## 2015-07-02 MED ORDER — IBUPROFEN 800 MG PO TABS
800.0000 mg | ORAL_TABLET | Freq: Once | ORAL | Status: AC
  Administered 2015-07-02: 800 mg via ORAL
  Filled 2015-07-02: qty 1

## 2015-07-02 MED ORDER — DOCUSATE SODIUM 100 MG PO CAPS
100.0000 mg | ORAL_CAPSULE | Freq: Two times a day (BID) | ORAL | Status: DC
  Administered 2015-07-02 – 2015-07-03 (×3): 100 mg via ORAL
  Filled 2015-07-02 (×4): qty 1

## 2015-07-02 MED ORDER — ONDANSETRON HCL 4 MG/2ML IJ SOLN: 4.0000 mg | Freq: Four times a day (QID) | INTRAMUSCULAR | Status: DC | PRN

## 2015-07-02 MED ORDER — GUAIFENESIN ER 600 MG PO TB12
1200.0000 mg | ORAL_TABLET | Freq: Two times a day (BID) | ORAL | Status: DC
  Administered 2015-07-02 – 2015-07-03 (×3): 1200 mg via ORAL
  Filled 2015-07-02 (×3): qty 2

## 2015-07-02 MED ORDER — AZITHROMYCIN 250 MG PO TABS
250.0000 mg | ORAL_TABLET | Freq: Every day | ORAL | Status: AC
  Administered 2015-07-02 – 2015-07-03 (×2): 250 mg via ORAL
  Filled 2015-07-02 (×2): qty 1

## 2015-07-02 MED ORDER — MOMETASONE FURO-FORMOTEROL FUM 200-5 MCG/ACT IN AERO
2.0000 | INHALATION_SPRAY | Freq: Two times a day (BID) | RESPIRATORY_TRACT | Status: DC
  Administered 2015-07-02 – 2015-07-03 (×3): 2 via RESPIRATORY_TRACT
  Filled 2015-07-02: qty 8.8

## 2015-07-02 NOTE — Discharge Summary (Signed)
Physician Discharge Summary  Derek Pacheco [id number] DOB: 1965/08/28 DOA: 07/01/2015  PCP: Robyn Haber, MD  Admit date: 07/01/2015 Discharge date: 07/03/2015  Time spent: 35 minutes  Recommendations for Outpatient Follow-up:  1. Follow up with PCP in 1-2 weeks. 2. Follow up appt as scheduled 07/07/15 for Myocardial Perfusion Study.  Discharge Diagnoses:  Principal Problem:   Status asthmaticus with COPD (chronic obstructive pulmonary disease) ([place]) Active Problems:   Second hand tobacco smoke exposure   Status asthmaticus, allergic   Discharge Condition: Improved   Diet recommendation: Heart healthy   Filed Weights   07/01/15 2119 07/02/15 0128  Weight: 122.018 kg (269 lb) 121.11 kg (267 lb)    History of present illness:  50 yom with past medical history of asthma, bronchitis, and a hx of smoking who presented to the ED with complaints of sudden onset, constant, and worsening chest pain and shortness of breath that began approximately 2 weeks ago. While in the ED, workup showed BNP and troponin wnl. CXR showed no active disease. Hospitalist was asked to admit for management of COPD exacerbation.  Hospital Course:  Patient was admitted for treatment of status asthmaticus with chronic bronchitis. He was started on steroids, nebs, and oral abx. With improvement in wheezing and SOB. On discharge he was started on steroid tapers and will complete abx course. Will also discharge with nebulizer.  During admission he was noted to have transient SVT, believed to be related to hypoxia, since pt had episode while in bathroom off oxygen. Could also be adverse effect of bronchodilators. No further episodes on discharge. He denied any chest pain or palpation. ECHO results below.   Procedures:  ECHO Study Conclusions  - Left ventricle: The cavity size was normal. Wall thickness was  increased in a pattern of mild LVH. Systolic function was  vigorous. The estimated ejection  fraction was in the range of 65%  to 70%. Wall motion was normal; there were no regional wall  motion abnormalities. Doppler parameters are consistent with  abnormal left ventricular relaxation (grade 1 diastolic  dysfunction). - Aortic valve: Mildly calcified annulus. Normal thickness  leaflets. Valve area (VTI): 2.41 cm^2. Valve area (Vmax): 2.52  cm^2. Valve area (Vmean): 3.83 cm^2. - Mitral valve: Mildly calcified annulus. Normal thickness leaflets  . - Atrial septum: There was increased thickness of the septum,  consistent with lipomatous hypertrophy. - Technically difficult study. Echocontrast was used to enhance  visualization  Consultations:  None  Discharge Exam: Filed Vitals:   07/03/15 1413 07/03/15 1418  BP: 141/79 156/92  Pulse: 100 99  Temp:    Resp:      General: NAD, looks comfortable Cardiovascular: RRR, S1, S2  Respiratory: Mild wheezes bilaterally  Abdomen: soft, non tender, no distention , bowel sounds normal Musculoskeletal: No edema b/l  Discharge Instructions   Discharge Instructions    DME Nebulizer machine    Complete by:  As directed      Diet - low sodium heart healthy    Complete by:  As directed      Increase activity slowly    Complete by:  As directed           Current Discharge Medication List    START taking these medications   Details  albuterol (PROVENTIL) (2.5 MG/3ML) 0.083% nebulizer solution Take 3 mLs (2.5 mg total) by nebulization every 2 (two) hours as needed for wheezing. Qty: 75 mL, Refills: 12    guaiFENesin (MUCINEX) 600 MG 12 hr tablet Take  2 tablets (1,200 mg total) by mouth 2 (two) times daily. Qty: 30 tablet, Refills: 0    predniSONE (DELTASONE) 10 MG tablet Take 40mg  po daily for 2 days then 30mg  daily for 2 days then 20mg  daily for 2 days then 10mg  daily for 2 days then stop Qty: 20 tablet, Refills: 0      CONTINUE these medications which have NOT CHANGED   Details  acyclovir ointment (ZOVIRAX)  5 % Use every 4-6 hours as needed for cold sores Qty: 15 g, Refills: 1   Associated Diagnoses: History of cold sores    albuterol (PROVENTIL HFA;VENTOLIN HFA) 108 (90 Base) MCG/ACT inhaler Inhale 2 puffs into the lungs every 6 (six) hours as needed for wheezing or shortness of breath.    alprazolam (XANAX) 2 MG tablet Take 1 tablet (2 mg total) by mouth at bedtime as needed for sleep. Qty: 30 tablet, Refills: 5   Associated Diagnoses: Insomnia    budesonide-formoterol (SYMBICORT) 160-4.5 MCG/ACT inhaler Inhale 2 puffs into the lungs 2 (two) times daily. Qty: 1 Inhaler, Refills: 12   Associated Diagnoses: Asthma, mild persistent, uncomplicated    promethazine (PHENERGAN) 12.5 MG tablet Take 1 tablet (12.5 mg total) by mouth every 8 (eight) hours as needed for nausea or vomiting. Qty: 30 tablet, Refills: 3   Associated Diagnoses: Migraine with aura and without status migrainosus, not intractable    Pseudoephedrine-Ibuprofen (ADVIL COLD & SINUS LIQUI-GELS) 30-200 MG CAPS Take 1 capsule by mouth daily as needed (FOR COLD).    SUMAtriptan (IMITREX) 100 MG tablet Take 1/2 or 1 tablet as needed for migraine.  Max 200 mg in 24 hours Qty: 30 tablet, Refills: 2   Associated Diagnoses: Migraine with aura and without status migrainosus, not intractable    topiramate (TOPAMAX) 50 MG tablet Take 1 tablet (50 mg total) by mouth at bedtime. Qty: 30 tablet, Refills: 11   Associated Diagnoses: Migraine with aura and without status migrainosus, not intractable    traZODone (DESYREL) 100 MG tablet Take 1 tablet (100 mg total) by mouth at bedtime. Qty: 30 tablet, Refills: 5   Associated Diagnoses: Insomnia    fluticasone (FLONASE) 50 MCG/ACT nasal spray Place 2 sprays into both nostrils daily as needed for allergies or rhinitis.      STOP taking these medications     azithromycin (ZITHROMAX) 250 MG tablet        No Known Allergies    The results of significant diagnostics from this  hospitalization (including imaging, microbiology, ancillary and laboratory) are listed below for reference.    Significant Diagnostic Studies: Dg Chest 2 View  06/27/2015  CLINICAL DATA:  50 year old male with shortness of breath for 1 week. EXAM: CHEST  2 VIEW COMPARISON:  05/30/2009 FINDINGS: The cardiomediastinal silhouette is unremarkable. Mild peribronchial thickening again. There is no evidence of focal airspace disease, pulmonary edema, suspicious pulmonary nodule/mass, pleural effusion, or pneumothorax. No acute bony abnormalities are identified. IMPRESSION: No evidence of acute cardiopulmonary disease. Mild chronic peribronchial thickening. Electronically Signed   By: Margarette Canada M.D.   On: 06/27/2015 17:53   Dg Chest Port 1 View  07/01/2015  CLINICAL DATA:  Increasing dyspnea and chest congestion over the past 2 weeks. Abrupt worsening tonight. EXAM: PORTABLE CHEST 1 VIEW COMPARISON:  06/27/2015 FINDINGS: A single AP portable view of the chest demonstrates no focal airspace consolidation or alveolar edema. The lungs are grossly clear. There is no large effusion or pneumothorax. Cardiac and mediastinal contours appear unremarkable. IMPRESSION: No  active disease. Electronically Signed   By: Andreas Newport M.D.   On: 07/01/2015 22:32    Microbiology: No results found for this or any previous visit (from the past 240 hour(s)).   Labs: Basic Metabolic Panel:  Recent Labs Lab 06/27/15 1844 07/01/15 2216 07/02/15 0450  NA 140 138 138  K 3.6 4.2 3.2*  CL 105 100* 102  CO2 22 29 24   GLUCOSE 109* 117* 237*  BUN 17 13 12   CREATININE 0.98 0.99 0.95  CALCIUM 9.4 9.0 9.1   Liver Function Tests:  Recent Labs Lab 06/27/15 1844  AST 34  ALT 37  ALKPHOS 86  BILITOT 1.4*  PROT 7.5  ALBUMIN 4.5    Recent Labs Lab 06/27/15 1844  LIPASE 18   CBC:  Recent Labs Lab 06/27/15 1951 07/01/15 2216 07/02/15 0450  WBC 7.2 8.1 6.6  HGB 15.1 15.2 14.4  HCT 44.4 44.2 42.8  MCV  87.9 88.6 88.4  PLT 210 189 204   Cardiac Enzymes:  Recent Labs Lab 07/01/15 2216  TROPONINI <0.03   BNP: BNP (last 3 results)  Recent Labs  07/01/15 2216  BNP 21.0     Signed:  Kathie Dike, MD  [facility] 07/03/2015, 3:20 PM    By signing my name below, I, Rennis Harding, attest that this documentation has been prepared under the direction and in the presence of Kathie Dike, MD. Electronically signed: Rennis Harding, Scribe. 07/03/2015 1:40pm  I, Dr. Kathie Dike, personally performed the services described in this documentaiton. All medical record entries made by the scribe were at my direction and in my presence. I have reviewed the chart and agree that the record reflects my personal performance and is accurate and complete  Kathie Dike, MD, 07/03/2015 3:20 PM

## 2015-07-02 NOTE — Telephone Encounter (Signed)
Left message on voicemail in reference to upcoming appointment scheduled for 07/07/15. Phone number given for a call back so details instructions can be given. Hubbard Robinson, RN

## 2015-07-02 NOTE — Care Management Note (Signed)
Case Management Note  Patient Details  Name: Derek Pacheco MRN: [id number] Date of Birth: 10/22/1965  Subjective/Objective:       Spoke with patient who is alert and oriented from home. Stated that they can afford medications and are able to make thier medical appointments without difficulty. No DME or O2 at home. No CM needs identified.             Action/Plan: Home with self care.   Expected Discharge Date:                  Expected Discharge Plan:  Home/Self Care  In-House Referral:     Discharge planning Services  CM Consult  Post Acute Care Choice:    Choice offered to:     DME Arranged:    DME Agency:     HH Arranged:    [place] Agency:     Status of Service:  Completed, signed off  Medicare Important Message Given:    Date Medicare IM Given:    Medicare IM give by:    Date Additional Medicare IM Given:    Additional Medicare Important Message give by:     If discussed at [place] of Stay Meetings, dates discussed:    Additional Comments:  Alvie Heidelberg, RN 07/02/2015, 10:58 AM

## 2015-07-02 NOTE — Progress Notes (Signed)
Inpatient Diabetes Program Recommendations  AACE/ADA: New Consensus Statement on Inpatient Glycemic Control (2015)  Target Ranges:  Prepandial:   less than 140 mg/dL      Peak postprandial:   less than 180 mg/dL (1-2 hours)      Critically ill patients:  140 - 180 mg/dL  Results for SARGENT, GAPP (MRN [id number]) as of 07/02/2015 09:33  Ref. Range 07/01/2015 22:16 07/02/2015 04:50  Glucose Latest Ref Range: 65-99 mg/dL 117 (H) 237 (H)  Review of Glycemic Control  Diabetes history: No Outpatient Diabetes medications: NA Current orders for Inpatient glycemic control: None  Inpatient Diabetes Program Recommendations: Correction (SSI): Lab glucose 237 mg/dl this morning (likely due to steroids). While inpatient and ordered steroids, may want to consider ordering CBGs with Novolog correction scale.   Thanks, Barnie Alderman, RN, MSN, CDE Diabetes Coordinator Inpatient Diabetes Program [phone] (Team Pager from [place] to [place]) [phone] ([facility] office) [phone] [facility] office) [phone] [facility] office)

## 2015-07-02 NOTE — ED Notes (Signed)
Admit MD at bedside

## 2015-07-02 NOTE — Progress Notes (Signed)
[facility] PROGRESS NOTE  Derek Pacheco [id number] DOB: 1966-01-15 DOA: 07/01/2015 PCP: Robyn Haber, MD  Assessment/Plan: 1. Status asthmaticus with Chronic bronchitis . Started on steroids, nebs, and oral abx. Has continued wheezing and SOB. Will continue current treatments. 2. Transient SVT. Possibly related to hypoxia, since pt had episode while in bathroom off oxygen. Could also be adverse effect of bronchodilators. Denies any chest pain or palpation. Follow up ECHO.  3. Hypokalemia, replace.   Code Status: Full DVT prophylaxis: Lovenox  Family Communication: No family bedside.  Disposition Plan: Anticipate discharge in 24 hours   Consultants:  None  Procedures:  None  Antibiotics:  Zithromax 4/5>>  HPI/Subjective: Feels a lot better. Wheeze has greatly improved since yesterday. Able to ambulate without difficulty. Denies having issues with heart in the past. 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    Objective: Filed Vitals:   07/02/15 0131 07/02/15 1000  BP: 132/80 147/85  Pulse: 95 106  Temp: 98.1 F (36.7 C) 97.7 F (36.5 C)  Resp: 18     Intake/Output Summary (Last 24 hours) at 07/02/15 1155 Last data filed at 07/02/15 0958  Gross per 24 hour  Intake    240 ml  Output      0 ml  Net    240 ml   Filed Weights   07/01/15 2119 07/02/15 0128  Weight: 122.018 kg (269 lb) 121.11 kg (267 lb)    Exam: General: NAD, looks comfortable Cardiovascular: RRR, S1, S2  Respiratory: bilateral wheezing, no rales or rhonchi Abdomen: soft, non tender, no distention , bowel sounds normal Musculoskeletal: No edema b/l   Data Reviewed: Basic Metabolic Panel:  Recent Labs Lab 06/27/15 1844 07/01/15 2216 07/02/15 0450  NA 140 138 138  K 3.6 4.2 3.2*  CL 105 100* 102  CO2 22 29 24   GLUCOSE 109* 117* 237*  BUN 17 13 12   CREATININE 0.98 0.99 0.95  CALCIUM 9.4 9.0 9.1   Liver Function Tests:  Recent Labs Lab 06/27/15 1844   AST 34  ALT 37  ALKPHOS 86  BILITOT 1.4*  PROT 7.5  ALBUMIN 4.5    Recent Labs Lab 06/27/15 1844  LIPASE 18   CBC:  Recent Labs Lab 06/27/15 1951 07/01/15 2216 07/02/15 0450  WBC 7.2 8.1 6.6  HGB 15.1 15.2 14.4  HCT 44.4 44.2 42.8  MCV 87.9 88.6 88.4  PLT 210 189 204   Cardiac Enzymes:  Recent Labs Lab 07/01/15 2216  TROPONINI <0.03   BNP (last 3 results)  Recent Labs  07/01/15 2216  BNP 21.0    Studies: Dg Chest Port 1 View  07/01/2015  CLINICAL DATA:  Increasing dyspnea and chest congestion over the past 2 weeks. Abrupt worsening tonight. EXAM: PORTABLE CHEST 1 VIEW COMPARISON:  06/27/2015 FINDINGS: A single AP portable view of the chest demonstrates no focal airspace consolidation or alveolar edema. The lungs are grossly clear. There is no large effusion or pneumothorax. Cardiac and mediastinal contours appear unremarkable. IMPRESSION: No active disease. Electronically Signed   By: Andreas Newport M.D.   On: 07/01/2015 22:32    Scheduled Meds: . albuterol  2.5 mg Nebulization Q4H  . aspirin EC  81 mg Oral Daily  . azithromycin  250 mg Oral Daily  . docusate sodium  100 mg Oral BID  . enoxaparin (LOVENOX) injection  40 mg Subcutaneous Q24H  . methylPREDNISolone (SOLU-MEDROL) injection  40 mg Intravenous Q6H  . mometasone-formoterol  2 puff Inhalation BID  .  sodium chloride flush  3 mL Intravenous Q12H  . topiramate  50 mg Oral QHS  . traZODone  100 mg Oral QHS   Continuous Infusions:   Principal Problem:   Status asthmaticus with COPD (chronic obstructive pulmonary disease) ([facility]) Active Problems:   Second hand tobacco smoke exposure   Status asthmaticus, allergic    Time spent: 30mins    Kathie Dike, MD  [facility] Pager [phone]. If 7PM-7AM, please contact night-coverage at [email or web address], password [facility] 07/02/2015, 11:55 AM      By signing my name below, I, Rennis Harding, attest that this documentation has been prepared  under the direction and in the presence of Kathie Dike, MD. Electronically signed: Rennis Harding, Scribe. 07/02/2015 2:40pm   I, Dr. Kathie Dike, personally performed the services described in this documentaiton. All medical record entries made by the scribe were at my direction and in my presence. I have reviewed the chart and agree that the record reflects my personal performance and is accurate and complete  Kathie Dike, MD, 07/02/2015 2:58 PM

## 2015-07-03 ENCOUNTER — Telehealth (HOSPITAL_COMMUNITY): Payer: Self-pay | Admitting: *Deleted

## 2015-07-03 DIAGNOSIS — J441 Chronic obstructive pulmonary disease with (acute) exacerbation: Secondary | ICD-10-CM | POA: Insufficient documentation

## 2015-07-03 MED ORDER — GUAIFENESIN ER 600 MG PO TB12: 1200.0000 mg | ORAL_TABLET | Freq: Two times a day (BID) | ORAL | Status: DC

## 2015-07-03 MED ORDER — SUMATRIPTAN SUCCINATE 50 MG PO TABS
50.0000 mg | ORAL_TABLET | ORAL | Status: DC | PRN
  Administered 2015-07-03: 100 mg via ORAL
  Filled 2015-07-03 (×2): qty 1

## 2015-07-03 MED ORDER — ALBUTEROL SULFATE (2.5 MG/3ML) 0.083% IN NEBU: 2.5000 mg | INHALATION_SOLUTION | RESPIRATORY_TRACT | Status: DC | PRN

## 2015-07-03 MED ORDER — PREDNISONE 10 MG PO TABS: ORAL_TABLET | ORAL | Status: DC

## 2015-07-03 NOTE — Care Management (Signed)
Patient does not qualify for home O2 sats remained above 90 while ambulating with nurse. Nebulizer machine ordered from [facility] to be delivered to room prior to discharge.

## 2015-07-03 NOTE — Telephone Encounter (Signed)
Left message on voicemail per DPR in reference to upcoming appointment scheduled on 07/07/15 at 7:45 with detailed instructions given per Myocardial Perfusion Study Information Sheet for the test. LM to arrive 15 minutes early, and that it is imperative to arrive on time for appointment to keep from having the test rescheduled. If you need to cancel or reschedule your appointment, please call the office within 24 hours of your appointment. Failure to do so may result in a cancellation of your appointment, and a $50 no show fee. Phone number given for call back for any questions.

## 2015-07-03 NOTE — Progress Notes (Signed)
SATURATION QUALIFICATIONS: (This note is used to comply with regulatory documentation for home oxygen)  Patient Saturations on Room Air at Rest = 94%  Patient Saturations on Room Air while Ambulating = 93%  Patient Saturations on 0Liters of oxygen while Ambulating = 93%  Please briefly explain why patient needs home oxygen: Not needed

## 2015-07-03 NOTE — Progress Notes (Signed)
Discharge instructions read to pt and his family,  Both verbalized understanding of all instructions,  Discharged to home with family,

## 2015-07-07 ENCOUNTER — Ambulatory Visit (HOSPITAL_COMMUNITY): Payer: BLUE CROSS/BLUE SHIELD | Attending: Cardiology

## 2015-07-07 DIAGNOSIS — R0602 Shortness of breath: Secondary | ICD-10-CM | POA: Diagnosis not present

## 2015-07-07 DIAGNOSIS — R9439 Abnormal result of other cardiovascular function study: Secondary | ICD-10-CM | POA: Insufficient documentation

## 2015-07-07 DIAGNOSIS — Z87891 Personal history of nicotine dependence: Secondary | ICD-10-CM | POA: Diagnosis not present

## 2015-07-07 DIAGNOSIS — R0789 Other chest pain: Secondary | ICD-10-CM | POA: Insufficient documentation

## 2015-07-07 DIAGNOSIS — R0600 Dyspnea, unspecified: Secondary | ICD-10-CM | POA: Diagnosis not present

## 2015-07-07 LAB — MYOCARDIAL PERFUSION IMAGING
CHL CUP NUCLEAR SRS: 6
CHL CUP NUCLEAR SSS: 7
CSEPED: 10 min
Estimated workload: 9 METS
Exercise duration (sec): 3 s
LV dias vol: 127 mL (ref 62–150)
LV sys vol: 57 mL
MPHR: 169 {beats}/min
Peak HR: 130 {beats}/min
Percent HR: 76 %
RATE: 0.31
RPE: 18
Rest HR: 81 {beats}/min
SDS: 1
TID: 0.92

## 2015-07-07 MED ORDER — TECHNETIUM TC 99M SESTAMIBI GENERIC - CARDIOLITE
10.7000 | Freq: Once | INTRAVENOUS | Status: AC | PRN
  Administered 2015-07-07: 11 via INTRAVENOUS

## 2015-07-07 MED ORDER — TECHNETIUM TC 99M SESTAMIBI GENERIC - CARDIOLITE
32.4000 | Freq: Once | INTRAVENOUS | Status: AC | PRN
  Administered 2015-07-07: 32 via INTRAVENOUS

## 2015-07-07 MED ORDER — REGADENOSON 0.4 MG/5ML IV SOLN
0.4000 mg | Freq: Once | INTRAVENOUS | Status: AC
  Administered 2015-07-07: 0.4 mg via INTRAVENOUS

## 2015-07-10 ENCOUNTER — Telehealth: Payer: Self-pay | Admitting: Cardiology

## 2015-07-10 NOTE — Telephone Encounter (Signed)
Reviewed preliminary myoview results with pt who states understanding.  He is aware he will be called back with any further orders or recommendations.  Pt has a follow up with his PCP tomorrow.

## 2015-07-10 NOTE — Telephone Encounter (Signed)
New Message:   Pt wants to know if his stress test results are back from Monday. He would like the results, so he can take it to another doctor tomorrow if possible.

## 2015-07-12 ENCOUNTER — Ambulatory Visit (INDEPENDENT_AMBULATORY_CARE_PROVIDER_SITE_OTHER): Payer: BLUE CROSS/BLUE SHIELD | Admitting: Family Medicine

## 2015-07-12 VITALS — BP 128/82 | HR 82 | Temp 98.2°F | Resp 18 | Ht 72.0 in | Wt 266.2 lb

## 2015-07-12 DIAGNOSIS — J209 Acute bronchitis, unspecified: Secondary | ICD-10-CM | POA: Diagnosis not present

## 2015-07-12 DIAGNOSIS — B370 Candidal stomatitis: Secondary | ICD-10-CM | POA: Diagnosis not present

## 2015-07-12 MED ORDER — FLUCONAZOLE 150 MG PO TABS: 150.0000 mg | ORAL_TABLET | Freq: Once | ORAL | Status: DC

## 2015-07-12 NOTE — Patient Instructions (Addendum)
Thrush, Adult  Thrush, also called oral candidiasis, is a fungal infection that develops in the mouth and throat and on the tongue. It causes white patches to form on the mouth and tongue. Thrush is most common in older adults, but it can occur at any age.   Many cases of thrush are mild, but this infection can also be more serious. Thrush can be a recurring problem for people who have chronic illnesses or who take medicines that limit the body's ability to fight infection. Because these people have difficulty fighting infections, the fungus that causes thrush can spread throughout the body. This can cause life-threatening blood or organ infections.  CAUSES   Thrush is usually caused by a yeast called Candida albicans. This fungus is normally present in small amounts in the mouth and on other mucous membranes. It usually causes no harm. However, when conditions are present that allow the fungus to grow uncontrolled, it invades surrounding tissues and becomes an infection. Less often, other Candida species can also lead to thrush.   RISK FACTORS  Thrush is more likely to develop in the following people:  · People with an impaired ability to fight infection (weakened immune system).    · Older adults.    · People with HIV.    · People with diabetes.    · People with dry mouth (xerostomia).    · Pregnant women.    · People with poor dental care, especially those who have false teeth.    · People who use antibiotic medicines.    SIGNS AND SYMPTOMS   Thrush can be a mild infection that causes no symptoms. If symptoms develop, they may include:   · A burning feeling in the mouth and throat. This can occur at the start of a thrush infection.    · White patches that adhere to the mouth and tongue. The tissue around the patches may be red, raw, and painful. If rubbed (during tooth brushing, for example), the patches and the tissue of the mouth may bleed easily.    · A bad taste in the mouth or difficulty tasting foods.     · Cottony feeling in the mouth.    · Pain during eating and swallowing.  DIAGNOSIS   Your health care provider can usually diagnose thrush by looking in your mouth and asking you questions about your health.   TREATMENT   Medicines that help prevent the growth of fungi (antifungals) are the standard treatment for thrush. These medicines are either applied directly to the affected area (topical) or swallowed (oral). The treatment will depend on the severity of the condition.   Mild Thrush  Mild cases of thrush may clear up with the use of an antifungal mouth rinse or lozenges. Treatment usually lasts about 14 days.   Moderate to Severe Thrush  · More severe thrush infections that have spread to the esophagus are treated with an oral antifungal medicine. A topical antifungal medicine may also be used.    · For some severe infections, a treatment period longer than 14 days may be needed.    · Oral antifungal medicines are almost never used during pregnancy because the fetus may be harmed. However, if a pregnant woman has a rare, severe thrush infection that has spread to her blood, oral antifungal medicines may be used. In this case, the risk of harm to the mother and fetus from the severe thrush infection may be greater than the risk posed by the use of antifungal medicines.    Persistent or Recurrent Thrush  For cases of   thrush that do not go away or keep coming back, treatment may involve the following:   · Treatment may be needed twice as long as the symptoms last.    · Treatment will include both oral and topical antifungal medicines.    · People with weakened immune systems can take an antifungal medicine on a continuous basis to prevent thrush infections.    It is important to treat conditions that make you more likely to get thrush, such as diabetes or HIV.   HOME CARE INSTRUCTIONS   · Only take over-the-counter or prescription medicine as directed by your health care provider. Talk to your health care  provider about an over-the-counter medicine called gentian violet, which kills bacteria and fungi.    · Eat plain, unflavored yogurt as directed by your health care provider. Check the label to make sure the yogurt contains live cultures. This yogurt can help healthy bacteria grow in the mouth that can stop the growth of the fungus that causes thrush.    · Try these measures to help reduce the discomfort of thrush:      Drink cold liquids such as water or iced tea.      Try flavored ice treats or frozen juices.      Eat foods that are easy to swallow, such as gelatin, ice cream, or custard.      If the patches in your mouth are painful, try drinking from a straw.    · Rinse your mouth several times a day with a warm saltwater rinse. You can make the saltwater mixture with 1 tsp (6 g) of salt in 8 fl oz (0.2 L) of warm water.    · If you wear dentures, remove the dentures before going to bed, brush them vigorously, and soak them in a cleaning solution as directed by your health care provider.    · Women who are breastfeeding should clean their nipples with an antifungal medicine as directed by their health care provider. Dry the nipples after breastfeeding. Applying lanolin-containing body lotion may help relieve nipple soreness.    SEEK MEDICAL CARE IF:  · Your symptoms are getting worse or are not improving within 7 days of starting treatment.    · You have symptoms of spreading infection, such as white patches on the skin outside of the mouth.    · You are nursing and you have redness, burning, or pain in the nipples that is not relieved with treatment.    MAKE SURE YOU:  · Understand these instructions.  · Will watch your condition.  · Will get help right away if you are not doing well or get worse.     This information is not intended to replace advice given to you by your health care provider. Make sure you discuss any questions you have with your health care provider.     Document Released: 12/09/2003 Document  Revised: 04/05/2014 Document Reviewed: 10/16/2012  Elsevier Interactive Patient Education ©2016 [employer]

## 2015-07-12 NOTE — Progress Notes (Signed)
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  He has been doing the nebs and Symbicort.  The nurse called from insurance company and suggested he get a flu shot.  When patient first went to [facility] the ER, he stayed 12 hours, received one breathing treatment, and was told he would be evaluated by walking around on a monitor. He never had the latter. He went home, became more short of breath and then went to [facility] where he was admitted. He's been doing better since.  He complains about a source tongue and throat and wonders if he doesn't have thrush again.  He is here for follow up from ED and stress test.  The preliminary on the latter was okay.  Last prednisone was yesterday.  Still dyspneic with short walks.  Objective:  NAD BP 128/82 mmHg  Pulse 82  Temp(Src) 98.2 F (36.8 C) (Oral)  Resp 18  Ht 6' (1.829 m)  Wt 266 lb 3.2 oz (120.748 kg)  BMI 36.10 kg/m2  SpO2 97% Oroph:  Red tongue, posterior pharynx is red with slight exudative discharge. Chest:  Expiratory wheezes.  Better breath sounds Heart:  Reg, no murmur Ext: no edema  Pulse ox stayed at 97% with walk around the office.  Assessment:  Probable viral exacerbation of COPD  Plan:  Continue current. Oral thrush - Plan: fluconazole (DIFLUCAN) 150 MG tablet  Acute bronchitis, unspecified organism    Robyn Haber, MD

## 2015-07-15 ENCOUNTER — Telehealth: Payer: Self-pay

## 2015-07-15 DIAGNOSIS — K0510 Chronic gingivitis, plaque induced: Secondary | ICD-10-CM

## 2015-07-15 MED ORDER — CHLORHEXIDINE GLUCONATE 0.12 % MT SOLN: 15.0000 mL | Freq: Two times a day (BID) | OROMUCOSAL | Status: DC

## 2015-07-15 NOTE — Telephone Encounter (Signed)
Pt advised.

## 2015-07-15 NOTE — Telephone Encounter (Signed)
Pt was seen for mouth issues and he still have infection in his mouth. May need to have something else called in. Please call pt at [place]

## 2015-07-16 ENCOUNTER — Ambulatory Visit (HOSPITAL_COMMUNITY): Payer: BLUE CROSS/BLUE SHIELD

## 2015-10-03 ENCOUNTER — Ambulatory Visit (INDEPENDENT_AMBULATORY_CARE_PROVIDER_SITE_OTHER): Payer: BLUE CROSS/BLUE SHIELD | Admitting: Physician Assistant

## 2015-10-03 VITALS — BP 135/94 | HR 90 | Temp 98.1°F | Resp 16 | Ht 72.0 in | Wt 268.6 lb

## 2015-10-03 DIAGNOSIS — J4521 Mild intermittent asthma with (acute) exacerbation: Secondary | ICD-10-CM

## 2015-10-03 DIAGNOSIS — R062 Wheezing: Secondary | ICD-10-CM | POA: Diagnosis not present

## 2015-10-03 DIAGNOSIS — IMO0001 Reserved for inherently not codable concepts without codable children: Secondary | ICD-10-CM

## 2015-10-03 DIAGNOSIS — R0989 Other specified symptoms and signs involving the circulatory and respiratory systems: Secondary | ICD-10-CM

## 2015-10-03 DIAGNOSIS — R030 Elevated blood-pressure reading, without diagnosis of hypertension: Secondary | ICD-10-CM

## 2015-10-03 DIAGNOSIS — R05 Cough: Secondary | ICD-10-CM | POA: Diagnosis not present

## 2015-10-03 DIAGNOSIS — R059 Cough, unspecified: Secondary | ICD-10-CM

## 2015-10-03 LAB — POCT CBC
Granulocyte percent: 71.4 %G (ref 37–80)
HCT, POC: 44.9 % (ref 43.5–53.7)
HEMOGLOBIN: 15.9 g/dL (ref 14.1–18.1)
LYMPH, POC: 1.6 (ref 0.6–3.4)
MCH: 30.5 pg (ref 27–31.2)
MCHC: 35.5 g/dL — AB (ref 31.8–35.4)
MCV: 85.9 fL (ref 80–97)
MID (CBC): 0.6 (ref 0–0.9)
MPV: 8.6 fL (ref 0–99.8)
PLATELET COUNT, POC: 202 10*3/uL (ref 142–424)
POC Granulocyte: 5.6 (ref 2–6.9)
POC LYMPH PERCENT: 20.5 %L (ref 10–50)
POC MID %: 8.1 %M (ref 0–12)
RBC: 5.22 M/uL (ref 4.69–6.13)
RDW, POC: 13.6 %
WBC: 7.8 10*3/uL (ref 4.6–10.2)

## 2015-10-03 MED ORDER — ALBUTEROL SULFATE (2.5 MG/3ML) 0.083% IN NEBU
2.5000 mg | INHALATION_SOLUTION | Freq: Once | RESPIRATORY_TRACT | Status: AC
  Administered 2015-10-03: 2.5 mg via RESPIRATORY_TRACT

## 2015-10-03 MED ORDER — PREDNISONE 20 MG PO TABS: ORAL_TABLET | ORAL | Status: AC

## 2015-10-03 MED ORDER — METHYLPREDNISOLONE SODIUM SUCC 125 MG IJ SOLR
125.0000 mg | Freq: Once | INTRAMUSCULAR | Status: AC
  Administered 2015-10-03: 125 mg via INTRAMUSCULAR

## 2015-10-03 MED ORDER — IPRATROPIUM BROMIDE 0.02 % IN SOLN
0.5000 mg | Freq: Once | RESPIRATORY_TRACT | Status: AC
Start: 2015-10-03 — End: 2015-10-03
  Administered 2015-10-03: 0.5 mg via RESPIRATORY_TRACT

## 2015-10-03 MED ORDER — AZITHROMYCIN 250 MG PO TABS: ORAL_TABLET | ORAL | Status: DC

## 2015-10-03 NOTE — Patient Instructions (Addendum)
I you worsen overnight then call 911 or have someone take you to the hospital.  If you are not improving by tomorrow then return to clinic or go to the ED.      IF you received an x-ray today, you will receive an invoice from [facility]. Please contact [facility] at [phone] with questions or concerns regarding your invoice.   IF you received labwork today, you will receive an invoice from [employer]. Please contact [facility] at [phone] with questions or concerns regarding your invoice.   Our billing staff will not be able to assist you with questions regarding bills from these companies.  You will be contacted with the lab results as soon as they are available. The fastest way to get your results is to activate your My Chart account. Instructions are located on the last page of this paperwork. If you have not heard from [place] regarding the results in 2 weeks, please contact this office.

## 2015-10-03 NOTE — Progress Notes (Signed)
10/04/2015 8:28 AM   DOB: 09/09/1965 / MRN: [id number]  SUBJECTIVE:  Derek Pacheco is a 49 y.o. male presenting for cough and chest congestion that started roughly 1 week ago and is worsening.  He has a a history of asthma as well as COPD and has needed hospitalization in the past for these problems, however today he tells me "I'm not there yet," with regard to needing to go to the hospital.  He associates wheezing and chest tightness.  He has been taking his albuterol nebulizers, ICS, and using mucinex with some relief of this symptoms.  He denies chest pain and leg swelling today.   He has No Known Allergies.   He  has a past medical history of Asthma; Insomnia; Migraines; and Bronchitis.    He  reports that he has quit smoking. His smoking use included Cigarettes. He has a 25 pack-year smoking history. He does not have any smokeless tobacco history on file. He reports that he does not drink alcohol or use illicit drugs. He  reports that he currently engages in sexual activity. He reports using the following method of birth control/protection: None. The patient  has no past surgical history on file.  His family history includes Asthma in his mother.  Review of Systems  Constitutional: Negative for fever and chills.  Cardiovascular: Negative for chest pain.  Gastrointestinal: Negative for vomiting.  Skin: Negative for rash.  Neurological: Negative for dizziness and headaches.    Problem list and medications reviewed and updated by myself where necessary, and exist elsewhere in the encounter.   OBJECTIVE:  BP 135/94 mmHg  Pulse 90  Temp(Src) 98.1 F (36.7 C) (Oral)  Resp 16  Ht 6' (1.829 m)  Wt 268 lb 9.6 oz (121.836 kg)  BMI 36.42 kg/m2  SpO2 95%  PF 240 L/min  Physical Exam  Constitutional: He is oriented to person, place, and time. He appears well-developed. He does not appear ill.  Eyes: Conjunctivae and EOM are normal. Pupils are equal, round, and reactive to light.    Cardiovascular: Normal rate.   Pulmonary/Chest: Effort normal. He has wheezes (diffuse inspiratory and expiratory).  Abdominal: He exhibits no distension.  Musculoskeletal: Normal range of motion.  Neurological: He is alert and oriented to person, place, and time. No cranial nerve deficit. Coordination normal.  Skin: Skin is warm and dry. He is not diaphoretic.  Psychiatric: He has a normal mood and affect.  Nursing note and vitals reviewed.   Results for orders placed or performed in visit on 10/03/15  POCT CBC  Result Value Ref Range   WBC 7.8 4.6 - 10.2 K/uL   Lymph, poc 1.6 0.6 - 3.4   POC LYMPH PERCENT 20.5 10 - 50 %L   MID (cbc) 0.6 0 - 0.9   POC MID % 8.1 0 - 12 %M   POC Granulocyte 5.6 2 - 6.9   Granulocyte percent 71.4 37 - 80 %G   RBC 5.22 4.69 - 6.13 M/uL   Hemoglobin 15.9 14.1 - 18.1 g/dL   HCT, POC 44.9 43.5 - 53.7 %   MCV 85.9 80 - 97 fL   MCH, POC 30.5 27 - 31.2 pg   MCHC 35.5 (A) 31.8 - 35.4 g/dL   RDW, POC 13.6 %   Platelet Count, POC 202 142 - 424 K/uL   MPV 8.6 0 - 99.8 fL     Lab Results  Component Value Date   HGBA1C 5.7 05/31/2015   No results  found.  ASSESSMENT AND PLAN  Derek Pacheco was seen today for cough.  Diagnoses and all orders for this visit:  Asthma with exacerbation, mild intermittent: CBC within normal limits however will cover for an atypical.I have discussed his symptoms with him at length.  Given that he has been hospitalized in the the past for similar, however more severe, symptoms per HPI, I have advised that he have a low threshold to call 911 should he worsen. If no better RTC in 12-24 hours.  -     methylPREDNISolone sodium succinate (SOLU-MEDROL) 125 mg/2 mL injection 125 mg; Inject 2 mLs (125 mg total) into the muscle once. -     predniSONE (DELTASONE) 20 MG tablet; Take 3 in the morning for 3 days, then 2 in the morning for 3 days, and then 1 in the morning for 3 days. -     albuterol (PROVENTIL) (2.5 MG/3ML) 0.083% nebulizer  solution 2.5 mg; Take 3 mLs (2.5 mg total) by nebulization once.  Chest congestion -     albuterol (PROVENTIL) (2.5 MG/3ML) 0.083% nebulizer solution 2.5 mg; Take 3 mLs (2.5 mg total) by nebulization once. -     ipratropium (ATROVENT) nebulizer solution 0.5 mg; Take 2.5 mLs (0.5 mg total) by nebulization once.  Cough -     POCT CBC -     azithromycin (ZITHROMAX) 250 MG tablet; Take two tonight and one daily therafter.  Wheezing: Managed with #2  Elevated BP -     Recheck vitals    The patient was advised to call or return to clinic if he does not see an improvement in symptoms, or to seek the care of the closest emergency department if he worsens with the above plan.   Philis Fendt, MHS, PA-C [facility] 10/04/2015 8:28 AM

## 2015-10-04 ENCOUNTER — Telehealth: Payer: Self-pay

## 2015-10-04 ENCOUNTER — Encounter: Payer: Self-pay | Admitting: Physician Assistant

## 2015-10-04 NOTE — Telephone Encounter (Signed)
Patient is doing much better and thank Legrand Como for his excellent work.   Please call again if there was more for the patient   [phone] (M)

## 2015-10-10 ENCOUNTER — Ambulatory Visit (INDEPENDENT_AMBULATORY_CARE_PROVIDER_SITE_OTHER): Payer: BLUE CROSS/BLUE SHIELD | Admitting: Physician Assistant

## 2015-10-10 VITALS — BP 124/84 | HR 81 | Temp 98.1°F | Resp 18 | Ht 72.0 in | Wt 263.4 lb

## 2015-10-10 DIAGNOSIS — R062 Wheezing: Secondary | ICD-10-CM | POA: Diagnosis not present

## 2015-10-10 DIAGNOSIS — J42 Unspecified chronic bronchitis: Secondary | ICD-10-CM | POA: Diagnosis not present

## 2015-10-10 MED ORDER — PEAK FLOW METER DEVI: Status: AC

## 2015-10-10 NOTE — Progress Notes (Signed)
10/10/2015 5:45 PM   DOB: 1965-06-06 / MRN: [id number]  SUBJECTIVE:  Derek Pacheco is a 50 y.o. male presenting for follow up of a an obstructive airway exacerbation.  I saw him 7 days ago and he required 125 im solumedrol, a steroid dose pack, and Azithromycin.  He is a patient of Dr. Pauletta Browns.  He has had cardiac work up which was negative. He has never seen a pulmonologist.  Reports he has had two breathing exacerbations, the first of which required hospitalization. He feels better today and wanted to check in and say thank you.   He has a 25 pack year history of smoking and has quit.  His male partner has also recently taken to smoking outside.    He has No Known Allergies.   He  has a past medical history of Asthma; Insomnia; Migraines; and Bronchitis.    He  reports that he has quit smoking. His smoking use included Cigarettes. He has a 25 pack-year smoking history. He does not have any smokeless tobacco history on file. He reports that he does not drink alcohol or use illicit drugs. He  reports that he currently engages in sexual activity. He reports using the following method of birth control/protection: None. The patient  has no past surgical history on file.  His family history includes Asthma in his mother.  Review of Systems  Constitutional: Negative for fever and chills.  Respiratory: Negative for cough and wheezing.   Cardiovascular: Negative for chest pain.  Gastrointestinal: Negative for nausea.  Skin: Negative for itching and rash.  Neurological: Negative for headaches.    Problem list and medications reviewed and updated by myself where necessary, and exist elsewhere in the encounter.   OBJECTIVE:  BP 124/84 mmHg  Pulse 81  Temp(Src) 98.1 F (36.7 C) (Oral)  Resp 18  Ht 6' (1.829 m)  Wt 263 lb 6.4 oz (119.477 kg)  BMI 35.72 kg/m2  SpO2 96%  PF 390 L/min  Physical Exam  Constitutional: He is oriented to person, place, and time.  Cardiovascular: Normal  rate, regular rhythm and normal heart sounds.   Pulmonary/Chest: Effort normal. No respiratory distress. He has wheezes. He has no rales. He exhibits no tenderness.  Abdominal: Soft. Bowel sounds are normal.  Musculoskeletal: Normal range of motion.  Neurological: He is alert and oriented to person, place, and time.  Skin: Skin is warm.    No results found for this or any previous visit (from the past 72 hour(s)).  No results found.  ASSESSMENT AND PLAN  [name] was seen today for follow-up.  Diagnoses and all orders for this visit:  Wheezing: Mild compared to last I saw him.  He is taking prednisone at this time and is near the end of his taper.  His peak flow is vastly improved an he is not laboring to breath.  He needs to see pulmonology.  I have referred.  He is to start keeping a peak flow diary.  He will call the office should he need prednisone while awaiting referral.  -     Peak Flow Meter ([place]) [place]; Please use daily to keep track of your breathing.  Chronic bronchitis, unspecified chronic bronchitis type ([place]) -     Ambulatory referral to Pulmonology    The patient was advised to call or return to clinic if he does not see an improvement in symptoms, or to seek the care of the closest emergency department if he worsens  with the above plan.   Philis Fendt, MHS, PA-C [facility] 10/10/2015 5:45 PM

## 2015-10-10 NOTE — Patient Instructions (Signed)
     IF you received an x-ray today, you will receive an invoice from [facility]. Please contact [facility] at [phone] with questions or concerns regarding your invoice.   IF you received labwork today, you will receive an invoice from [facility]. Please contact [facility] at [phone] with questions or concerns regarding your invoice.   Our billing staff will not be able to assist you with questions regarding bills from these companies.  You will be contacted with the lab results as soon as they are available. The fastest way to get your results is to activate your My Chart account. Instructions are located on the last page of this paperwork. If you have not heard from us regarding the results in 2 weeks, please contact this office.      

## 2015-10-23 ENCOUNTER — Encounter: Payer: Self-pay | Admitting: Internal Medicine

## 2015-10-23 ENCOUNTER — Ambulatory Visit (INDEPENDENT_AMBULATORY_CARE_PROVIDER_SITE_OTHER): Payer: BLUE CROSS/BLUE SHIELD | Admitting: Internal Medicine

## 2015-10-23 VITALS — BP 148/86 | HR 82 | Ht 72.0 in | Wt 266.8 lb

## 2015-10-23 DIAGNOSIS — Z87898 Personal history of other specified conditions: Secondary | ICD-10-CM

## 2015-10-23 DIAGNOSIS — Z8709 Personal history of other diseases of the respiratory system: Secondary | ICD-10-CM | POA: Diagnosis not present

## 2015-10-23 DIAGNOSIS — Z87891 Personal history of nicotine dependence: Secondary | ICD-10-CM

## 2015-10-23 NOTE — Patient Instructions (Signed)
ICD-9-CM ICD-10-CM   1. Stopped smoking with greater than 20 pack year history V15.82 Z87.891   2. History of wheezing V12.69 Z87.09     Continue symbicort Do full PFT next few weeks Return to see  Me or an APP next few weeks but after completing PFT test  - further adjustments in meds or advise on CT chest or not based on PFT  -consider walk test room air based on PFT

## 2015-10-23 NOTE — Progress Notes (Signed)
Subjective:    Patient ID: Derek Pacheco, male    DOB: February 06, 1966, 50 y.o.   MRN: [id number]  PCP Robyn Haber, MD ' HPI  IOV 10/23/2015  Chief Complaint  Patient presents with  . Advice Only    Referred by Dr. Carlis Abbott for chronic bronchitis.     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 Moved to [facility]. He has a 25 pack smoking history but quit several years ago because of  Obamacare lower premiums and people to quit smoking. 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 Some 6-8 years ago in the fall while walking the dog he developed acute shortness of breath and was hospitalized. At that time he was given a diagnosis of bronchitis. Subsequently was doing well and taking Symbicort as needed till spring 2017. He was then exposed to asbestos dust while at work because of a guidewire mask. Subsequently had exacerbation with shortness of breath, chest tightness and wheezing. It appears at this time he went back on Symbicort on a scheduled basis. He did have an emergency room visit at [facility] was end of March 2017. However his condition worsened and he was admitted to [facility] hospital between 07/01/2015 07/03/2015. Discharge diagnosis was status asthmaticus with COPD. After discharge. Cardiac stress test that was normal and documented below. Over time is continued to get better. However he says maybe a few weeks ago he had another exacerbation which then prompted this referral. Currently he reports baseline albuterol use of one every few days and continued Symbicort usage. He is currently feeling well.   CT chest 08/14/2009: Reported as clear lung fields. I personally visualized this film and agree  Echocardiogram 07/02/2015: Ejection fraction 65%. He has left ventricular hypertrophy and atrial septal thickening and grade 1 diastolic dysfunction   Medicine cardiac stress test  07/07/2015: Low risk stress test  Hemoglobin 10/03/2015: 15.9 g percent   Chest x-ray before 2017: Reported as clear lung fields. I personally visualized the film but suspect there is slight increased interstitial prominence in the bases but it could be vasculature.  [place] [id number]    has a past medical history of Asthma; Bronchitis; Insomnia; and Migraines.   reports that he has quit smoking. His smoking use included Cigarettes. He has a 25.00 pack-year smoking history. He does not have any smokeless tobacco history on file.  No past surgical history on file.  No Known Allergies  Immunization History  Administered Date(s) Administered  . Tdap 05/31/2012    Family History  Problem Relation Age of Onset  . Asthma Mother      Current Outpatient Prescriptions:  .  acyclovir ointment (ZOVIRAX) 5 %, Use every 4-6 hours as needed for cold sores, Disp: 15 g, Rfl: 1 .  albuterol (PROVENTIL HFA;VENTOLIN HFA) 108 (90 Base) MCG/ACT inhaler, Inhale 2 puffs into the lungs every 6 (six) hours as needed for wheezing or shortness of breath., Disp: , Rfl:  .  albuterol (PROVENTIL) (2.5 MG/3ML) 0.083% nebulizer solution, Take 3 mLs (2.5 mg total) by nebulization every 2 (two) hours as needed for wheezing., Disp: 75 mL, Rfl: 12 .  alprazolam (XANAX) 2 MG tablet, Take 1 tablet (2 mg total) by mouth at bedtime as needed for sleep., Disp: 30 tablet, Rfl: 5 .  budesonide-formoterol (SYMBICORT) 160-4.5 MCG/ACT inhaler, Inhale 2 puffs into the lungs 2 (two) times daily., Disp: 1 Inhaler,  Rfl: 12 .  fluticasone (FLONASE) 50 MCG/ACT nasal spray, Place 2 sprays into both nostrils daily as needed for allergies or rhinitis., Disp: , Rfl:  .  Peak Flow Meter (AIRZONE PEAK FLOW METER) [place], Please use daily to keep track of your breathing., Disp: 1 each, Rfl: 0 .  promethazine (PHENERGAN) 12.5 MG tablet, Take 1 tablet (12.5 mg total) by mouth every 8 (eight) hours as needed for nausea or  vomiting., Disp: 30 tablet, Rfl: 3 .  Pseudoephedrine-Ibuprofen (ADVIL COLD & SINUS LIQUI-GELS) 30-200 MG CAPS, Take 1 capsule by mouth daily as needed (FOR COLD)., Disp: , Rfl:  .  SUMAtriptan (IMITREX) 100 MG tablet, Take 1/2 or 1 tablet as needed for migraine.  Max 200 mg in 24 hours (Patient taking differently: Take 50-100 mg by mouth every 2 (two) hours as needed for migraine (Take 1/2 or 1 tablet as needed for migraine.  Max 200 mg in 24 hours). ), Disp: 30 tablet, Rfl: 2 .  topiramate (TOPAMAX) 50 MG tablet, Take 1 tablet (50 mg total) by mouth at bedtime. (Patient taking differently: Take 50 mg by mouth daily as needed (for cluster migraine). ), Disp: 30 tablet, Rfl: 11 .  traZODone (DESYREL) 100 MG tablet, Take 1 tablet (100 mg total) by mouth at bedtime., Disp: 30 tablet, Rfl: 5   Review of Systems  Constitutional: Negative for fever and unexpected weight change.  HENT: Positive for congestion. Negative for dental problem, ear pain, nosebleeds, postnasal drip, rhinorrhea, sinus pressure, sneezing, sore throat and trouble swallowing.   Eyes: Negative for redness and itching.  Respiratory: Positive for shortness of breath and wheezing. Negative for cough and chest tightness.   Cardiovascular: Negative for palpitations and leg swelling.  Gastrointestinal: Negative for nausea and vomiting.  Genitourinary: Negative for dysuria.  Musculoskeletal: Negative for joint swelling.  Skin: Negative for rash.  Neurological: Negative for headaches.  Hematological: Does not bruise/bleed easily.  Psychiatric/Behavioral: Negative for dysphoric mood. The patient is not nervous/anxious.        Objective:   Physical Exam  Constitutional: He is oriented to person, place, and time. He appears well-developed and well-nourished. No distress.  HENT:  Head: Normocephalic and atraumatic.  Right Ear: External ear normal.  Left Ear: External ear normal.  Mouth/Throat: Oropharynx is clear and moist. No  oropharyngeal exudate.  mallampatti class 2-3  Eyes: Conjunctivae and EOM are normal. Pupils are equal, round, and reactive to light. Right eye exhibits no discharge. Left eye exhibits no discharge. No scleral icterus.  Neck: Normal range of motion. Neck supple. No JVD present. No tracheal deviation present. No thyromegaly present.  Cardiovascular: Normal rate, regular rhythm and intact distal pulses.  Exam reveals no gallop and no friction rub.   No murmur heard. Pulmonary/Chest: Effort normal and breath sounds normal. No respiratory distress. He has no wheezes. He has no rales. He exhibits no tenderness.  Abdominal: Soft. Bowel sounds are normal. He exhibits no distension and no mass. There is no tenderness. There is no rebound and no guarding.  Visceral obesity+  Musculoskeletal: Normal range of motion. He exhibits no edema or tenderness.  Lymphadenopathy:    He has no cervical adenopathy.  Neurological: He is alert and oriented to person, place, and time. He has normal reflexes. No cranial nerve deficit. Coordination normal.  Skin: Skin is warm and dry. No rash noted. He is not diaphoretic. No erythema. No pallor.  Psychiatric: He has a normal mood and affect. His behavior is normal. Judgment  and thought content normal.  Nursing note and vitals reviewed.   Vitals:   10/23/15 1130  BP: (!) 148/86  Pulse: 82  SpO2: 98%  Weight: 266 lb 12.8 oz (121 kg)  Height: 6' (1.829 m)    Estimated body mass index is 36.18 kg/m as calculated from the following:   Height as of this encounter: 6' (1.829 m).   Weight as of this encounter: 266 lb 12.8 oz (121 kg).       Assessment & Plan:     ICD-9-CM ICD-10-CM   1. Stopped smoking with greater than 20 pack year history V15.82 Z87.891 Pulmonary function test  2. History of wheezing V12.69 Z87.09 Pulmonary function test   Based on history concern is COPD exacerbations and underlying COPD versus asthma. At this point in time we need to get  pulmonary function test to establish severity and decide on further titration of therapy. He is agreeable to the plan and verbalized understanding   Continue symbicort Do full PFT next few weeks Return to see  Me or an APP next few weeks but after completing PFT test  - further adjustments in meds or advise on CT chest or not based on PFT  -consider walk test room air based on PFT    Dr. Brand Males, M.D., [facility].C.P Pulmonary and Critical Care Medicine Staff Physician [facility] Pager: [phone], If no answer or between  15:00h - 7:00h: call [phone]  10/23/2015 11:59 AM

## 2015-11-05 DIAGNOSIS — D485 Neoplasm of uncertain behavior of skin: Secondary | ICD-10-CM | POA: Diagnosis not present

## 2015-11-05 DIAGNOSIS — D225 Melanocytic nevi of trunk: Secondary | ICD-10-CM | POA: Diagnosis not present

## 2015-11-05 DIAGNOSIS — D2371 Other benign neoplasm of skin of right lower limb, including hip: Secondary | ICD-10-CM | POA: Diagnosis not present

## 2015-11-07 ENCOUNTER — Ambulatory Visit (HOSPITAL_COMMUNITY)
Admission: RE | Admit: 2015-11-07 | Discharge: 2015-11-07 | Disposition: A | Discharge status: Home / Self Care | Payer: BLUE CROSS/BLUE SHIELD | Source: Ambulatory Visit | Attending: Internal Medicine | Admitting: Internal Medicine

## 2015-11-07 DIAGNOSIS — J988 Other specified respiratory disorders: Secondary | ICD-10-CM | POA: Insufficient documentation

## 2015-11-07 DIAGNOSIS — Z8709 Personal history of other diseases of the respiratory system: Secondary | ICD-10-CM | POA: Insufficient documentation

## 2015-11-07 DIAGNOSIS — Z87898 Personal history of other specified conditions: Secondary | ICD-10-CM

## 2015-11-07 DIAGNOSIS — Z87891 Personal history of nicotine dependence: Secondary | ICD-10-CM | POA: Diagnosis not present

## 2015-11-07 LAB — PULMONARY FUNCTION TEST
DL/VA % pred: 72 %
DL/VA: 3.43 ml/min/mmHg/L
DLCO UNC % PRED: 71 %
DLCO unc: 25.1 ml/min/mmHg
FEF 25-75 PRE: 0.58 L/s
FEF 25-75 Post: 1.37 L/sec
FEF2575-%CHANGE-POST: 137 %
FEF2575-%Pred-Post: 38 %
FEF2575-%Pred-Pre: 16 %
FEV1-%Change-Post: 37 %
FEV1-%PRED-POST: 53 %
FEV1-%Pred-Pre: 38 %
FEV1-POST: 2.2 L
FEV1-PRE: 1.6 L
FEV1FVC-%CHANGE-POST: 13 %
FEV1FVC-%Pred-Pre: 63 %
FEV6-%CHANGE-POST: 26 %
FEV6-%PRED-POST: 70 %
FEV6-%PRED-PRE: 56 %
FEV6-PRE: 2.89 L
FEV6-Post: 3.66 L
FEV6FVC-%CHANGE-POST: 4 %
FEV6FVC-%PRED-POST: 95 %
FEV6FVC-%PRED-PRE: 91 %
FVC-%CHANGE-POST: 20 %
FVC-%Pred-Post: 74 %
FVC-%Pred-Pre: 61 %
FVC-Post: 3.97 L
FVC-Pre: 3.29 L
POST FEV6/FVC RATIO: 92 %
Post FEV1/FVC ratio: 55 %
Pre FEV1/FVC ratio: 49 %
Pre FEV6/FVC Ratio: 88 %
RV % PRED: 164 %
RV: 3.61 L
TLC % pred: 95 %
TLC: 7.06 L

## 2015-11-07 MED ORDER — ALBUTEROL SULFATE (2.5 MG/3ML) 0.083% IN NEBU
2.5000 mg | INHALATION_SOLUTION | Freq: Once | RESPIRATORY_TRACT | Status: AC
  Administered 2015-11-07: 2.5 mg via RESPIRATORY_TRACT

## 2015-11-09 ENCOUNTER — Other Ambulatory Visit: Payer: Self-pay | Admitting: Family Medicine

## 2015-11-09 DIAGNOSIS — G43109 Migraine with aura, not intractable, without status migrainosus: Secondary | ICD-10-CM

## 2015-11-11 NOTE — Telephone Encounter (Signed)
Patient of Dr L , he was seen in February migraines was mentioned but no mention of medication.     Last prescription given 12/16 with 3 refills, Do you want to refill or have patient return to clinic

## 2015-11-18 DIAGNOSIS — L98429 Non-pressure chronic ulcer of back with unspecified severity: Secondary | ICD-10-CM | POA: Diagnosis not present

## 2015-11-18 DIAGNOSIS — D485 Neoplasm of uncertain behavior of skin: Secondary | ICD-10-CM | POA: Diagnosis not present

## 2015-11-18 DIAGNOSIS — D225 Melanocytic nevi of trunk: Secondary | ICD-10-CM | POA: Diagnosis not present

## 2015-11-21 ENCOUNTER — Ambulatory Visit (INDEPENDENT_AMBULATORY_CARE_PROVIDER_SITE_OTHER): Payer: BLUE CROSS/BLUE SHIELD | Admitting: Physician Assistant

## 2015-11-21 ENCOUNTER — Ambulatory Visit: Payer: BLUE CROSS/BLUE SHIELD | Admitting: Internal Medicine

## 2015-11-21 VITALS — BP 160/90 | HR 93 | Temp 97.9°F | Resp 18 | Ht 72.0 in | Wt 270.0 lb

## 2015-11-21 DIAGNOSIS — Z8709 Personal history of other diseases of the respiratory system: Secondary | ICD-10-CM | POA: Diagnosis not present

## 2015-11-21 DIAGNOSIS — F17210 Nicotine dependence, cigarettes, uncomplicated: Secondary | ICD-10-CM

## 2015-11-21 DIAGNOSIS — R0789 Other chest pain: Secondary | ICD-10-CM | POA: Diagnosis not present

## 2015-11-21 DIAGNOSIS — Z760 Encounter for issue of repeat prescription: Secondary | ICD-10-CM | POA: Diagnosis not present

## 2015-11-21 DIAGNOSIS — R062 Wheezing: Secondary | ICD-10-CM | POA: Diagnosis not present

## 2015-11-21 DIAGNOSIS — R030 Elevated blood-pressure reading, without diagnosis of hypertension: Secondary | ICD-10-CM | POA: Diagnosis not present

## 2015-11-21 DIAGNOSIS — IMO0001 Reserved for inherently not codable concepts without codable children: Secondary | ICD-10-CM

## 2015-11-21 MED ORDER — TRAZODONE HCL 100 MG PO TABS: 100.0000 mg | ORAL_TABLET | Freq: Every day | ORAL | 3 refills | Status: DC

## 2015-11-21 MED ORDER — ALBUTEROL SULFATE (2.5 MG/3ML) 0.083% IN NEBU
2.5000 mg | INHALATION_SOLUTION | Freq: Once | RESPIRATORY_TRACT | Status: AC
  Administered 2015-11-21: 2.5 mg via RESPIRATORY_TRACT

## 2015-11-21 MED ORDER — IPRATROPIUM BROMIDE 0.02 % IN SOLN
0.5000 mg | Freq: Once | RESPIRATORY_TRACT | Status: AC
  Administered 2015-11-21: 0.5 mg via RESPIRATORY_TRACT

## 2015-11-21 MED ORDER — PREDNISONE 20 MG PO TABS: ORAL_TABLET | ORAL | 0 refills | Status: AC

## 2015-11-21 MED ORDER — DOXYCYCLINE HYCLATE 100 MG PO CAPS: 100.0000 mg | ORAL_CAPSULE | Freq: Two times a day (BID) | ORAL | 0 refills | Status: AC

## 2015-11-21 MED ORDER — IPRATROPIUM BROMIDE 0.02 % IN SOLN: 0.5000 mg | Freq: Four times a day (QID) | RESPIRATORY_TRACT | 12 refills | Status: DC

## 2015-11-21 NOTE — Progress Notes (Signed)
11/21/2015 4:23 PM   DOB: [birth year] / MRN: [id number]  SUBJECTIVE:  Derek Pacheco is a 50 y.o. male presenting for wheezing and SOB.  He has a history of asthma and questionable COPD given his long history of smoking. He is coming in today for SOB that started about 3 days ago.  He has tried his nebs and this did help some but is not getting him back to his baseline.  He checks his PF daily and runs around 550.  For the last two days he has been measuring 300.  He does not have a history of HTN.  Last A1c in non diabetic range 3-4 months ago. He is one daily ICS therapy and does not miss doses.   He has No Known Allergies.   He  has a past medical history of Asthma; Bronchitis; Insomnia; and Migraines.    He  reports that he has quit smoking. His smoking use included Cigarettes. He has a 25.00 pack-year smoking history. He has never used smokeless tobacco. He reports that he does not drink alcohol or use drugs. He  reports that he currently engages in sexual activity. He reports using the following method of birth control/protection: None. The patient  has no past surgical history on file.  His family history includes Asthma in his mother.  Review of Systems  Respiratory: Positive for cough, shortness of breath and wheezing. Negative for hemoptysis and sputum production.   Cardiovascular: Negative for chest pain and leg swelling.  Gastrointestinal: Negative for nausea.  Neurological: Negative for headaches.    The problem list and medications were reviewed and updated by myself where necessary and exist elsewhere in the encounter.   OBJECTIVE:  BP (!) 160/90 (BP Location: Right Arm, Patient Position: Sitting, Cuff Size: Large)   Pulse 93   Temp 97.9 F (36.6 C) (Oral)   Resp 18   Ht 6' (1.829 m)   Wt 270 lb (122.5 kg)   SpO2 96%   BMI 36.62 kg/m   Physical Exam  Constitutional: He is oriented to person, place, and time. He appears distressed.  HENT:  Mouth/Throat: No  oropharyngeal exudate.  Cardiovascular: Normal rate, regular rhythm and normal heart sounds.   Pulmonary/Chest: He has wheezes (improved with nebs). He has no rales.  Musculoskeletal: Normal range of motion.  Neurological: He is alert and oriented to person, place, and time.  Skin: He is not diaphoretic.  Psychiatric: He has a normal mood and affect.    No results found for this or any previous visit (from the past 72 hour(s)).  No results found.  ASSESSMENT AND PLAN  Derek Pacheco was seen today for bronchitis.  Diagnoses and all orders for this visit:  Wheezing: Given problem 4 will cover for a bacterial etiology. RTC or call in 2-3 days if not improving dramatically.  -     albuterol (PROVENTIL) (2.5 MG/3ML) 0.083% nebulizer solution 2.5 mg; Take 3 mLs (2.5 mg total) by nebulization once. -     ipratropium (ATROVENT) nebulizer solution 0.5 mg; Take 2.5 mLs (0.5 mg total) by nebulization once. -     predniSONE (DELTASONE) 20 MG tablet; Take 3 in the morning for 3 days, then 2 in the morning for 3 days, and then 1 in the morning for 3 days. -     ipratropium (ATROVENT) 0.02 % nebulizer solution; Take 2.5 mLs (0.5 mg total) by nebulization 4 (four) times daily.  Chest tightness: See prob 1  History of asthmaa; See  prob 1  Smoking greater than 20 pack years -     doxycycline (VIBRAMYCIN) 100 MG capsule; Take 1 capsule (100 mg total) by mouth 2 (two) times daily.  Elevated BP: Will get an ambulatory log.  He has a cuff at home and does not mind doing this.   Medication refill -     traZODone (DESYREL) 100 MG tablet; Take 1-1.5 tablets (100-150 mg total) by mouth at bedtime.    The patient is advised to call or return to clinic if he does not see an improvement in symptoms, or to seek the care of the closest emergency department if he worsens with the above plan.   Philis Fendt, MHS, PA-C [facility] 11/21/2015 4:23 PM

## 2015-11-21 NOTE — Patient Instructions (Addendum)
Please keep a log of your blood pressure daily and return the numbers to me in one to two weeks.      IF you received an x-ray today, you will receive an invoice from [facility]. Please contact [facility] at [phone] with questions or concerns regarding your invoice.   IF you received labwork today, you will receive an invoice from [employer]. Please contact [facility] at [phone] with questions or concerns regarding your invoice.   Our billing staff will not be able to assist you with questions regarding bills from these companies.  You will be contacted with the lab results as soon as they are available. The fastest way to get your results is to activate your My Chart account. Instructions are located on the last page of this paperwork. If you have not heard from [place] regarding the results in 2 weeks, please contact this office.

## 2015-12-12 ENCOUNTER — Ambulatory Visit (INDEPENDENT_AMBULATORY_CARE_PROVIDER_SITE_OTHER): Payer: BLUE CROSS/BLUE SHIELD | Admitting: Internal Medicine

## 2015-12-12 ENCOUNTER — Encounter: Payer: Self-pay | Admitting: Internal Medicine

## 2015-12-12 ENCOUNTER — Other Ambulatory Visit: Payer: BLUE CROSS/BLUE SHIELD

## 2015-12-12 VITALS — BP 118/86 | HR 81 | Ht 72.0 in | Wt 276.0 lb

## 2015-12-12 DIAGNOSIS — Z23 Encounter for immunization: Secondary | ICD-10-CM | POA: Diagnosis not present

## 2015-12-12 DIAGNOSIS — J449 Chronic obstructive pulmonary disease, unspecified: Secondary | ICD-10-CM

## 2015-12-12 MED ORDER — UMECLIDINIUM BROMIDE 62.5 MCG/INH IN AEPB: 1.0000 | INHALATION_SPRAY | Freq: Every day | RESPIRATORY_TRACT | 11 refills | Status: DC

## 2015-12-12 NOTE — Progress Notes (Signed)
Subjective:     Patient ID: Derek Pacheco, male   DOB: [birth year], 50 y.o.   MRN: [id number]  HPI    PCP Robyn Haber, MD ' HPI  IOV 10/23/2015  Chief Complaint  Patient presents with  . Advice Only    Referred by Dr. Carlis Abbott for chronic bronchitis.     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 Moved to [facility]. He has a 25 pack smoking history but quit several years ago because of  Obamacare lower premiums and people to quit smoking. 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 Some 6-8 years ago in the fall while walking the dog he developed acute shortness of breath and was hospitalized. At that time he was given a diagnosis of bronchitis. Subsequently was doing well and taking Symbicort as needed till spring 2017. He was then exposed to asbestos dust while at work because of a guidewire mask. Subsequently had exacerbation with shortness of breath, chest tightness and wheezing. It appears at this time he went back on Symbicort on a scheduled basis. He did have an emergency room visit at [facility] was end of March 2017. However his condition worsened and he was admitted to [facility] hospital between 07/01/2015 07/03/2015. Discharge diagnosis was status asthmaticus with COPD. After discharge. Cardiac stress test that was normal and documented below. Over time is continued to get better. However he says maybe a few weeks ago he had another exacerbation which then prompted this referral. Currently he reports baseline albuterol use of one every few days and continued Symbicort usage. He is currently feeling well.   CT chest 08/14/2009: Reported as clear lung fields. I personally visualized this film and agree  Echocardiogram 07/02/2015: Ejection fraction 65%. He has left ventricular hypertrophy and atrial septal thickening and grade 1 diastolic dysfunction   Medicine cardiac stress test  07/07/2015: Low risk stress test  Hemoglobin 10/03/2015: 15.9 g percent   Chest x-ray before 2017: Reported as clear lung fields. I personally visualized the film but suspect there is slight increased interstitial prominence in the bases but it could be vasculature.  [place] [id number]    has a past medical history of Asthma; Bronchitis; Insomnia; and Migraines.   reports that he has quit smoking. His smoking use included Cigarettes. He has a 25.00 pack-year smoking history. He does not have any smokeless tobacco history on file.  No past surgical history on file.  No Known Allergies  OV 12/12/2015  Chief Complaint  Patient presents with  . Follow-up    Pt here after PFT to review results. Pt states he had to get a pred taper from PCP due to breathing in dust, pt feels back to baseline. Pt c/o cough with intermittent mucus production - white in color. Pt denies CP/tightness and f/c/s .    Suspected COPD history. He had to review pulmonary function test. Pulmonary function test 11/03/2015 shows FEV1 1.6 L/30% with a ratio 49. Post broncho-dilator FEV1 2.2 L/53% with a 37% rapid response and a ratio 55. DLCO 25.1/71%. Features are consistent with COPD/asthma with bronchodilator response. Then on 11/27/2015 had dust exposure at work and had to be treated for an exacerbation of prednisone. Currently feels baseline. 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 He denies asbestos exposure when asked him about it again.   has a past medical history of Asthma; Bronchitis; Insomnia; and Migraines.   reports  that he has quit smoking. His smoking use included Cigarettes. He has a 25.00 pack-year smoking history. He has never used smokeless tobacco.  No past surgical history on file.  No Known Allergies  Immunization History  Administered Date(s) Administered  . Tdap 05/31/2012    Family History  Problem Relation Age of Onset  . Asthma Mother      Current Outpatient  Prescriptions:  .  acyclovir ointment (ZOVIRAX) 5 %, Use every 4-6 hours as needed for cold sores, Disp: 15 g, Rfl: 1 .  albuterol (PROVENTIL HFA;VENTOLIN HFA) 108 (90 Base) MCG/ACT inhaler, Inhale 2 puffs into the lungs every 6 (six) hours as needed for wheezing or shortness of breath., Disp: , Rfl:  .  albuterol (PROVENTIL) (2.5 MG/3ML) 0.083% nebulizer solution, Take 3 mLs (2.5 mg total) by nebulization every 2 (two) hours as needed for wheezing., Disp: 75 mL, Rfl: 12 .  alprazolam (XANAX) 2 MG tablet, Take 1 tablet (2 mg total) by mouth at bedtime as needed for sleep., Disp: 30 tablet, Rfl: 5 .  budesonide-formoterol (SYMBICORT) 160-4.5 MCG/ACT inhaler, Inhale 2 puffs into the lungs 2 (two) times daily., Disp: 1 Inhaler, Rfl: 12 .  fluticasone (FLONASE) 50 MCG/ACT nasal spray, Place 2 sprays into both nostrils daily as needed for allergies or rhinitis., Disp: , Rfl:  .  ipratropium (ATROVENT) 0.02 % nebulizer solution, Take 2.5 mLs (0.5 mg total) by nebulization 4 (four) times daily., Disp: 75 mL, Rfl: 12 .  Peak Flow Meter (AIRZONE PEAK FLOW METER) DEVI, Please use daily to keep track of your breathing., Disp: 1 each, Rfl: 0 .  promethazine (PHENERGAN) 12.5 MG tablet, TAKE ONE TABLET BY MOUTH EVERY 8 HOURS AS NEEDED FOR NAUSEA AND VOMITING, Disp: 30 tablet, Rfl: 3 .  SUMAtriptan (IMITREX) 100 MG tablet, Take 1/2 or 1 tablet as needed for migraine.  Max 200 mg in 24 hours (Patient taking differently: Take 50-100 mg by mouth every 2 (two) hours as needed for migraine (Take 1/2 or 1 tablet as needed for migraine.  Max 200 mg in 24 hours). ), Disp: 30 tablet, Rfl: 2 .  topiramate (TOPAMAX) 50 MG tablet, Take 1 tablet (50 mg total) by mouth at bedtime. (Patient taking differently: Take 50 mg by mouth daily as needed (for cluster migraine). ), Disp: 30 tablet, Rfl: 11 .  traZODone (DESYREL) 100 MG tablet, Take 1-1.5 tablets (100-150 mg total) by mouth at bedtime., Disp: 135 tablet, Rfl: 3   Review of  Systems     Objective:   Physical Exam  Vitals:   12/12/15 1547  BP: 118/86  Pulse: 81  SpO2: 97%  Weight: 276 lb (125.2 kg)  Height: 6' (1.829 m)   No wheezes or crackles. Exam is nonfocal. Obese gentleman. Discussion only visit    Assessment:     1. COPD, moderate ([place]) COPD, moderate ([place])     ICD-9-CM ICD-10-CM   1. COPD, moderate ([place]) 496 J44.9         Plan:      - You have moderate COPD with possible asthma component  Plan - Continue Symbicort as before - Start Spiriva or INCRUSE daily - Use albuterol as needed - Flu shot today 12/12/2015 - Alpha 1 antitrypsin phenotype blood test today  Follow-up 3 months or sooner with COPD cat score at follow-up  -  (> 50% of this 15 min visit spent in face to face counseling or/and coordination of care)   Dr. Brand Males, M.D., [facility].C.P Pulmonary  and Critical Care Medicine Staff Physician [facility] Pager: [phone], If no answer or between  15:00h - 7:00h: call [phone]  12/12/2015 4:29 PM

## 2015-12-12 NOTE — Patient Instructions (Signed)
1. COPD, moderate ([place])  - You have moderate COPD with possible asthma component  Plan - Continue Symbicort as before - Start Spiriva or INCRUSE daily - Use albuterol as needed - Flu shot today 12/12/2015 - Alpha 1 antitrypsin phenotype blood test today  Follow-up 3 months or sooner with COPD cat score at follow-up  -

## 2015-12-18 LAB — ALPHA-1 ANTITRYPSIN PHENOTYPE: A-1 Antitrypsin: 140 mg/dL (ref 83–199)

## 2015-12-22 NOTE — Progress Notes (Signed)
Called and spoke to pt. Informed him of the results and recs per MR. Pt verbalized understanding and denied any further questions or concerns at this time.  

## 2015-12-26 ENCOUNTER — Ambulatory Visit: Payer: BLUE CROSS/BLUE SHIELD

## 2015-12-27 ENCOUNTER — Ambulatory Visit (INDEPENDENT_AMBULATORY_CARE_PROVIDER_SITE_OTHER): Payer: BLUE CROSS/BLUE SHIELD | Admitting: Physician Assistant

## 2015-12-27 VITALS — BP 140/92 | HR 76 | Temp 98.1°F | Resp 18 | Ht 72.0 in | Wt 270.0 lb

## 2015-12-27 DIAGNOSIS — IMO0001 Reserved for inherently not codable concepts without codable children: Secondary | ICD-10-CM

## 2015-12-27 DIAGNOSIS — R0981 Nasal congestion: Secondary | ICD-10-CM

## 2015-12-27 DIAGNOSIS — R030 Elevated blood-pressure reading, without diagnosis of hypertension: Secondary | ICD-10-CM | POA: Diagnosis not present

## 2015-12-27 DIAGNOSIS — Z760 Encounter for issue of repeat prescription: Secondary | ICD-10-CM | POA: Diagnosis not present

## 2015-12-27 DIAGNOSIS — G4700 Insomnia, unspecified: Secondary | ICD-10-CM | POA: Diagnosis not present

## 2015-12-27 DIAGNOSIS — Z8709 Personal history of other diseases of the respiratory system: Secondary | ICD-10-CM | POA: Diagnosis not present

## 2015-12-27 MED ORDER — ALPRAZOLAM 2 MG PO TABS: 2.0000 mg | ORAL_TABLET | Freq: Every evening | ORAL | 5 refills | Status: DC | PRN

## 2015-12-27 MED ORDER — ALBUTEROL SULFATE (2.5 MG/3ML) 0.083% IN NEBU: 2.5000 mg | INHALATION_SOLUTION | RESPIRATORY_TRACT | 12 refills | Status: DC | PRN

## 2015-12-27 NOTE — Progress Notes (Signed)
Patient ID: Derek Pacheco, male    DOB: 20-Jan-1966, 50 y.o.   MRN: [id number]  PCP: Derek Haber, MD  Chief Complaint  Patient presents with  . Nasal Congestion  . Medication Refill    Xanax, albuterol sulfate liquid    Subjective:   [name] presents with above complaints.   Xanax refill - uses for insomnia and sleep onset. Has been taking for approx one year. Takes 2 mg xanax most nights. Also takes 100 mg trazodone nightly. Combination works well for him. Xanax last refilled by [place], Dr Derek Pacheco, six months ago in March 2017.   Also needing refill of his albuterol solution for his home neb. Has been seen by [place] several times recently for asthma flares. Denies current flare, no current SOB, wheezing.   Mentions few days of head congestion. Taking Coricidin at home without much relief. Denies fevers, chills, facial pain, excessive nasal drainage. Has not been using nasal sprays or mucinex.   BP 140/92 today. Chart review shows frequently moderately elevated at clinic. Pt states he takes at home where it typically runs < 140/90. No HTN meds. Denies cp, sob.   Patient Active Problem List   Diagnosis Date Noted  . Stopped smoking with greater than 20 pack year history 10/23/2015  . History of wheezing 10/23/2015  . COPD exacerbation ([place])   . Status asthmaticus with COPD (chronic obstructive pulmonary disease) ([place]) 07/02/2015  . Second hand tobacco smoke exposure 07/02/2015  . Status asthmaticus, allergic 07/02/2015  . Ingrown toenail 02/06/2014  . Plantar fascial fibromatosis 02/06/2014  . Asthma, chronic 05/10/2012  . Insomnia 05/10/2012    Past Medical History:  Diagnosis Date  . Asthma   . Bronchitis   . Insomnia   . Migraines      Prior to Admission medications   Medication Sig Start Date End Date Taking? Authorizing Provider  acyclovir ointment (ZOVIRAX) 5 % Use every 4-6 hours as needed for cold sores 05/31/15  Yes Derek Boyer, MD  albuterol  (PROVENTIL HFA;VENTOLIN HFA) 108 (90 Base) MCG/ACT inhaler Inhale 2 puffs into the lungs every 6 (six) hours as needed for wheezing or shortness of breath.   Yes Historical Provider, MD  albuterol (PROVENTIL) (2.5 MG/3ML) 0.083% nebulizer solution Take 3 mLs (2.5 mg total) by nebulization every 2 (two) hours as needed for wheezing. 07/03/15  Yes Derek Dike, MD  alprazolam Duanne Moron) 2 MG tablet Take 1 tablet (2 mg total) by mouth at bedtime as needed for sleep. 06/27/15  Yes Derek Haber, MD  budesonide-formoterol [facility]) 160-4.5 MCG/ACT inhaler Inhale 2 puffs into the lungs 2 (two) times daily. 05/14/15  Yes Derek Haber, MD  fluticasone [facility]) 50 MCG/ACT nasal spray Place 2 sprays into both nostrils daily as needed for allergies or rhinitis.   Yes Historical Provider, MD  ipratropium (ATROVENT) 0.02 % nebulizer solution Take 2.5 mLs (0.5 mg total) by nebulization 4 (four) times daily. 11/21/15  Yes Derek Coop, PA-C  Peak Flow Meter (AIRZONE PEAK FLOW METER) DEVI Please use daily to keep track of your breathing. 10/10/15  Yes Derek Coop, PA-C  promethazine (PHENERGAN) 12.5 MG tablet TAKE ONE TABLET BY MOUTH EVERY 8 HOURS AS NEEDED FOR NAUSEA AND VOMITING 11/11/15  Yes Derek Coop, PA-C  SUMAtriptan (IMITREX) 100 MG tablet Take 1/2 or 1 tablet as needed for migraine.  Max 200 mg in 24 hours Patient taking differently: Take 50-100 mg by mouth every 2 (two) hours as needed  for migraine (Take 1/2 or 1 tablet as needed for migraine.  Max 200 mg in 24 hours).  03/21/15  Yes Derek Haber, MD  topiramate (TOPAMAX) 50 MG tablet Take 1 tablet (50 mg total) by mouth at bedtime. Patient taking differently: Take 50 mg by mouth daily as needed (for cluster migraine).  03/21/15  Yes Derek Haber, MD  traZODone (DESYREL) 100 MG tablet Take 1-1.5 tablets (100-150 mg total) by mouth at bedtime. 11/21/15  Yes Derek Coop, PA-C  umeclidinium bromide (INCRUSE ELLIPTA) 62.5 MCG/INH AEPB Inhale  1 puff into the lungs daily. 12/12/15  Yes Derek Males, MD    No Known Allergies  No past surgical history on file.  Family History  Problem Relation Age of Onset  . Asthma Mother     Social History   Social History  . Marital status: Significant Other    Spouse name: N/A  . Number of children: N/A  . Years of education: N/A   Social History Main Topics  . Smoking status: Former Smoker    Packs/day: 1.00    Years: 25.00    Types: Cigarettes  . Smokeless tobacco: Never Used  . Alcohol use No  . Drug use: No  . Sexual activity: Yes    Birth control/ protection: None   Other Topics Concern  . None   Social History Narrative  . None   Review of Systems  No fevers, chills, abd pain, joint pain. See HPI.     Objective:  Physical Exam  Constitutional: He is oriented to person, place, and time. He appears well-developed and well-nourished.  Non-toxic appearance. He does not have a sickly appearance. He does not appear ill. No distress.  HENT:  Right Ear: Tympanic membrane normal.  Left Ear: Tympanic membrane normal.  Nose: Mucosal edema and rhinorrhea present. Right sinus exhibits no maxillary sinus tenderness and no frontal sinus tenderness. Left sinus exhibits no maxillary sinus tenderness and no frontal sinus tenderness.  Mouth/Throat: Uvula is midline, oropharynx is clear and moist and mucous membranes are normal.  Pulmonary/Chest: Effort normal and breath sounds normal. No accessory muscle usage. No tachypnea. No respiratory distress.  Neurological: He is alert and oriented to person, place, and time.   BP (!) 140/92   Pulse 76   Temp 98.1 F (36.7 C) (Oral)   Resp 18   Ht 6' (1.829 m)   Wt 270 lb (122.5 kg)   SpO2 96%   BMI 36.62 kg/m      Assessment & Plan:   Sinus Congestion  Elevated BP  Medication refill  Insomnia - Plan: alprazolam (XANAX) 2 MG tablet  History of asthma - Plan: albuterol (PROVENTIL) (2.5 MG/3ML) 0.083% nebulizer  solution -- presents for xanax refill, last given 6 month supply by [place] 6 months ago, taking nightly for insomnia - refilled 6 month course, encouraged to f/u with new primary provider, Derek Pacheco, [facility] for further refills of this -- Refilled albuterol solution for home neb, no acute flare today -- sinus congestion today with no signs sinusitis - normal vitals, no ttp, no purulent DC, only day 3-4 of sx, encouraged to rtc if symptoms do not improve -- BP slightly elevated today - continue to record weekly at home and bring logs to f/u appts   Julieta Gutting, PA-C Physician Assistant-Certified [facility]  12/27/2015 11:04 AM

## 2015-12-27 NOTE — Patient Instructions (Addendum)
Your xanax has been refilled for a 6 month course. Please be sure to see Philis Fendt, PA-C for further refills of this. Please call ahead to schedule with him to ensure he is here.  I've refilled your albuterol solution. Please continue to record your BP readings weekly. Bring these with you to future appointments.     IF you received an x-ray today, you will receive an invoice from [facility]. Please contact [facility] at [phone] with questions or concerns regarding your invoice.   IF you received labwork today, you will receive an invoice from [employer]. Please contact [facility] at [phone] with questions or concerns regarding your invoice.   Our billing staff will not be able to assist you with questions regarding bills from these companies.  You will be contacted with the lab results as soon as they are available. The fastest way to get your results is to activate your My Chart account. Instructions are located on the last page of this paperwork. If you have not heard from [place] regarding the results in 2 weeks, please contact this office.

## 2015-12-31 ENCOUNTER — Other Ambulatory Visit: Payer: Self-pay | Admitting: Physician Assistant

## 2015-12-31 ENCOUNTER — Ambulatory Visit (INDEPENDENT_AMBULATORY_CARE_PROVIDER_SITE_OTHER): Payer: BLUE CROSS/BLUE SHIELD | Admitting: Physician Assistant

## 2015-12-31 VITALS — BP 154/108 | HR 88 | Temp 97.9°F | Resp 17 | Ht 72.0 in | Wt 266.0 lb

## 2015-12-31 DIAGNOSIS — R030 Elevated blood-pressure reading, without diagnosis of hypertension: Secondary | ICD-10-CM | POA: Diagnosis not present

## 2015-12-31 DIAGNOSIS — R062 Wheezing: Secondary | ICD-10-CM | POA: Diagnosis not present

## 2015-12-31 MED ORDER — ALBUTEROL SULFATE (2.5 MG/3ML) 0.083% IN NEBU
2.5000 mg | INHALATION_SOLUTION | Freq: Once | RESPIRATORY_TRACT | Status: AC
  Administered 2015-12-31: 2.5 mg via RESPIRATORY_TRACT

## 2015-12-31 MED ORDER — DOXYCYCLINE HYCLATE 100 MG PO CAPS: 100.0000 mg | ORAL_CAPSULE | Freq: Two times a day (BID) | ORAL | 0 refills | Status: AC

## 2015-12-31 MED ORDER — IPRATROPIUM BROMIDE 0.02 % IN SOLN
0.5000 mg | RESPIRATORY_TRACT | Status: AC
  Administered 2015-12-31: 0.5 mg via RESPIRATORY_TRACT

## 2015-12-31 MED ORDER — MONTELUKAST SODIUM 10 MG PO TABS: 10.0000 mg | ORAL_TABLET | Freq: Every day | ORAL | 3 refills | Status: DC

## 2015-12-31 MED ORDER — SUVOREXANT 10 MG PO TABS: 10.0000 mg | ORAL_TABLET | Freq: Every day | ORAL | 0 refills | Status: DC

## 2015-12-31 MED ORDER — PREDNISONE 20 MG PO TABS: ORAL_TABLET | ORAL | 0 refills | Status: AC

## 2015-12-31 MED ORDER — ALBUTEROL SULFATE (2.5 MG/3ML) 0.083% IN NEBU
5.0000 mg | INHALATION_SOLUTION | Freq: Once | RESPIRATORY_TRACT | Status: AC
  Administered 2015-12-31: 5 mg via RESPIRATORY_TRACT

## 2015-12-31 MED ORDER — IPRATROPIUM BROMIDE 0.02 % IN SOLN
0.5000 mg | Freq: Once | RESPIRATORY_TRACT | Status: AC
  Administered 2015-12-31: 0.5 mg via RESPIRATORY_TRACT

## 2015-12-31 MED ORDER — SUVOREXANT 15 MG PO TABS: 15.0000 mg | ORAL_TABLET | Freq: Every day | ORAL | 0 refills | Status: DC

## 2015-12-31 MED ORDER — SUVOREXANT 20 MG PO TABS: 20.0000 mg | ORAL_TABLET | Freq: Every day | ORAL | 0 refills | Status: DC

## 2015-12-31 MED ORDER — METHYLPREDNISOLONE SODIUM SUCC 125 MG IJ SOLR
125.0000 mg | Freq: Once | INTRAMUSCULAR | Status: AC
Start: 2015-12-31 — End: 2015-12-31
  Administered 2015-12-31: 125 mg via INTRAMUSCULAR

## 2015-12-31 NOTE — Progress Notes (Addendum)
By signing my name below, I, [name] Small, attest that this documentation has been prepared under the direction and in the presence of Philis Fendt, PA-C.  Electronically Signed: Thea Alken, ED Scribe. 12/31/2015. 3:06 PM.  12/31/2015 3:06 PM   DOB: 1965-10-20 / MRN: [id number]  SUBJECTIVE:  Derek Pacheco is a 50 y.o. male presenting for wheezing that started 5 days ago. I have seen him in the past for similar problems and referred him to pulmonology. Pt reports peak flow readings of 250 or lower.  He reports worsening SOB and wheezing as the day progress. He has been been using Symbicort, albuterol inhaler once a day and albuterol nebulizer twice a day. 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 He denies fever, chills, CP, dizziness, vision changes, HA, sneezing and rhinorrhea.    He has No Known Allergies.   He  has a past medical history of Asthma; Bronchitis; Insomnia; and Migraines.    He  reports that he has quit smoking. His smoking use included Cigarettes. He has a 25.00 pack-year smoking history. He has never used smokeless tobacco. He reports that he does not drink alcohol or use drugs. He  reports that he currently engages in sexual activity. He reports using the following method of birth control/protection: None. The patient  has no past surgical history on file.  His family history includes Asthma in his mother.  Review of Systems  Constitutional: Negative for chills and fever.  Eyes: Negative for blurred vision and double vision.  Respiratory: Positive for cough, shortness of breath and wheezing. Negative for hemoptysis and sputum production.   Cardiovascular: Negative for chest pain and leg swelling.  Neurological: Negative for dizziness and headaches.    The problem list and medications were reviewed and updated by myself where necessary and exist elsewhere in the encounter.   OBJECTIVE:  BP (!) 154/108 (BP  Location: Right Arm, Patient Position: Sitting, Cuff Size: Large)    Pulse 88    Temp 97.9 F (36.6 C) (Oral)    Resp 17    Ht 6' (1.829 m)    Wt 266 lb (120.7 kg)    SpO2 94%    BMI 36.08 kg/m    Physical Exam  Constitutional: He is oriented to person, place, and time. He appears well-developed.  HENT:  Right Ear: Tympanic membrane and ear canal normal.  Left Ear: Tympanic membrane and ear canal normal.  Nose: Nose normal.  Mouth/Throat: Uvula is midline, oropharynx is clear and moist and mucous membranes are normal.  Cardiovascular: Normal rate.   No murmur heard. Pulmonary/Chest: Effort normal. No accessory muscle usage. No respiratory distress. He has wheezes ( diffuse, bilateral, expiratory). He has no rales. He exhibits no tenderness.  Neurological: He is alert and oriented to person, place, and time.  Skin: Skin is warm and dry. He is not diaphoretic.  Psychiatric: He has a normal mood and affect.    ASSESSMENT AND PLAN  Wheezing - Plan: albuterol (PROVENTIL) (2.5 MG/3ML) 0.083% nebulizer solution 2.5 mg, ipratropium (ATROVENT) nebulizer solution 0.5 mg.  I will treat this as COPD flare.  His next follow up is with pullmonologist is in December. I am adding a leukotriene inhibitor to see if this helps him as well.  Advised that he wear his mask at all times when working around dust of any kind.   Elevated blood pressure reading. His BP is always elevated in office I  have a written record of ambulatory pressures that are normal. Will continue to monitor his BP for now.   Meds ordered this encounter  Medications   albuterol (PROVENTIL) (2.5 MG/3ML) 0.083% nebulizer solution 2.5 mg   ipratropium (ATROVENT) nebulizer solution 0.5 mg   methylPREDNISolone sodium succinate (SOLU-MEDROL) 125 mg/2 mL injection 125 mg   albuterol (PROVENTIL) (2.5 MG/3ML) 0.083% nebulizer solution 5 mg   ipratropium (ATROVENT) nebulizer solution 0.5 mg   predniSONE (DELTASONE) 20 MG tablet    Sig:  Take 3 in the morning for 3 days, then 2 in the morning for 3 days, and then 1 in the morning for 3 days.    Dispense:  18 tablet    Refill:  0    Order Specific Question:   Supervising Provider    Answer:   Carlota Raspberry, JEFFREY R [[id number]]   montelukast (SINGULAIR) 10 MG tablet    Sig: Take 1 tablet (10 mg total) by mouth at bedtime.    Dispense:  90 tablet    Refill:  3    Order Specific Question:   Supervising Provider    Answer:   Carlota Raspberry, JEFFREY R [[id number]]   doxycycline (VIBRAMYCIN) 100 MG capsule    Sig: Take 1 capsule (100 mg total) by mouth 2 (two) times daily.    Dispense:  20 capsule    Refill:  0    Order Specific Question:   Supervising Provider    Answer:   Carlota Raspberry, JEFFREY R [[id number]]      The patient is advised to call or return to clinic if he does not see an improvement in symptoms, or to seek the care of the closest emergency department if he worsens with the above plan.   Philis Fendt, MHS, PA-C [facility] 12/31/2015 3:06 PM

## 2015-12-31 NOTE — Patient Instructions (Signed)
     IF you received an x-ray today, you will receive an invoice from [facility]. Please contact [facility] at [phone] with questions or concerns regarding your invoice.   IF you received labwork today, you will receive an invoice from [facility]. Please contact [facility] at [phone] with questions or concerns regarding your invoice.   Our billing staff will not be able to assist you with questions regarding bills from these companies.  You will be contacted with the lab results as soon as they are available. The fastest way to get your results is to activate your My Chart account. Instructions are located on the last page of this paperwork. If you have not heard from us regarding the results in 2 weeks, please contact this office.      

## 2016-01-02 ENCOUNTER — Telehealth: Payer: Self-pay

## 2016-01-02 NOTE — Telephone Encounter (Signed)
PA for Belsomra was approved on covermymeds for Belsomra through 03/28/2038. Pt had tried/failed zolpidem, clonazepam, lorazepam. Currently was using combo of xanax and trazodone in order to be effective, neither effective individually. Pt also believes he tried Lunesta in the past before being a pt of ours, but he was not certain. [employer] called and notified me that it was approved and reported that they also LM for pt. Faxed notice to pharm.

## 2016-01-16 DIAGNOSIS — S83206D Unspecified tear of unspecified meniscus, current injury, right knee, subsequent encounter: Secondary | ICD-10-CM | POA: Diagnosis not present

## 2016-02-07 ENCOUNTER — Telehealth: Payer: Self-pay

## 2016-02-07 NOTE — Telephone Encounter (Signed)
Pt calling wanting a refill on belsomra     Contact [phone]

## 2016-02-11 ENCOUNTER — Telehealth: Payer: Self-pay

## 2016-02-11 NOTE — Telephone Encounter (Signed)
Patient is calling to check the status of his med refill he called in on Saturday 02/07/16 (see telephone message from 02/07/16) and has not heard anything, he states he is now completely out of his Suvorexant (BELSOMRA) 10 MG TAB and he needs them asap. I am marking high priority since it has been 3 days since he called and he now out of these meds  His call back number is [phone]

## 2016-02-11 NOTE — Telephone Encounter (Signed)
Legrand Como, I tried to catch you regarding this patient medication refill.  Please advise

## 2016-02-12 MED ORDER — SUVOREXANT 10 MG PO TABS: 10.0000 mg | ORAL_TABLET | Freq: Every day | ORAL | 5 refills | Status: DC

## 2016-02-12 NOTE — Telephone Encounter (Signed)
Please call in for 10 mg po qhs #[phone] refills.

## 2016-02-12 NOTE — Telephone Encounter (Signed)
Called to [facility] as noted by [facility]

## 2016-02-12 NOTE — Telephone Encounter (Signed)
Duplicate message. Later message sent to Legrand Como for approval since controlled med.

## 2016-02-16 ENCOUNTER — Telehealth: Payer: Self-pay

## 2016-02-16 ENCOUNTER — Other Ambulatory Visit: Payer: Self-pay | Admitting: Family Medicine

## 2016-02-16 DIAGNOSIS — G43109 Migraine with aura, not intractable, without status migrainosus: Secondary | ICD-10-CM

## 2016-02-16 NOTE — Telephone Encounter (Signed)
Clark - Pt says you filled his Belsomra for 10 mg.  He said his last round he ended he was taking 20 mg.  He was just wondering why it was lowered back down. [phone]

## 2016-02-20 MED ORDER — SUVOREXANT 20 MG PO TABS: 20.0000 mg | ORAL_TABLET | Freq: Every day | ORAL | 5 refills | Status: DC

## 2016-02-20 NOTE — Telephone Encounter (Signed)
Legrand Como, it looks like you did orig give pt Rxs for 10, 15, then 20 mg doses of Belsomra, and pt was last on the 20 mg. When pt called, the operator evidently picked the wrong strength to request and no one caught it. Do you want to OK RFs of the 20 mg? I'll pend as you ordered the other one and DCd the 10 and 15 mg off current list.

## 2016-02-20 NOTE — Telephone Encounter (Signed)
Called in 20 mg Rx w/ RFs. Cancelled remaining RFs of 10 mg tabs. Notified pt.

## 2016-02-20 NOTE — Telephone Encounter (Signed)
Lets go to the 20 mg. Philis Fendt, MS, PA-C 4:53 PM, 02/20/2016

## 2016-02-24 ENCOUNTER — Ambulatory Visit (INDEPENDENT_AMBULATORY_CARE_PROVIDER_SITE_OTHER): Payer: BLUE CROSS/BLUE SHIELD | Admitting: Physician Assistant

## 2016-02-24 ENCOUNTER — Encounter: Payer: Self-pay | Admitting: Physician Assistant

## 2016-02-24 ENCOUNTER — Ambulatory Visit (INDEPENDENT_AMBULATORY_CARE_PROVIDER_SITE_OTHER): Payer: BLUE CROSS/BLUE SHIELD

## 2016-02-24 VITALS — BP 130/80 | HR 82 | Temp 98.0°F | Resp 17 | Ht 72.5 in | Wt 276.0 lb

## 2016-02-24 DIAGNOSIS — R059 Cough, unspecified: Secondary | ICD-10-CM

## 2016-02-24 DIAGNOSIS — Z23 Encounter for immunization: Secondary | ICD-10-CM

## 2016-02-24 DIAGNOSIS — R0989 Other specified symptoms and signs involving the circulatory and respiratory systems: Secondary | ICD-10-CM

## 2016-02-24 DIAGNOSIS — R05 Cough: Secondary | ICD-10-CM | POA: Diagnosis not present

## 2016-02-24 MED ORDER — IPRATROPIUM BROMIDE 0.02 % IN SOLN
0.5000 mg | Freq: Once | RESPIRATORY_TRACT | Status: AC
  Administered 2016-02-24: 0.5 mg via RESPIRATORY_TRACT

## 2016-02-24 MED ORDER — ALBUTEROL SULFATE (2.5 MG/3ML) 0.083% IN NEBU
2.5000 mg | INHALATION_SOLUTION | Freq: Once | RESPIRATORY_TRACT | Status: AC
  Administered 2016-02-24: 2.5 mg via RESPIRATORY_TRACT

## 2016-02-24 MED ORDER — PREDNISONE 20 MG PO TABS: ORAL_TABLET | ORAL | 0 refills | Status: AC

## 2016-02-24 MED ORDER — DOXYCYCLINE HYCLATE 100 MG PO CAPS: 100.0000 mg | ORAL_CAPSULE | Freq: Two times a day (BID) | ORAL | 0 refills | Status: AC

## 2016-02-24 NOTE — Patient Instructions (Signed)
     IF you received an x-ray today, you will receive an invoice from [facility]. Please contact [facility] at [phone] with questions or concerns regarding your invoice.   IF you received labwork today, you will receive an invoice from [facility]. Please contact [facility] at [phone] with questions or concerns regarding your invoice.   Our billing staff will not be able to assist you with questions regarding bills from these companies.  You will be contacted with the lab results as soon as they are available. The fastest way to get your results is to activate your My Chart account. Instructions are located on the last page of this paperwork. If you have not heard from us regarding the results in 2 weeks, please contact this office.      

## 2016-02-24 NOTE — Progress Notes (Signed)
02/24/2016 2:21 PM   DOB: 1966-01-24 / MRN: [id number]  SUBJECTIVE:  Derek Pacheco is a 50 y.o. male presenting for cough.  He has a spurious history of breathing difficulty thought to be secondary to COPD and/or asthma.  He has a 25 pack year history of rads show hyperinflation.  He complains of of some mild chest tightness and states his symptoms are roughly a 4-5 out of 10 today.  He monitors his peak flow at home and normal for him is roughly 450-500, and he has been measuring roughly 250.  He has historically responded well to prednisone.  A1c in the system is current.   Immunization History  Administered Date(s) Administered  . Influenza,inj,Quad PF,36+ Mos 12/12/2015  . Pneumococcal Polysaccharide-23 02/24/2016  . Tdap 05/31/2012   He has No Known Allergies.   He  has a past medical history of Asthma; Bronchitis; Insomnia; and Migraines.    He  reports that he has quit smoking. His smoking use included Cigarettes. He has a 25.00 pack-year smoking history. He has never used smokeless tobacco. He reports that he does not drink alcohol or use drugs. He  reports that he currently engages in sexual activity. He reports using the following method of birth control/protection: None. The patient  has no past surgical history on file.  His family history includes Asthma in his mother.  Review of Systems  Constitutional: Positive for malaise/fatigue. Negative for chills, diaphoresis and fever.  HENT: Positive for congestion and sore throat.   Respiratory: Positive for cough and wheezing. Negative for hemoptysis and shortness of breath.   Cardiovascular: Negative for chest pain.  Gastrointestinal: Negative for nausea.  Skin: Negative for rash.  Neurological: Negative for dizziness and weakness.  Endo/Heme/Allergies: Negative for polydipsia.    The problem list and medications were reviewed and updated by myself where necessary and exist elsewhere in the encounter.   OBJECTIVE:  BP  130/80 (BP Location: Right Arm, Patient Position: Sitting, Cuff Size: Normal)   Pulse 82   Temp 98 F (36.7 C) (Oral)   Resp 17   Ht 6' 0.5" (1.842 m)   Wt 276 lb (125.2 kg)   SpO2 96%   BMI 36.92 kg/m   Physical Exam  Cardiovascular: Normal rate, regular rhythm and normal heart sounds.   Pulmonary/Chest: No respiratory distress. He has wheezes (end expiratory, mild, generalized). He has no rales. He exhibits no tenderness.  Musculoskeletal: Normal range of motion.  Skin: Skin is warm and dry.  Vitals reviewed.   No results found for this or any previous visit (from the past 72 hour(s)).  Dg Chest 2 View  Result Date: 02/24/2016 CLINICAL DATA:  Cough with questionable history of asthma and/or COPD. EXAM: CHEST - 2 VIEW COMPARISON:  07/01/2015 FINDINGS: Lungs mildly hyperinflated, clear. Heart size and mediastinal contours are within normal limits. No effusion. Visualized bones unremarkable. IMPRESSION: No acute cardiopulmonary disease. Electronically Signed   By: Lucrezia Europe M.D.   On: 02/24/2016 12:28   Wt Readings from Last 3 Encounters:  02/24/16 276 lb (125.2 kg)  12/31/15 266 lb (120.7 kg)  12/27/15 270 lb (122.5 kg)    OASSESSMENT AND PLAN  Gedeon was seen today for shortness of breath.  Diagnoses and all orders for this visit:  Cough Comments: Will cover for an atypical organism with doxy.  Will start pred.  Orders: -     DG Chest 2 View; Future -     predniSONE (DELTASONE) 20 MG tablet;  Take 3 in the morning for 3 days, then 2 in the morning for 3 days, and then 1 in the morning for 3 days. -     doxycycline (VIBRAMYCIN) 100 MG capsule; Take 1 capsule (100 mg total) by mouth 2 (two) times daily.  Abnormally low peak expiratory flow rate -     albuterol (PROVENTIL) (2.5 MG/3ML) 0.083% nebulizer solution 2.5 mg; Take 3 mLs (2.5 mg total) by nebulization once. -     ipratropium (ATROVENT) nebulizer solution 0.5 mg; Take 2.5 mLs (0.5 mg total) by nebulization  once.  Need for vaccination against Streptococcus pneumoniae -     Pneumococcal polysaccharide vaccine 23-valent greater than or equal to 2yo subcutaneous/IM    The patient is advised to call or return to clinic if he does not see an improvement in symptoms, or to seek the care of the closest emergency department if he worsens with the above plan.   Philis Fendt, MHS, PA-C [facility] 02/24/2016 2:21 PM

## 2016-03-11 DIAGNOSIS — S83231A Complex tear of medial meniscus, current injury, right knee, initial encounter: Secondary | ICD-10-CM | POA: Diagnosis not present

## 2016-03-11 DIAGNOSIS — Y999 Unspecified external cause status: Secondary | ICD-10-CM | POA: Diagnosis not present

## 2016-03-11 DIAGNOSIS — M2241 Chondromalacia patellae, right knee: Secondary | ICD-10-CM | POA: Diagnosis not present

## 2016-03-11 DIAGNOSIS — G8918 Other acute postprocedural pain: Secondary | ICD-10-CM | POA: Diagnosis not present

## 2016-03-16 DIAGNOSIS — Z1283 Encounter for screening for malignant neoplasm of skin: Secondary | ICD-10-CM | POA: Diagnosis not present

## 2016-03-16 DIAGNOSIS — D225 Melanocytic nevi of trunk: Secondary | ICD-10-CM | POA: Diagnosis not present

## 2016-03-17 ENCOUNTER — Encounter: Payer: Self-pay | Admitting: Internal Medicine

## 2016-03-17 ENCOUNTER — Ambulatory Visit (INDEPENDENT_AMBULATORY_CARE_PROVIDER_SITE_OTHER): Payer: BLUE CROSS/BLUE SHIELD | Admitting: Internal Medicine

## 2016-03-17 VITALS — BP 126/82 | HR 73 | Ht 72.5 in | Wt 280.0 lb

## 2016-03-17 DIAGNOSIS — J449 Chronic obstructive pulmonary disease, unspecified: Secondary | ICD-10-CM | POA: Diagnosis not present

## 2016-03-17 DIAGNOSIS — Z87891 Personal history of nicotine dependence: Secondary | ICD-10-CM

## 2016-03-17 NOTE — Progress Notes (Signed)
Subjective:     Patient ID: Derek Pacheco, male   DOB: July 22, 1965, 50 y.o.   MRN: [id number]  HPI   PCP Robyn Haber, MD ' HPI  IOV 10/23/2015  Chief Complaint  Patient presents with  . Advice Only    Referred by Dr. Carlis Abbott for chronic bronchitis.     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 Moved to [facility]. He has a 25 pack smoking history but quit several years ago because of  Obamacare lower premiums and people to quit smoking. 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 Some 6-8 years ago in the fall while walking the dog he developed acute shortness of breath and was hospitalized. At that time he was given a diagnosis of bronchitis. Subsequently was doing well and taking Symbicort as needed till spring 2017. He was then exposed to asbestos dust while at work because of a guidewire mask. Subsequently had exacerbation with shortness of breath, chest tightness and wheezing. It appears at this time he went back on Symbicort on a scheduled basis. He did have an emergency room visit at [facility] was end of March 2017. However his condition worsened and he was admitted to [facility] hospital between 07/01/2015 07/03/2015. Discharge diagnosis was status asthmaticus with COPD. After discharge. Cardiac stress test that was normal and documented below. Over time is continued to get better. However he says maybe a few weeks ago he had another exacerbation which then prompted this referral. Currently he reports baseline albuterol use of one every few days and continued Symbicort usage. He is currently feeling well.   CT chest 08/14/2009: Reported as clear lung fields. I personally visualized this film and agree  Echocardiogram 07/02/2015: Ejection fraction 65%. He has left ventricular hypertrophy and atrial septal thickening and grade 1 diastolic dysfunction   Medicine cardiac stress test  07/07/2015: Low risk stress test  Hemoglobin 10/03/2015: 15.9 g percent   Chest x-ray before 2017: Reported as clear lung fields. I personally visualized the film but suspect there is slight increased interstitial prominence in the bases but it could be vasculature.  [place] [id number]    has a past medical history of Asthma; Bronchitis; Insomnia; and Migraines.   reports that he has quit smoking. His smoking use included Cigarettes. He has a 25.00 pack-year smoking history. He does not have any smokeless tobacco history on file.  No past surgical history on file.  No Known Allergies  OV 12/12/2015  Chief Complaint  Patient presents with  . Follow-up    Pt here after PFT to review results. Pt states he had to get a pred taper from PCP due to breathing in dust, pt feels back to baseline. Pt c/o cough with intermittent mucus production - white in color. Pt denies CP/tightness and f/c/s .    Suspected COPD history. o review pulmonary function test. Pulmonary function test 11/03/2015 shows FEV1 1.6 L/30% with a ratio 49. Post broncho-dilator FEV1 2.2 L/53% with a 37% rapid response and a ratio 55. DLCO 25.1/71%. Features are consistent with COPD/asthma with bronchodilator response. Then on 11/27/2015 had dust exposure at work and had to be treated for an exacerbation of prednisone. Currently feels baseline. 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 He denies asbestos exposure when asked him about it again.   has a past medical history of Asthma; Bronchitis; Insomnia; and Migraines.   reports that he has  quit smoking. His smoking use included Cigarettes. He has a 25.00 pack-year smoking history. He has never used smokeless tobacco.  No past surgical history on file.  No Known Allergies   OV 03/17/2016   Chief Complaint  Patient presents with  . Follow-up    Pt states the Incruse has slightly helped with his breathing. Pt c/o mild cough with little mucus production - clear  in color. Pt denies CP/tightness and f/c/s.    Follow-up Gold stage II COPD  His lung functions consistent with COPD. In the interim he had alpha-1 testing is MM phenotype and normal. His smoking is in remission. He is on triple inhaler therapy and he says this helps him. Although the improvement is only somewhat modest. He still does heavy manual work. Since seeing me last September he's had another exacerbation treated by primary care physician. Again related to concrete dust and needed prednisone. Currently he feels stable. He has some insomnia which his primary care physician is addressing. His last cxr nov  017 shows hyperinflation otherwise clear     Results for ARIANNA, BETHLEY (MRN [id number]) as of 03/17/2016 15:37  Ref. Range 11/07/2015 07:54  FEV1-Post Latest Units: L 2.20  FEV1-%Pred-Post Latest Units: % 53  FEV1-%Change-Post Latest Units: % 37  Results for LEVERE, FAHRER (MRN [id number]) as of 03/17/2016 15:37  Ref. Range 11/07/2015 07:54  DLCO unc Latest Units: ml/min/mmHg 25.10  DLCO unc % pred Latest Units: % 71     has a past medical history of Asthma; Bronchitis; Insomnia; and Migraines.   reports that he quit smoking about 9 years ago. His smoking use included Cigarettes. He has a 25.00 pack-year smoking history. He has never used smokeless tobacco.  No past surgical history on file.  No Known Allergies  Immunization History  Administered Date(s) Administered  . Influenza,inj,Quad PF,36+ Mos 12/12/2015  . Pneumococcal Polysaccharide-23 02/24/2016  . Tdap 05/31/2012    Family History  Problem Relation Age of Onset  . Asthma Mother      Current Outpatient Prescriptions:  .  acyclovir ointment (ZOVIRAX) 5 %, Use every 4-6 hours as needed for cold sores, Disp: 15 g, Rfl: 1 .  albuterol (PROVENTIL HFA;VENTOLIN HFA) 108 (90 Base) MCG/ACT inhaler, Inhale 2 puffs into the lungs every 6 (six) hours as needed for wheezing or shortness of breath., Disp: , Rfl:  .   albuterol (PROVENTIL) (2.5 MG/3ML) 0.083% nebulizer solution, Take 3 mLs (2.5 mg total) by nebulization every 2 (two) hours as needed for wheezing., Disp: 75 mL, Rfl: 12 .  budesonide-formoterol (SYMBICORT) 160-4.5 MCG/ACT inhaler, Inhale 2 puffs into the lungs 2 (two) times daily., Disp: 1 Inhaler, Rfl: 12 .  fluticasone (FLONASE) 50 MCG/ACT nasal spray, Place 2 sprays into both nostrils daily as needed for allergies or rhinitis., Disp: , Rfl:  .  ipratropium (ATROVENT) 0.02 % nebulizer solution, Take 2.5 mLs (0.5 mg total) by nebulization 4 (four) times daily., Disp: 75 mL, Rfl: 12 .  montelukast (SINGULAIR) 10 MG tablet, Take 1 tablet (10 mg total) by mouth at bedtime., Disp: 90 tablet, Rfl: 3 .  Peak Flow Meter (AIRZONE PEAK FLOW METER) DEVI, Please use daily to keep track of your breathing., Disp: 1 each, Rfl: 0 .  promethazine (PHENERGAN) 12.5 MG tablet, TAKE ONE TABLET BY MOUTH EVERY 8 HOURS AS NEEDED FOR NAUSEA AND VOMITING, Disp: 30 tablet, Rfl: 3 .  SUMAtriptan (IMITREX) 100 MG tablet, TAKE ONE-HALF TO ONE TABLET BY MOUTH AS NEEDED FOR  MIGRAINE.  MAX  200  MG  IN  24  HOURS, Disp: 9 tablet, Rfl: 3 .  Suvorexant (BELSOMRA) 20 MG TABS, Take 20 mg by mouth at bedtime., Disp: 30 tablet, Rfl: 5 .  topiramate (TOPAMAX) 50 MG tablet, Take 1 tablet (50 mg total) by mouth at bedtime. (Patient taking differently: Take 50 mg by mouth daily as needed (for cluster migraine). ), Disp: 30 tablet, Rfl: 11 .  traZODone (DESYREL) 100 MG tablet, Take 1-1.5 tablets (100-150 mg total) by mouth at bedtime., Disp: 135 tablet, Rfl: 3 .  umeclidinium bromide (INCRUSE ELLIPTA) 62.5 MCG/INH AEPB, Inhale 1 puff into the lungs daily., Disp: 30 each, Rfl: 11    Review of Systems     Objective:   Physical Exam  Constitutional: He is oriented to person, place, and time. He appears well-developed and well-nourished. No distress.  HENT:  Head: Normocephalic and atraumatic.  Right Ear: External ear normal.  Left  Ear: External ear normal.  Mouth/Throat: Oropharynx is clear and moist. No oropharyngeal exudate.  Eyes: Conjunctivae and EOM are normal. Pupils are equal, round, and reactive to light. Right eye exhibits no discharge. Left eye exhibits no discharge. No scleral icterus.  Neck: Normal range of motion. Neck supple. No JVD present. No tracheal deviation present. No thyromegaly present.  Cardiovascular: Normal rate, regular rhythm and intact distal pulses.  Exam reveals no gallop and no friction rub.   No murmur heard. Pulmonary/Chest: Effort normal and breath sounds normal. No respiratory distress. He has no wheezes. He has no rales. He exhibits no tenderness.  Abdominal: Soft. Bowel sounds are normal. He exhibits no distension and no mass. There is no tenderness. There is no rebound and no guarding.  Musculoskeletal: Normal range of motion. He exhibits no edema or tenderness.  Lymphadenopathy:    He has no cervical adenopathy.  Neurological: He is alert and oriented to person, place, and time. He has normal reflexes. No cranial nerve deficit. Coordination normal.  Skin: Skin is warm and dry. No rash noted. He is not diaphoretic. No erythema. No pallor.  Psychiatric: He has a normal mood and affect. His behavior is normal. Judgment and thought content normal.  Nursing note and vitals reviewed.  Vitals:   03/17/16 1502  BP: 126/82  Pulse: 73  SpO2: 96%  Weight: 280 lb (127 kg)  Height: 6' 0.5" (1.842 m)       Assessment:       ICD-9-CM ICD-10-CM   1. COPD, moderate ([place]) 496 J44.9   2. Stopped smoking with greater than 20 pack year history V15.82 Z87.891        Plan:      - You have moderate COPD  - cxr hyperinflated but clear in nov 2017 and this is c/w COPD - stabld diease 03/17/2016 - normal alpha 1 test - glad you are better from anothjer flare up following work dust exposure  Plan - Continue Symbicort as before - Continue INCRUSE daily - Use albuterol as needed - We  discussed COPD GSK Danriixin study to prevent flare ups  - take a copy of the consent and think about it. It is a voluntary thing - Glad uptodate with flu shot and pneumonia shot - ANy flare up issues in future, please call us - lung cancer screening after age 53  Follow-up 6 months or sooner with COPD cat score at follow-up  -    Dr. Brand Males, M.D., [facility].C.P Pulmonary and Critical Care Medicine Staff Physician  [facility] Pager: [phone], If no answer or between  15:00h - 7:00h: call [phone]  03/17/2016 3:50 PM

## 2016-03-17 NOTE — Patient Instructions (Addendum)
ICD-9-CM ICD-10-CM   1. COPD, moderate ([place]) 496 J44.9   2. Stopped smoking with greater than 20 pack year history V15.82 Z87.891      - You have moderate COPD  - cxr hyperinflated but clear in nov 2017 and this is c/w COPD - stabld diease 03/17/2016 - normal alpha 1 test - glad you are better from anothjer flare up following work dust exposure  Plan - Continue Symbicort as before - Continue INCRUSE daily - Use albuterol as needed - We discussed COPD [employer] Danriixin study to prevent flare ups  - take a copy of the consent and think about it. It is a voluntary thing - Glad uptodate with flu shot and pneumonia shot - ANy flare up issues in future, please call us - lung cancer screening after age 23  Follow-up 6 months or sooner with COPD cat score at follow-up  -

## 2016-03-18 ENCOUNTER — Other Ambulatory Visit: Payer: Self-pay | Admitting: Family Medicine

## 2016-03-18 DIAGNOSIS — G43109 Migraine with aura, not intractable, without status migrainosus: Secondary | ICD-10-CM

## 2016-03-18 DIAGNOSIS — J3089 Other allergic rhinitis: Secondary | ICD-10-CM

## 2016-03-18 DIAGNOSIS — Z8619 Personal history of other infectious and parasitic diseases: Secondary | ICD-10-CM

## 2016-03-19 DIAGNOSIS — S83206D Unspecified tear of unspecified meniscus, current injury, right knee, subsequent encounter: Secondary | ICD-10-CM | POA: Diagnosis not present

## 2016-03-19 DIAGNOSIS — M25661 Stiffness of right knee, not elsewhere classified: Secondary | ICD-10-CM | POA: Diagnosis not present

## 2016-03-19 DIAGNOSIS — M25561 Pain in right knee: Secondary | ICD-10-CM | POA: Diagnosis not present

## 2016-03-23 DIAGNOSIS — M25561 Pain in right knee: Secondary | ICD-10-CM | POA: Diagnosis not present

## 2016-03-23 DIAGNOSIS — M25661 Stiffness of right knee, not elsewhere classified: Secondary | ICD-10-CM | POA: Diagnosis not present

## 2016-03-26 DIAGNOSIS — M25661 Stiffness of right knee, not elsewhere classified: Secondary | ICD-10-CM | POA: Diagnosis not present

## 2016-03-26 DIAGNOSIS — M25561 Pain in right knee: Secondary | ICD-10-CM | POA: Diagnosis not present

## 2016-03-31 DIAGNOSIS — M25661 Stiffness of right knee, not elsewhere classified: Secondary | ICD-10-CM | POA: Diagnosis not present

## 2016-03-31 DIAGNOSIS — M25561 Pain in right knee: Secondary | ICD-10-CM | POA: Diagnosis not present

## 2016-04-01 DIAGNOSIS — M25561 Pain in right knee: Secondary | ICD-10-CM | POA: Diagnosis not present

## 2016-04-01 DIAGNOSIS — M25661 Stiffness of right knee, not elsewhere classified: Secondary | ICD-10-CM | POA: Diagnosis not present

## 2016-04-05 DIAGNOSIS — M25561 Pain in right knee: Secondary | ICD-10-CM | POA: Diagnosis not present

## 2016-04-05 DIAGNOSIS — M25661 Stiffness of right knee, not elsewhere classified: Secondary | ICD-10-CM | POA: Diagnosis not present

## 2016-04-09 ENCOUNTER — Ambulatory Visit (INDEPENDENT_AMBULATORY_CARE_PROVIDER_SITE_OTHER): Payer: BLUE CROSS/BLUE SHIELD | Admitting: Physician Assistant

## 2016-04-09 ENCOUNTER — Ambulatory Visit (INDEPENDENT_AMBULATORY_CARE_PROVIDER_SITE_OTHER): Payer: BLUE CROSS/BLUE SHIELD

## 2016-04-09 VITALS — BP 124/70 | HR 109 | Temp 100.2°F | Resp 18 | Ht 72.5 in | Wt 275.0 lb

## 2016-04-09 DIAGNOSIS — J09X2 Influenza due to identified novel influenza A virus with other respiratory manifestations: Secondary | ICD-10-CM | POA: Diagnosis not present

## 2016-04-09 DIAGNOSIS — R062 Wheezing: Secondary | ICD-10-CM

## 2016-04-09 DIAGNOSIS — R000 Tachycardia, unspecified: Secondary | ICD-10-CM | POA: Diagnosis not present

## 2016-04-09 DIAGNOSIS — R110 Nausea: Secondary | ICD-10-CM

## 2016-04-09 DIAGNOSIS — J111 Influenza due to unidentified influenza virus with other respiratory manifestations: Secondary | ICD-10-CM | POA: Diagnosis not present

## 2016-04-09 LAB — POCT INFLUENZA A/B
Influenza A, POC: POSITIVE — AB
Influenza B, POC: NEGATIVE

## 2016-04-09 MED ORDER — METHYLPREDNISOLONE SODIUM SUCC 125 MG IJ SOLR
125.0000 mg | Freq: Once | INTRAMUSCULAR | Status: AC
  Administered 2016-04-09: 125 mg via INTRAVENOUS

## 2016-04-09 MED ORDER — OSELTAMIVIR PHOSPHATE 75 MG PO CAPS: 75.0000 mg | ORAL_CAPSULE | Freq: Two times a day (BID) | ORAL | 0 refills | Status: DC

## 2016-04-09 MED ORDER — PROMETHAZINE HCL 25 MG/ML IJ SOLN
25.0000 mg | INTRAMUSCULAR | Status: AC
  Administered 2016-04-09: 25 mg via INTRAMUSCULAR

## 2016-04-09 MED ORDER — ALBUTEROL SULFATE (2.5 MG/3ML) 0.083% IN NEBU
2.5000 mg | INHALATION_SOLUTION | Freq: Once | RESPIRATORY_TRACT | Status: AC
  Administered 2016-04-09: 2.5 mg via RESPIRATORY_TRACT

## 2016-04-09 MED ORDER — ONDANSETRON HCL 4 MG PO TABS: 4.0000 mg | ORAL_TABLET | Freq: Three times a day (TID) | ORAL | 0 refills | Status: DC | PRN

## 2016-04-09 MED ORDER — PREDNISONE 20 MG PO TABS: ORAL_TABLET | ORAL | 0 refills | Status: AC

## 2016-04-09 MED ORDER — IPRATROPIUM BROMIDE 0.02 % IN SOLN
0.5000 mg | Freq: Once | RESPIRATORY_TRACT | Status: AC
  Administered 2016-04-09: 0.5 mg via RESPIRATORY_TRACT

## 2016-04-09 MED ORDER — ONDANSETRON 4 MG PO TBDP
8.0000 mg | ORAL_TABLET | Freq: Once | ORAL | Status: AC
  Administered 2016-04-09: 8 mg via ORAL

## 2016-04-09 NOTE — Progress Notes (Signed)
04/09/2016 3:16 PM   DOB: January 21, 1966 / MRN: [id number]  SUBJECTIVE:  Derek Pacheco is a 51 y.o. male presenting for cough, myalgia, nausea that started 3 days ago.  He has a history of chronic bronchitis.  He had the flu shot. No history of diabetes.   He has No Known Allergies.   He  has a past medical history of Asthma; Bronchitis; Insomnia; and Migraines.    He  reports that he quit smoking about 10 years ago. His smoking use included Cigarettes. He has a 25.00 pack-year smoking history. He has never used smokeless tobacco. He reports that he does not drink alcohol or use drugs. He  reports that he currently engages in sexual activity. He reports using the following method of birth control/protection: None. The patient  has no past surgical history on file.  His family history includes Asthma in his mother.  Review of Systems  Constitutional: Positive for chills and fever. Negative for malaise/fatigue and weight loss.  HENT: Positive for congestion.   Respiratory: Positive for cough, shortness of breath and wheezing.   Cardiovascular: Negative for chest pain and leg swelling.  Gastrointestinal: Negative for nausea.  Skin: Negative for itching and rash.  Neurological: Negative for dizziness.    The problem list and medications were reviewed and updated by myself where necessary and exist elsewhere in the encounter.   OBJECTIVE:  BP 124/70 (BP Location: Right Arm, Patient Position: Sitting, Cuff Size: Large)   Pulse (!) 109   Temp 100.2 F (37.9 C) (Oral)   Resp 18   Ht 6' 0.5" (1.842 m)   Wt 275 lb (124.7 kg)   SpO2 95%   PF 210 L/min   BMI 36.78 kg/m   Physical Exam  Constitutional: He is oriented to person, place, and time. He appears well-developed and well-nourished.  Cardiovascular: Normal rate and regular rhythm.   Pulmonary/Chest: Effort normal. No respiratory distress. He has wheezes. He has no rales. He exhibits no tenderness.  Musculoskeletal: Normal range of  motion.  Neurological: He is alert and oriented to person, place, and time. No cranial nerve deficit.   Lab Results  Component Value Date   ALT 37 06/27/2015   AST 34 06/27/2015   ALKPHOS 86 06/27/2015   BILITOT 1.4 (H) 06/27/2015     Lab Results  Component Value Date   HGBA1C 5.7 05/31/2015   Results for orders placed or performed in visit on 04/09/16 (from the past 72 hour(s))  POCT Influenza A/B     Status: Abnormal   Collection Time: 04/09/16  2:07 PM  Result Value Ref Range   Influenza A, POC Positive (A) Negative   Influenza B, POC Negative Negative   Orthostatic VS for the past 24 hrs:  BP- Lying Pulse- Lying BP- Sitting Pulse- Sitting BP- Standing at 0 minutes Pulse- Standing at 0 minutes  04/09/16 1510 (!) 161/101 116 (!) 154/103 123 (!) 134/93 128      Dg Chest 2 View  Result Date: 04/09/2016 CLINICAL DATA:  Patient diagnosed with influenza. EXAM: CHEST  2 VIEW COMPARISON:  PA and lateral chest 02/24/2016. FINDINGS: Lungs are clear. Heart size is normal. No pneumothorax or pleural effusion. No acute bony abnormality. IMPRESSION: No acute disease. Electronically Signed   By: Inge Rise M.D.   On: 04/09/2016 14:41    ASSESSMENT AND PLAN:  Derek Pacheco was seen today for follow-up, fever and generalized body aches.  Diagnoses and all orders for this visit:  Influenza due to  identified novel influenza A virus with other respiratory manifestations: Strict ED and RTC precautions given. See AVS.  We are a little late getting to tamiflu however his history of breathing necessitates this.  He was nauseas today so I have prescribed zofran to take along with phenergen which he already has. He 125 solumedrol here and starting a taper tomorrow.  Low threshold for doxy due to his history of smoking.  -     oseltamivir (TAMIFLU) 75 MG capsule; Take 1 capsule (75 mg total) by mouth 2 (two) times daily.  Wheezing -     POCT Influenza A/B -     albuterol (PROVENTIL) (2.5  MG/3ML) 0.083% nebulizer solution 2.5 mg; Take 3 mLs (2.5 mg total) by nebulization once. -     ipratropium (ATROVENT) nebulizer solution 0.5 mg; Take 2.5 mLs (0.5 mg total) by nebulization once. -     methylPREDNISolone sodium succinate (SOLU-MEDROL) 125 mg/2 mL injection 125 mg; Inject 2 mLs (125 mg total) into the vein once. -     predniSONE (DELTASONE) 20 MG tablet; Start on 04/11/15.  Take 3 in the morning for 3 days, then 2 in the morning for 3 days, and then 1 in the morning for 3 days. -     DG Chest 2 View; Future -     albuterol (PROVENTIL) (2.5 MG/3ML) 0.083% nebulizer solution 2.5 mg; Take 3 mLs (2.5 mg total) by nebulization once.  Tachycardia -     Orthostatic vital signs  Nausea without vomiting -     ondansetron (ZOFRAN-ODT) disintegrating tablet 8 mg; Take 2 tablets (8 mg total) by mouth once. -     promethazine (PHENERGAN) injection 25 mg; Inject 1 mL (25 mg total) into the muscle now.    The patient is advised to call or return to clinic if he does not see an improvement in symptoms, or to seek the care of the closest emergency department if he worsens with the above plan.   Philis Fendt, MHS, PA-C [facility] 04/09/2016 3:16 PM

## 2016-04-09 NOTE — Patient Instructions (Addendum)
Take your tamiflu ASAP and another dose tonight.  Start your prednisone tomorrow.  Use your breathing treatments every 4 hours as needed. Take your phenergen for nausea as prescribed.  Take 1000 mg of tylenol every 6 hours for fever if needed. PLEASE DRINK COPIOUS FLUIDS. If you are worsening tonight then go to the ED.  If you are worsening tomorrow then come here to see Dr. Nolon Rod.     IF you received an x-ray today, you will receive an invoice from [facility]. Please contact [facility] at [phone] with questions or concerns regarding your invoice.   IF you received labwork today, you will receive an invoice from [place]. Please contact [facility] at [phone] with questions or concerns regarding your invoice.   Our billing staff will not be able to assist you with questions regarding bills from these companies.  You will be contacted with the lab results as soon as they are available. The fastest way to get your results is to activate your My Chart account. Instructions are located on the last page of this paperwork. If you have not heard from [place] regarding the results in 2 weeks, please contact this office.

## 2016-05-05 DIAGNOSIS — M25569 Pain in unspecified knee: Secondary | ICD-10-CM | POA: Diagnosis not present

## 2016-06-09 DIAGNOSIS — M25561 Pain in right knee: Secondary | ICD-10-CM | POA: Diagnosis not present

## 2016-09-08 ENCOUNTER — Other Ambulatory Visit: Payer: Self-pay | Admitting: Physician Assistant

## 2016-09-09 ENCOUNTER — Ambulatory Visit (INDEPENDENT_AMBULATORY_CARE_PROVIDER_SITE_OTHER): Payer: BLUE CROSS/BLUE SHIELD | Admitting: Physician Assistant

## 2016-09-09 ENCOUNTER — Encounter: Payer: Self-pay | Admitting: Physician Assistant

## 2016-09-09 ENCOUNTER — Ambulatory Visit (INDEPENDENT_AMBULATORY_CARE_PROVIDER_SITE_OTHER): Payer: BLUE CROSS/BLUE SHIELD

## 2016-09-09 VITALS — BP 133/83 | HR 92 | Resp 16 | Ht 72.05 in | Wt 277.2 lb

## 2016-09-09 DIAGNOSIS — R1011 Right upper quadrant pain: Secondary | ICD-10-CM | POA: Diagnosis not present

## 2016-09-09 DIAGNOSIS — Z1211 Encounter for screening for malignant neoplasm of colon: Secondary | ICD-10-CM

## 2016-09-09 DIAGNOSIS — G4700 Insomnia, unspecified: Secondary | ICD-10-CM

## 2016-09-09 DIAGNOSIS — J449 Chronic obstructive pulmonary disease, unspecified: Secondary | ICD-10-CM | POA: Diagnosis not present

## 2016-09-09 DIAGNOSIS — R1084 Generalized abdominal pain: Secondary | ICD-10-CM

## 2016-09-09 LAB — POCT CBC
Granulocyte percent: 68.3 %G (ref 37–80)
HCT, POC: 44.1 % (ref 43.5–53.7)
Hemoglobin: 15.3 g/dL (ref 14.1–18.1)
Lymph, poc: 2.1 (ref 0.6–3.4)
MCH, POC: 30.4 pg (ref 27–31.2)
MCHC: 34.7 g/dL (ref 31.8–35.4)
MCV: 87.6 fL (ref 80–97)
MID (cbc): 0.1 (ref 0–0.9)
MPV: 9.3 fL (ref 0–99.8)
POC Granulocyte: 4.8 (ref 2–6.9)
POC LYMPH PERCENT: 29.8 %L (ref 10–50)
POC MID %: 1.9 %M (ref 0–12)
Platelet Count, POC: 201 10*3/uL (ref 142–424)
RBC: 5.03 M/uL (ref 4.69–6.13)
RDW, POC: 12.9 %
WBC: 7.1 10*3/uL (ref 4.6–10.2)

## 2016-09-09 LAB — POCT URINALYSIS DIP (MANUAL ENTRY)
BILIRUBIN UA: NEGATIVE mg/dL
Bilirubin, UA: NEGATIVE
Glucose, UA: NEGATIVE mg/dL
Leukocytes, UA: NEGATIVE
Nitrite, UA: NEGATIVE
PH UA: 5.5 (ref 5.0–8.0)
Protein Ur, POC: NEGATIVE mg/dL
RBC UA: NEGATIVE
UROBILINOGEN UA: 0.2 U/dL

## 2016-09-09 MED ORDER — PANTOPRAZOLE SODIUM 40 MG PO TBEC: 40.0000 mg | DELAYED_RELEASE_TABLET | Freq: Every day | ORAL | 3 refills | Status: DC

## 2016-09-09 MED ORDER — ALBUTEROL SULFATE (2.5 MG/3ML) 0.083% IN NEBU: 2.5000 mg | INHALATION_SOLUTION | Freq: Four times a day (QID) | RESPIRATORY_TRACT | 12 refills | Status: DC | PRN

## 2016-09-09 MED ORDER — RANITIDINE HCL 150 MG PO TABS: 150.0000 mg | ORAL_TABLET | Freq: Every evening | ORAL | 0 refills | Status: DC | PRN

## 2016-09-09 MED ORDER — IPRATROPIUM BROMIDE 0.02 % IN SOLN: 0.5000 mg | Freq: Four times a day (QID) | RESPIRATORY_TRACT | 12 refills | Status: DC

## 2016-09-09 MED ORDER — BUDESONIDE-FORMOTEROL FUMARATE 160-4.5 MCG/ACT IN AERO: 2.0000 | INHALATION_SPRAY | Freq: Two times a day (BID) | RESPIRATORY_TRACT | 12 refills | Status: DC

## 2016-09-09 MED ORDER — ZALEPLON 5 MG PO CAPS: 5.0000 mg | ORAL_CAPSULE | Freq: Every evening | ORAL | 5 refills | Status: DC | PRN

## 2016-09-09 MED ORDER — PROMETHAZINE HCL 12.5 MG PO TABS: 12.5000 mg | ORAL_TABLET | Freq: Three times a day (TID) | ORAL | 3 refills | Status: DC | PRN

## 2016-09-09 NOTE — Progress Notes (Signed)
09/11/2016 11:36 AM   DOB: Sep 03, 1965 / MRN: [id number]  SUBJECTIVE:  Derek Pacheco is a 51 y.o. male presenting for abdominal pain about the lower abdomen.  Tells me that his appetite is also reduced due "my stomach rolling" and eating or drinking seems to help this. Sometimes throwing up makes the pain better as well.  He can not tolerate cold water and tells me this will come right back up.   Associates GERD with this as well.  Tells me he has a long history of GERD but over the last 2-3 weeks this has also gotten worse.  Tells me he has get something to drink to get his food to go down. He still has his gallbladder and appendix.  He tells me he is defecating well for him and he denies melena.Denies fever.   Has a long history of COPD thought to be secondary to COPD. He would also like refills of his nebulizer therapy however.   He has a long history of insomnia.  He has tried multiple sleep prescriptions and the only thing that has worked for him is Xanax 2 mg.  He has also tried Trazodone and this did not help him. Says that he has always been this way.   He has No Known Allergies.   He  has a past medical history of Asthma; Bronchitis; Insomnia; and Migraines.    He  reports that he quit smoking about 10 years ago. His smoking use included Cigarettes. He has a 25.00 pack-year smoking history. He has never used smokeless tobacco. He reports that he does not drink alcohol or use drugs. He  reports that he currently engages in sexual activity. He reports using the following method of birth control/protection: None. The patient  has no past surgical history on file.  His family history includes Asthma in his mother.  ROS  The problem list and medications were reviewed and updated by myself where necessary and exist elsewhere in the encounter.   OBJECTIVE:  BP 133/83 (BP Location: Right Arm, Patient Position: Sitting, Cuff Size: Large)   Pulse 92   Resp 16   Ht 6' 0.05" (1.83 m)    Wt 277 lb 3.2 oz (125.7 kg)   SpO2 94%   BMI 37.54 kg/m   Physical Exam  BP Readings from Last 3 Encounters:  09/09/16 133/83  04/09/16 124/70  03/17/16 126/82   Pulse Readings from Last 3 Encounters:  09/09/16 92  04/09/16 (!) 109  03/17/16 73   Lab Results  Component Value Date   WBC 7.1 09/09/2016   HGB 15.3 09/09/2016   HCT 44.1 09/09/2016   MCV 87.6 09/09/2016   PLT 204 07/02/2015    Lab Results  Component Value Date   NA 143 09/09/2016   K 4.2 09/09/2016   CL 105 09/09/2016   CO2 25 09/09/2016    Lab Results  Component Value Date   CREATININE 1.09 09/09/2016    Lab Results  Component Value Date   ALT 50 (H) 09/09/2016   AST 39 09/09/2016   ALKPHOS 77 09/09/2016   BILITOT 0.8 09/09/2016    Lab Results  Component Value Date   TSH 1.903 Test methodology is 3rd generation TSH 05/26/2009    Lab Results  Component Value Date   HGBA1C 5.7 05/31/2015    No results found for: CHOL, HDL, LDLCALC, LDLDIRECT, TRIG, CHOLHDL    Results for orders placed or performed in visit on 09/09/16 (from the past  72 hour(s))  CMP14+EGFR     Status: Abnormal   Collection Time: 09/09/16  6:16 PM  Result Value Ref Range   Glucose 100 (H) 65 - 99 mg/dL   BUN 17 6 - 24 mg/dL   Creatinine, Ser 1.09 0.76 - 1.27 mg/dL   GFR calc non Af Amer 78 >59 mL/min/1.73   GFR calc Af Amer 90 >59 mL/min/1.73   BUN/Creatinine Ratio 16 9 - 20   Sodium 143 134 - 144 mmol/L   Potassium 4.2 3.5 - 5.2 mmol/L   Chloride 105 96 - 106 mmol/L   CO2 25 20 - 29 mmol/L    Comment:               **Please note reference interval change**   Calcium 9.6 8.7 - 10.2 mg/dL   Total Protein 7.3 6.0 - 8.5 g/dL   Albumin 4.9 3.5 - 5.5 g/dL   Globulin, Total 2.4 1.5 - 4.5 g/dL   Albumin/Globulin Ratio 2.0 1.2 - 2.2   Bilirubin Total 0.8 0.0 - 1.2 mg/dL   Alkaline Phosphatase 77 39 - 117 IU/L   AST 39 0 - 40 IU/L   ALT 50 (H) 0 - 44 IU/L  H. pylori breath test     Status: None   Collection Time:  09/09/16  6:16 PM  Result Value Ref Range   H pylori Breath Test Negative Negative  Lipase     Status: None   Collection Time: 09/09/16  6:16 PM  Result Value Ref Range   Lipase 17 13 - 78 U/L  POCT urinalysis dipstick     Status: Abnormal   Collection Time: 09/09/16  6:28 PM  Result Value Ref Range   Color, UA yellow yellow   Clarity, UA clear clear   Glucose, UA negative negative mg/dL   Bilirubin, UA negative negative   Ketones, POC UA negative negative mg/dL   Spec Grav, UA >=1.030 (A) 1.010 - 1.025   Blood, UA negative negative   pH, UA 5.5 5.0 - 8.0   Protein Ur, POC negative negative mg/dL   Urobilinogen, UA 0.2 0.2 or 1.0 E.U./dL   Nitrite, UA Negative Negative   Leukocytes, UA Negative Negative  POCT CBC     Status: None   Collection Time: 09/09/16  6:42 PM  Result Value Ref Range   WBC 7.1 4.6 - 10.2 K/uL   Lymph, poc 2.1 0.6 - 3.4   POC LYMPH PERCENT 29.8 10 - 50 %L   MID (cbc) 0.1 0 - 0.9   POC MID % 1.9 0 - 12 %M   POC Granulocyte 4.8 2 - 6.9   Granulocyte percent 68.3 37 - 80 %G   RBC 5.03 4.69 - 6.13 M/uL   Hemoglobin 15.3 14.1 - 18.1 g/dL   HCT, POC 44.1 43.5 - 53.7 %   MCV 87.6 80 - 97 fL   MCH, POC 30.4 27 - 31.2 pg   MCHC 34.7 31.8 - 35.4 g/dL   RDW, POC 12.9 %   Platelet Count, POC 201 142 - 424 K/uL   MPV 9.3 0 - 99.8 fL    No results found.  ASSESSMENT AND PLAN:  Derek Pacheco was seen today for stomach concerns.  Diagnoses and all orders for this visit:  Generalized abdominal pain: Will treat GERD.  If he is not improving in the next week or two will advise that he proceed with GI referral given that he is negative for H. Pylori. Workup is otherwise  reassuring.   -     promethazine (PHENERGAN) 12.5 MG tablet; Take 1-2 tablets (12.5-25 mg total) by mouth every 8 (eight) hours as needed for nausea or vomiting. -     DG Abd 2 Views; Future -     POCT CBC -     CMP14+EGFR -     H. pylori breath test -     Lipase -     POCT urinalysis dipstick -      pantoprazole (PROTONIX) 40 MG tablet; Take 1 tablet (40 mg total) by mouth daily. Always take on an empty stomach in the morning and eat something 30 minutes after taking. -     ranitidine (ZANTAC) 150 MG tablet; Take 1 tablet (150 mg total) by mouth at bedtime as needed for heartburn.  Insomnia, unspecified type: Failed mirtzipine, lunesta, ambien.  Will try sonata.  The only thing that has worked is 2 mg xanax and this is clearly not safe.  Next step if sonata fails is to have him see Dr. Asencion Partridge D in sleep for further investigation into his insomnia.  -     zaleplon (SONATA) 5 MG capsule; Take 1-2 capsules (5-10 mg total) by mouth at bedtime as needed for sleep.  Stage 2 moderate COPD by GOLD classification (HCC) -     budesonide-formoterol (SYMBICORT) 160-4.5 MCG/ACT inhaler; Inhale 2 puffs into the lungs 2 (two) times daily. -     ipratropium (ATROVENT) 0.02 % nebulizer solution; Take 2.5 mLs (0.5 mg total) by nebulization 4 (four) times daily. -     albuterol (PROVENTIL) (2.5 MG/3ML) 0.083% nebulizer solution; Take 3 mLs (2.5 mg total) by nebulization every 6 (six) hours as needed for wheezing. Please place on filer for patient.  Screening for colon cancer -     Cologuard    The patient is advised to call or return to clinic if he does not see an improvement in symptoms, or to seek the care of the closest emergency department if he worsens with the above plan.   Philis Fendt, MHS, PA-C [facility] 09/11/2016 11:36 AM

## 2016-09-09 NOTE — Patient Instructions (Signed)
     IF you received an x-ray today, you will receive an invoice from [facility]. Please contact [facility] at [phone] with questions or concerns regarding your invoice.   IF you received labwork today, you will receive an invoice from [facility]. Please contact [facility] at [phone] with questions or concerns regarding your invoice.   Our billing staff will not be able to assist you with questions regarding bills from these companies.  You will be contacted with the lab results as soon as they are available. The fastest way to get your results is to activate your My Chart account. Instructions are located on the last page of this paperwork. If you have not heard from us regarding the results in 2 weeks, please contact this office.     

## 2016-09-10 LAB — CMP14+EGFR
A/G RATIO: 2 (ref 1.2–2.2)
ALK PHOS: 77 IU/L (ref 39–117)
ALT: 50 IU/L — AB (ref 0–44)
AST: 39 IU/L (ref 0–40)
Albumin: 4.9 g/dL (ref 3.5–5.5)
BILIRUBIN TOTAL: 0.8 mg/dL (ref 0.0–1.2)
BUN/Creatinine Ratio: 16 (ref 9–20)
BUN: 17 mg/dL (ref 6–24)
CALCIUM: 9.6 mg/dL (ref 8.7–10.2)
CHLORIDE: 105 mmol/L (ref 96–106)
CO2: 25 mmol/L (ref 20–29)
Creatinine, Ser: 1.09 mg/dL (ref 0.76–1.27)
GFR calc Af Amer: 90 mL/min/{1.73_m2} (ref >59)
GFR calc non Af Amer: 78 mL/min/{1.73_m2} (ref >59)
Globulin, Total: 2.4 g/dL (ref 1.5–4.5)
Glucose: 100 mg/dL — ABNORMAL HIGH (ref 65–99)
POTASSIUM: 4.2 mmol/L (ref 3.5–5.2)
SODIUM: 143 mmol/L (ref 134–144)
Total Protein: 7.3 g/dL (ref 6.0–8.5)

## 2016-09-10 LAB — H. PYLORI BREATH TEST: H PYLORI BREATH TEST: NEGATIVE

## 2016-09-10 LAB — LIPASE: LIPASE: 17 U/L (ref 13–78)

## 2016-09-13 ENCOUNTER — Encounter: Payer: Self-pay | Admitting: Physician Assistant

## 2016-09-13 DIAGNOSIS — R7989 Other specified abnormal findings of blood chemistry: Secondary | ICD-10-CM | POA: Insufficient documentation

## 2016-09-13 DIAGNOSIS — R945 Abnormal results of liver function studies: Secondary | ICD-10-CM

## 2016-10-19 ENCOUNTER — Ambulatory Visit (INDEPENDENT_AMBULATORY_CARE_PROVIDER_SITE_OTHER): Payer: BLUE CROSS/BLUE SHIELD | Admitting: Physician Assistant

## 2016-10-19 ENCOUNTER — Encounter: Payer: Self-pay | Admitting: Physician Assistant

## 2016-10-19 VITALS — BP 144/100 | HR 87 | Temp 98.1°F | Resp 20 | Ht 72.5 in | Wt 279.4 lb

## 2016-10-19 DIAGNOSIS — K219 Gastro-esophageal reflux disease without esophagitis: Secondary | ICD-10-CM | POA: Diagnosis not present

## 2016-10-19 DIAGNOSIS — J441 Chronic obstructive pulmonary disease with (acute) exacerbation: Secondary | ICD-10-CM

## 2016-10-19 DIAGNOSIS — J449 Chronic obstructive pulmonary disease, unspecified: Secondary | ICD-10-CM | POA: Diagnosis not present

## 2016-10-19 MED ORDER — PANTOPRAZOLE SODIUM 40 MG PO TBEC: 40.0000 mg | DELAYED_RELEASE_TABLET | Freq: Every day | ORAL | 3 refills | Status: DC

## 2016-10-19 MED ORDER — PREDNISONE 20 MG PO TABS
ORAL_TABLET | ORAL | 0 refills | Status: AC
Start: 2016-10-19 — End: 2016-10-28

## 2016-10-19 MED ORDER — ALBUTEROL SULFATE (2.5 MG/3ML) 0.083% IN NEBU: 2.5000 mg | INHALATION_SOLUTION | Freq: Four times a day (QID) | RESPIRATORY_TRACT | 12 refills | Status: DC | PRN

## 2016-10-19 MED ORDER — AZITHROMYCIN 250 MG PO TABS: ORAL_TABLET | ORAL | 0 refills | Status: DC

## 2016-10-19 MED ORDER — METHYLPREDNISOLONE SODIUM SUCC 125 MG IJ SOLR
125.0000 mg | Freq: Once | INTRAMUSCULAR | Status: AC
  Administered 2016-10-19: 125 mg via INTRAMUSCULAR

## 2016-10-19 NOTE — Patient Instructions (Addendum)
Start the azithromycin today.  Start the prednisone by mouth tomorrow morning.  I have refilled you albuterol nebs.  I have refilled you pantoprazole (heartburn medicine.)  It is okay to try and stop the pantoprazole after you finish the next thirty day prescription, however if you heart burn starts to return I suggest you start back on the medication and plan to stay on it.   You weight is creeping up.  Please check your weight daily so you will be cognizant of any changes and can make diet changes as needed.     IF you received an x-ray today, you will receive an invoice from [facility]. Please contact [facility] at [phone] with questions or concerns regarding your invoice.   IF you received labwork today, you will receive an invoice from [place]. Please contact [facility] at [phone] with questions or concerns regarding your invoice.   Our billing staff will not be able to assist you with questions regarding bills from these companies.  You will be contacted with the lab results as soon as they are available. The fastest way to get your results is to activate your My Chart account. Instructions are located on the last page of this paperwork. If you have not heard from [place] regarding the results in 2 weeks, please contact this office.

## 2016-10-19 NOTE — Progress Notes (Signed)
10/21/2016 4:20 PM   DOB: 1965/08/28 / MRN: [id number]  SUBJECTIVE:  Derek Pacheco is a 51 y.o. male presenting for cough and wheezing.  Has a long history of breathing difficulty found to be secondary to COPD via pulmonology.  Is complaint with current medication regimen and has been having far fewer exacerbations since medications were adjusted.  He checks his peak flow often and tells me that he is not in the green and is almost in the red.  Knows that if he does not come in now that he will eventually have severe symptoms.  Associates SOB.  Denies fever.  Denies leg swelling.  No chest pain.  Feels he is getting worse.   I have seen him for symptoms suspicious for GERD and started him on Pantoprazole and he has had a complete remission of symptoms.   He checks his pressure at home and notes that it has been elevated in the last few days since his breathing symptoms have gotten worse.   He has No Known Allergies.   He  has a past medical history of Asthma; Bronchitis; Insomnia; and Migraines.    He  reports that he quit smoking about 10 years ago. His smoking use included Cigarettes. He has a 25.00 pack-year smoking history. He has never used smokeless tobacco. He reports that he does not drink alcohol or use drugs. He  reports that he currently engages in sexual activity. He reports using the following method of birth control/protection: None. The patient  has no past surgical history on file.  His family history includes Asthma in his mother.  Review of Systems  Constitutional: Negative for chills and fever.  Cardiovascular: Negative for chest pain.  Skin: Negative for rash.  Neurological: Negative for dizziness.    The problem list and medications were reviewed and updated by myself where necessary and exist elsewhere in the encounter.   OBJECTIVE:  BP (!) 144/100   Pulse 87   Temp 98.1 F (36.7 C) (Oral)   Resp 20   Ht 6' 0.5" (1.842 m)   Wt 279 lb 6.4 oz (126.7 kg)    SpO2 94%   BMI 37.37 kg/m   BP Readings from Last 3 Encounters:  10/19/16 (!) 144/100  09/09/16 133/83  04/09/16 124/70     Physical Exam  Constitutional: He is oriented to person, place, and time.  Cardiovascular: Normal rate, regular rhythm and normal heart sounds.  Exam reveals no gallop and no friction rub.   No murmur heard. Pulmonary/Chest: No respiratory distress. He has wheezes. He has no rales. He exhibits no tenderness.  Musculoskeletal: Normal range of motion.  Neurological: He is alert and oriented to person, place, and time.    Wt Readings from Last 3 Encounters:  10/19/16 279 lb 6.4 oz (126.7 kg)  09/09/16 277 lb 3.2 oz (125.7 kg)  04/09/16 275 lb (124.7 kg)     No results found for this or any previous visit (from the past 41 hour(s)).  No results found.  ASSESSMENT AND PLAN:  Derek Pacheco was seen today for wheezing.  Diagnoses and all orders for this visit:  COPD exacerbation ([place]) -     predniSONE (DELTASONE) 20 MG tablet; Take 3 in the morning for 3 days, then 2 in the morning for 3 days, and then 1 in the morning for 3 days. -     methylPREDNISolone sodium succinate (SOLU-MEDROL) 125 mg/2 mL injection 125 mg; Inject 2 mLs (125 mg total) into  the muscle once. -     azithromycin (ZITHROMAX) 250 MG tablet; Take two on day one and one daily thereafter.  Gastroesophageal reflux disease, esophagitis presence not specified -     pantoprazole (PROTONIX) 40 MG tablet; Take 1 tablet (40 mg total) by mouth daily. Always take on an empty stomach in the morning and eat something 30 minutes after taking.  Stage 2 moderate COPD by GOLD classification (HCC) -     albuterol (PROVENTIL) (2.5 MG/3ML) 0.083% nebulizer solution; Take 3 mLs (2.5 mg total) by nebulization every 6 (six) hours as needed for wheezing. Please place on filer for patient.    The patient is advised to call or return to clinic if he does not see an improvement in symptoms, or to seek the care of the  closest emergency department if he worsens with the above plan.   Philis Fendt, MHS, PA-C [facility] 10/21/2016 4:20 PM

## 2016-11-26 ENCOUNTER — Encounter: Payer: Self-pay | Admitting: Physician Assistant

## 2016-11-26 ENCOUNTER — Ambulatory Visit (INDEPENDENT_AMBULATORY_CARE_PROVIDER_SITE_OTHER): Payer: BLUE CROSS/BLUE SHIELD | Admitting: Physician Assistant

## 2016-11-26 VITALS — BP 150/100 | HR 89 | Temp 98.4°F | Resp 16 | Ht 72.05 in | Wt 277.2 lb

## 2016-11-26 DIAGNOSIS — F5104 Psychophysiologic insomnia: Secondary | ICD-10-CM

## 2016-11-26 DIAGNOSIS — J441 Chronic obstructive pulmonary disease with (acute) exacerbation: Secondary | ICD-10-CM | POA: Diagnosis not present

## 2016-11-26 DIAGNOSIS — B370 Candidal stomatitis: Secondary | ICD-10-CM | POA: Diagnosis not present

## 2016-11-26 MED ORDER — IPRATROPIUM BROMIDE 0.02 % IN SOLN
0.5000 mg | Freq: Once | RESPIRATORY_TRACT | Status: AC
  Administered 2016-11-26: 0.5 mg via RESPIRATORY_TRACT

## 2016-11-26 MED ORDER — PREDNISONE 20 MG PO TABS: ORAL_TABLET | ORAL | 0 refills | Status: AC

## 2016-11-26 MED ORDER — NYSTATIN 100000 UNIT/ML MT SUSP: 5.0000 mL | Freq: Four times a day (QID) | OROMUCOSAL | 0 refills | Status: DC

## 2016-11-26 MED ORDER — ALBUTEROL SULFATE (2.5 MG/3ML) 0.083% IN NEBU
2.5000 mg | INHALATION_SOLUTION | Freq: Once | RESPIRATORY_TRACT | Status: AC
  Administered 2016-11-26: 2.5 mg via RESPIRATORY_TRACT

## 2016-11-26 MED ORDER — METHYLPREDNISOLONE SODIUM SUCC 125 MG IJ SOLR
125.0000 mg | Freq: Once | INTRAMUSCULAR | Status: AC
  Administered 2016-11-26: 125 mg via INTRAMUSCULAR

## 2016-11-26 NOTE — Progress Notes (Signed)
11/26/2016 2:26 PM   DOB: [birth year] / MRN: [id number]  SUBJECTIVE:  Derek Pacheco is a 51 y.o. male presenting for cough.  Tells me he walked into a dust filled room yesterday after getting over a cold and says this pushed him over the age.    Has a history of elevated ambulatory measures however we have looked into this before with at home measures and these were no in hypertensive range.  He will monitor his BP over the next week and will come back in about two weeks for a recheck.   He complains of thrush in his mouth.  He is on multiple inhalers and frequently requires steroids to COPD flares.   Has a history of chronic insomnia and the only thing that has really helped him sleep has been Xanax 2 mg which I am not comfortable writing this for sleep.   He has No Known Allergies.   He  has a past medical history of Asthma; Bronchitis; Insomnia; and Migraines.    He  reports that he quit smoking about 10 years ago. His smoking use included Cigarettes. He has a 25.00 pack-year smoking history. He has never used smokeless tobacco. He reports that he does not drink alcohol or use drugs. He  reports that he currently engages in sexual activity. He reports using the following method of birth control/protection: None. The patient  has no past surgical history on file.  His family history includes Asthma in his mother.  Review of Systems  Constitutional: Negative for chills and fever.  Respiratory: Positive for cough, shortness of breath and wheezing.   Cardiovascular: Negative for chest pain and leg swelling.  Skin: Negative for rash.    The problem list and medications were reviewed and updated by myself where necessary and exist elsewhere in the encounter.   OBJECTIVE:  BP (!) 150/100   Pulse 89   Temp 98.4 F (36.9 C) (Oral)   Resp 16   Ht 6' 0.05" (1.83 m)   Wt 277 lb 3.2 oz (125.7 kg)   SpO2 94%   BMI 37.54 kg/m   Physical Exam  Constitutional: He is oriented to  person, place, and time.  Cardiovascular: Normal rate and regular rhythm.   Pulmonary/Chest: Effort normal. No respiratory distress. He has wheezes. He has no rales. He exhibits no tenderness.  Musculoskeletal: Normal range of motion.  Neurological: He is alert and oriented to person, place, and time. No cranial nerve deficit.    No results found for this or any previous visit (from the past 72 hour(s)).  No results found.  ASSESSMENT AND PLAN:  Derek Pacheco was seen today for cough.  Diagnoses and all orders for this visit:  Chronic insomnia: I have tried multiple medications.  I can not write him for Xanax for sleep, particularly in the setting of COPD. Advised that he see a sleep specialist as the most reasonable next step.  -     Ambulatory referral to Sleep Studies  Oral thrush -     nystatin (MYCOSTATIN) 100000 UNIT/ML suspension; Take 5 mLs (500,000 Units total) by mouth 4 (four) times daily. Nystatin swish and swallow.  COPD exacerbation ([name]): I'd like to try to move him to nebulizer therapy to optimize his COPD.  He will go to [facility] -     albuterol (PROVENTIL) (2.5 MG/3ML) 0.083% nebulizer solution 2.5 mg; Take 3 mLs (2.5 mg total) by nebulization once. -     ipratropium (ATROVENT) nebulizer solution 0.5  mg; Take 2.5 mLs (0.5 mg total) by nebulization once. -     methylPREDNISolone sodium succinate (SOLU-MEDROL) 125 mg/2 mL injection 125 mg; Inject 2 mLs (125 mg total) into the muscle once. -     predniSONE (DELTASONE) 20 MG tablet; Take 3 in the morning for 3 days, then 2 in the morning for 3 days, and then 1 in the morning for 3 days.    The patient is advised to call or return to clinic if he does not see an improvement in symptoms, or to seek the care of the closest emergency department if he worsens with the above plan.   Philis Fendt, MHS, PA-C [facility] 11/26/2016 2:26 PM

## 2016-12-10 ENCOUNTER — Encounter: Payer: BLUE CROSS/BLUE SHIELD | Admitting: Physician Assistant

## 2016-12-17 ENCOUNTER — Encounter: Payer: Self-pay | Admitting: Physician Assistant

## 2016-12-17 ENCOUNTER — Ambulatory Visit (INDEPENDENT_AMBULATORY_CARE_PROVIDER_SITE_OTHER): Payer: BLUE CROSS/BLUE SHIELD | Admitting: Physician Assistant

## 2016-12-17 VITALS — BP 142/100 | HR 59 | Temp 98.0°F | Resp 16 | Ht 72.05 in | Wt 277.4 lb

## 2016-12-17 DIAGNOSIS — Z0000 Encounter for general adult medical examination without abnormal findings: Secondary | ICD-10-CM | POA: Diagnosis not present

## 2016-12-17 DIAGNOSIS — Z1211 Encounter for screening for malignant neoplasm of colon: Secondary | ICD-10-CM

## 2016-12-17 DIAGNOSIS — Z130 Encounter for screening for diseases of the blood and blood-forming organs and certain disorders involving the immune mechanism: Secondary | ICD-10-CM

## 2016-12-17 DIAGNOSIS — Z1329 Encounter for screening for other suspected endocrine disorder: Secondary | ICD-10-CM

## 2016-12-17 DIAGNOSIS — Z13228 Encounter for screening for other metabolic disorders: Secondary | ICD-10-CM | POA: Diagnosis not present

## 2016-12-17 DIAGNOSIS — Z1321 Encounter for screening for nutritional disorder: Secondary | ICD-10-CM

## 2016-12-17 DIAGNOSIS — Z23 Encounter for immunization: Secondary | ICD-10-CM

## 2016-12-17 DIAGNOSIS — J989 Respiratory disorder, unspecified: Secondary | ICD-10-CM

## 2016-12-17 MED ORDER — TRAZODONE HCL 50 MG PO TABS: 50.0000 mg | ORAL_TABLET | Freq: Every evening | ORAL | 3 refills | Status: DC | PRN

## 2016-12-17 NOTE — Progress Notes (Addendum)
12/17/2016 2:59 PM   DOB: 11-Mar-1966 / MRN: [id number]  SUBJECTIVE:  Derek Pacheco is a 51 y.o. male presenting for annual exam. HM as follows.   Health Maintenance Due  Topic Date Due  . Hepatitis C Screening  May 28, 1965  . HIV Screening  11/05/1978  . COLONOSCOPY  11/04/2013    BP always terrible in the office but he is monitoring at home and provides the numbers to me here today:      He is behind a colonoscopy and is willing to go for that at this time.     Immunization History  Administered Date(s) Administered  . Influenza,inj,Quad PF,6+ Mos 12/12/2015  . Pneumococcal Polysaccharide-23 02/24/2016  . Tdap 05/31/2012     He has No Known Allergies.   He  has a past medical history of Asthma; Bronchitis; Insomnia; and Migraines.    He  reports that he quit smoking about 10 years ago. His smoking use included Cigarettes. He has a 25.00 pack-year smoking history. He has never used smokeless tobacco. He reports that he does not drink alcohol or use drugs. He  reports that he currently engages in sexual activity. He reports using the following method of birth control/protection: None. The patient  has no past surgical history on file.  His family history includes Asthma in his mother.  Review of Systems  Constitutional: Negative for chills, diaphoresis and fever.  Eyes: Negative.   Respiratory: Negative for cough, hemoptysis, sputum production, shortness of breath and wheezing.   Cardiovascular: Negative for chest pain, orthopnea and leg swelling.  Gastrointestinal: Negative for nausea.  Skin: Negative for rash.  Neurological: Negative for dizziness, sensory change, speech change, focal weakness and headaches.    The problem list and medications were reviewed and updated by myself where necessary and exist elsewhere in the encounter.   OBJECTIVE:  BP (!) 142/100 (BP Location: Right Arm, Patient Position: Sitting, Cuff Size: Large)   Pulse (!) 59   Temp 98 F  (36.7 C) (Oral)   Resp 16   Ht 6' 0.05" (1.83 m)   Wt 277 lb 6.4 oz (125.8 kg)   SpO2 95%   BMI 37.57 kg/m   BP Readings from Last 3 Encounters:  12/17/16 (!) 142/100  11/26/16 (!) 150/100  10/19/16 (!) 144/100    Physical Exam  Constitutional: He appears well-developed. He is active and cooperative.  Non-toxic appearance.  Cardiovascular: Normal rate, regular rhythm, S1 normal, S2 normal, normal heart sounds, intact distal pulses and normal pulses.  Exam reveals no gallop and no friction rub.   No murmur heard. Pulmonary/Chest: Effort normal. No tachypnea. He has no rales.  Abdominal: He exhibits no distension.  Musculoskeletal: He exhibits no edema.  Neurological: He is alert.  Skin: Skin is warm and dry. He is not diaphoretic. No pallor.  Vitals reviewed.   Lab Results  Component Value Date   CREATININE 1.09 09/09/2016   BUN 17 09/09/2016   NA 143 09/09/2016   K 4.2 09/09/2016   CL 105 09/09/2016   CO2 25 09/09/2016   Lab Results  Component Value Date   WBC 7.1 09/09/2016   HGB 15.3 09/09/2016   HCT 44.1 09/09/2016   MCV 87.6 09/09/2016   PLT 204 07/02/2015   Lab Results  Component Value Date   ALT 50 (H) 09/09/2016   AST 39 09/09/2016   ALKPHOS 77 09/09/2016   BILITOT 0.8 09/09/2016   Lab Results  Component Value Date   HGBA1C  5.7 05/31/2015    .  No results found for this or any previous visit (from the past 72 hour(s)).  No results found.  ASSESSMENT AND PLAN:  Derek Pacheco was seen today for annual exam.  Diagnoses and all orders for this visit:  Annual physical exam  Needs flu shot -     Flu Vaccine QUAD 36+ mos IM  Chronic respiratory disease -     Ambulatory referral to Respiratory Therapy  Screening for colon cancer -     Ambulatory referral to Gastroenterology  Screening for endocrine, nutritional, metabolic and immunity disorder: Will rescreen lipids as he was most likely not fasting on previous measure.  Future order is placed.    -     Hemoglobin A1c -     Lipid panel  Other orders -     Cancel: Tdap vaccine greater than or equal to 7yo IM    The patient is advised to call or return to clinic if he does not see an improvement in symptoms, or to seek the care of the closest emergency department if he worsens with the above plan.   Philis Fendt, MHS, PA-C [facility] 12/17/2016 2:59 PM

## 2016-12-17 NOTE — Patient Instructions (Signed)
     IF you received an x-ray today, you will receive an invoice from [facility]. Please contact [facility] at [phone] with questions or concerns regarding your invoice.   IF you received labwork today, you will receive an invoice from [facility]. Please contact [facility] at [phone] with questions or concerns regarding your invoice.   Our billing staff will not be able to assist you with questions regarding bills from these companies.  You will be contacted with the lab results as soon as they are available. The fastest way to get your results is to activate your My Chart account. Instructions are located on the last page of this paperwork. If you have not heard from us regarding the results in 2 weeks, please contact this office.     

## 2016-12-18 LAB — LIPID PANEL
CHOL/HDL RATIO: 6.3 ratio — AB (ref 0.0–5.0)
Cholesterol, Total: 208 mg/dL — ABNORMAL HIGH (ref 100–199)
HDL: 33 mg/dL — ABNORMAL LOW (ref >39)
TRIGLYCERIDES: 569 mg/dL — AB (ref 0–149)

## 2016-12-18 LAB — HEMOGLOBIN A1C
Est. average glucose Bld gHb Est-mCnc: 117 mg/dL
HEMOGLOBIN A1C: 5.7 % — AB (ref 4.8–5.6)

## 2016-12-20 ENCOUNTER — Telehealth: Payer: Self-pay | Admitting: Physician Assistant

## 2016-12-20 NOTE — Telephone Encounter (Signed)
Pt states he got a call from [facility] that was jubbled and he could not understand anything and would like a call back   Best number [phone]

## 2016-12-20 NOTE — Addendum Note (Signed)
Addended by: Tereasa Coop on: 12/20/2016 09:53 AM   Modules accepted: Orders

## 2016-12-21 ENCOUNTER — Encounter: Payer: Self-pay | Admitting: Gastroenterology

## 2016-12-22 NOTE — Telephone Encounter (Signed)
Please advise 

## 2016-12-24 ENCOUNTER — Ambulatory Visit: Payer: BLUE CROSS/BLUE SHIELD | Admitting: Urgent Care

## 2016-12-24 DIAGNOSIS — Z13228 Encounter for screening for other metabolic disorders: Secondary | ICD-10-CM | POA: Diagnosis not present

## 2016-12-24 DIAGNOSIS — Z130 Encounter for screening for diseases of the blood and blood-forming organs and certain disorders involving the immune mechanism: Secondary | ICD-10-CM

## 2016-12-24 DIAGNOSIS — Z1329 Encounter for screening for other suspected endocrine disorder: Secondary | ICD-10-CM | POA: Diagnosis not present

## 2016-12-24 DIAGNOSIS — Z1321 Encounter for screening for nutritional disorder: Secondary | ICD-10-CM | POA: Diagnosis not present

## 2016-12-25 LAB — LIPID PANEL
CHOL/HDL RATIO: 5.1 ratio — AB (ref 0.0–5.0)
Cholesterol, Total: 173 mg/dL (ref 100–199)
HDL: 34 mg/dL — ABNORMAL LOW (ref >39)
LDL CALC: 85 mg/dL (ref 0–99)
Triglycerides: 271 mg/dL — ABNORMAL HIGH (ref 0–149)
VLDL CHOLESTEROL CAL: 54 mg/dL — AB (ref 5–40)

## 2016-12-27 NOTE — Progress Notes (Signed)
FYI: He does not qualify for statin using ambulatory BP measures. Philis Fendt, MS, PA-C 2:01 PM, 12/27/2016

## 2016-12-30 ENCOUNTER — Encounter (INDEPENDENT_AMBULATORY_CARE_PROVIDER_SITE_OTHER): Payer: Self-pay

## 2016-12-30 ENCOUNTER — Encounter: Payer: Self-pay | Admitting: Neurology

## 2016-12-30 ENCOUNTER — Ambulatory Visit (INDEPENDENT_AMBULATORY_CARE_PROVIDER_SITE_OTHER): Payer: BLUE CROSS/BLUE SHIELD | Admitting: Neurology

## 2016-12-30 VITALS — BP 167/100 | HR 88 | Ht 72.0 in | Wt 282.0 lb

## 2016-12-30 DIAGNOSIS — E669 Obesity, unspecified: Secondary | ICD-10-CM

## 2016-12-30 DIAGNOSIS — R51 Headache: Secondary | ICD-10-CM

## 2016-12-30 DIAGNOSIS — G2581 Restless legs syndrome: Secondary | ICD-10-CM | POA: Diagnosis not present

## 2016-12-30 DIAGNOSIS — J42 Unspecified chronic bronchitis: Secondary | ICD-10-CM

## 2016-12-30 DIAGNOSIS — G479 Sleep disorder, unspecified: Secondary | ICD-10-CM | POA: Diagnosis not present

## 2016-12-30 DIAGNOSIS — R519 Headache, unspecified: Secondary | ICD-10-CM

## 2016-12-30 DIAGNOSIS — G478 Other sleep disorders: Secondary | ICD-10-CM

## 2016-12-30 DIAGNOSIS — R0683 Snoring: Secondary | ICD-10-CM

## 2016-12-30 DIAGNOSIS — G4719 Other hypersomnia: Secondary | ICD-10-CM

## 2016-12-30 NOTE — Progress Notes (Signed)
Subjective:    Patient ID: Derek Pacheco is a 51 y.o. male.  HPI     Star Age, MD, PhD [facility] [address], [address] [place], [place] [address]  Dear Derek Pacheco,   I saw your patient, Derek Pacheco, upon your kind request in my neurologic clinic today for initial consultation of his sleep disorder, in particular, concern for underlying obstructive sleep apnea. The patient is unaccompanied today. As you know, Derek Pacheco is a 51 year old right-handed gentleman with an underlying medical history of asthma, bronchitis, migraine headaches, elevated blood pressure values and obesity, who reports chronic difficulty with his sleep including nonrestorative sleep, daytime tiredness. I reviewed your office note from 11/26/2016 as well as 12/17/2016. He has taken Xanax at night for sleep, but has not had any in months. In the past, he has tried multiple other prescription medications for sleep including Ambien which did not help, Lunesta, Ativan, Valium, so not a, clonazepam, all of which did not help, most recently, he tried Belsomra, which did not help. He is not sure if he snores, girlfriend does not complain about it. She sleeps in a separate bedroom however and has been diagnosed with sleep apnea, uses a CPAP machine successfully. He has very occasional restless leg symptoms, is a restless sleeper, tosses and turns at times, sometimes tries to sleep with a pillow in between his legs. He has had occasional morning headache and nocturia about once per average night, wake up time for work is around 5 or 6 and bedtime typically somewhere between 10 PM and midnight, he typically watches TV on a timer for about 20 minutes. He has 1 dog and 2 cats, sometimes in his bed. He lives with his long-term girlfriend, they have no children. His Epworth sleepiness score is about 9 out of 24 today, fatigue score is 49 out of 63. He quit smoking in 2008, he drinks alcohol  occasionally, caffeine in the form soda, about 3-4, but some without.   His Past Medical History Is Significant For: Past Medical History:  Diagnosis Date  . Asthma   . Bronchitis   . Insomnia   . Migraines     His Past Surgical History Is Significant For: No past surgical history on file.  His Family History Is Significant For: Family History  Problem Relation Age of Onset  . Asthma Mother     His Social History Is Significant For: Social History   Social History  . Marital status: Significant Other    Spouse name: N/A  . Number of children: N/A  . Years of education: N/A   Social History Main Topics  . Smoking status: Former Smoker    Packs/day: 1.00    Years: 25.00    Types: Cigarettes    Quit date: 03/29/2006  . Smokeless tobacco: Never Used  . Alcohol use No  . Drug use: No  . Sexual activity: Yes    Birth control/ protection: None   Other Topics Concern  . None   Social History Narrative  . None    His Allergies Are:  No Known Allergies:   His Current Medications Are:  Outpatient Encounter Prescriptions as of 12/30/2016  Medication Sig  . acyclovir ointment (ZOVIRAX) 5 % USE EVERY 4 TO 6 HOURS AS NEEDED FOR COLD SORES  . albuterol (PROVENTIL) (2.5 MG/3ML) 0.083% nebulizer solution Take 3 mLs (2.5 mg total) by nebulization every 6 (six) hours as needed for wheezing. Please place on filer for  patient.  . budesonide-formoterol (SYMBICORT) 160-4.5 MCG/ACT inhaler Inhale 2 puffs into the lungs 2 (two) times daily.  . fluticasone (FLONASE) 50 MCG/ACT nasal spray Place 2 sprays into both nostrils daily as needed for allergies or rhinitis.  . fluticasone (FLONASE) 50 MCG/ACT nasal spray USE TWO SPRAY(S) IN EACH NOSTRIL ONCE DAILY  . ipratropium (ATROVENT) 0.02 % nebulizer solution Take 2.5 mLs (0.5 mg total) by nebulization 4 (four) times daily.  . montelukast (SINGULAIR) 10 MG tablet Take 1 tablet (10 mg total) by mouth at bedtime.  Marland Kitchen nystatin (MYCOSTATIN)  100000 UNIT/ML suspension Take 5 mLs (500,000 Units total) by mouth 4 (four) times daily. Nystatin swish and swallow.  . pantoprazole (PROTONIX) 40 MG tablet Take 1 tablet (40 mg total) by mouth daily. Always take on an empty stomach in the morning and eat something 30 minutes after taking.  . Peak Flow Meter (AIRZONE PEAK FLOW METER) DEVI Please use daily to keep track of your breathing.  . SUMAtriptan (IMITREX) 100 MG tablet TAKE ONE-HALF TO ONE TABLET BY MOUTH AS NEEDED FOR  MIGRAINE.  MAX  200  MG  IN  24  HOURS  . topiramate (TOPAMAX) 50 MG tablet Take 1 tablet (50 mg total) by mouth at bedtime. (Patient taking differently: Take 50 mg by mouth daily as needed (for cluster migraine). )  . traZODone (DESYREL) 50 MG tablet Take 1-2 tablets (50-100 mg total) by mouth at bedtime as needed for sleep.  . [DISCONTINUED] umeclidinium bromide (INCRUSE ELLIPTA) 62.5 MCG/INH AEPB Inhale 1 puff into the lungs daily.   No facility-administered encounter medications on file as of 12/30/2016.   :  Review of Systems:  Out of a complete 14 point review of systems, all are reviewed and negative with the exception of these symptoms as listed below: Review of Systems  Neurological:       Pt presents today to discuss his sleep. Pt has never had a sleep study. Pt does not endorse significant snoring. Pt averages 4 hours of sleep per night and is complaining of insomnia.  Epworth Sleepiness Scale 0= would never doze 1= slight chance of dozing 2= moderate chance of dozing 3= high chance of dozing  Sitting and reading: 2 Watching TV: 2 Sitting inactive in a public place (ex. Theater or meeting):1 As a passenger in a car for an hour without a break: 0-1 Lying down to rest in the afternoon: 1 Sitting and talking to someone: 0 Sitting quietly after lunch (no alcohol): 1 In a car, while stopped in traffic: 1 Total: 8-9     Objective:  Neurological Exam  Physical Exam Physical Examination:   Vitals:    12/30/16 1552  BP: (!) 167/100  Pulse: 88    General Examination: The patient is a very pleasant 51 y.o. male in no acute distress. He appears well-developed and well-nourished and well groomed.   HEENT: Normocephalic, atraumatic, pupils are equal, round and reactive to light and accommodation. Extraocular tracking is good without limitation to gaze excursion or nystagmus noted. Normal smooth pursuit is noted. Hearing is grossly intact. Face is symmetric with normal facial animation and normal facial sensation. Speech is clear with no dysarthria noted. There is no hypophonia. There is no lip, neck/head, jaw or voice tremor. Neck is supple with full range of passive and active motion. There are no carotid bruits on auscultation. Oropharynx exam reveals: mild mouth dryness, adequate dental hygiene and moderate airway crowding, due to larger uvula and redundant soft palate, thicker  tongue. Mallampati is class III. Tongue protrudes centrally and palate elevates symmetrically. Tonsils are about 1+ in size. Neck size is 18 7/8 inches. He has a minimal overbite.   Chest: Clear to auscultation without wheezing, rhonchi or crackles noted.  Heart: S1+S2+0, regular and normal without murmurs, rubs or gallops noted.   Abdomen: Soft, non-tender and non-distended with normal bowel sounds appreciated on auscultation.  Extremities: There is no pitting edema in the distal lower extremities bilaterally. Pedal pulses are intact.  Skin: Warm and dry without trophic changes noted.  Musculoskeletal: exam reveals no obvious joint deformities, tenderness or joint swelling or erythema.   Neurologically:  Mental status: The patient is awake, alert and oriented in all 4 spheres. His immediate and remote memory, attention, language skills and fund of knowledge are appropriate. There is no evidence of aphasia, agnosia, apraxia or anomia. Speech is clear with normal prosody and enunciation. Thought process is linear. Mood  is normal and affect is normal.  Cranial nerves II - XII are as described above under HEENT exam. In addition: shoulder shrug is normal with equal shoulder height noted. Motor exam: Normal bulk, strength and tone is noted. There is no drift, tremor or rebound. Romberg is negative. Reflexes are 2+ throughout. Fine motor skills and coordination: intact with normal finger taps, normal hand movements, normal rapid alternating patting, normal foot taps and normal foot agility.  Cerebellar testing: No dysmetria or intention tremor on finger to nose testing. Heel to shin is unremarkable bilaterally. There is no truncal or gait ataxia.  Sensory exam: intact to light touch in the upper and lower extremities.  Gait, station and balance: He stands easily. No veering to one side is noted. No leaning to one side is noted. Posture is age-appropriate and stance is narrow based. Gait shows normal stride length and normal pace. No problems turning are noted. Tandem walk is unremarkable.   Assessment and Plan:  In summary, Derek Pacheco is a very pleasant 51 y.o.-year old male with an underlying medical history of asthma, bronchitis, migraine headaches, elevated blood pressure values and obesity, who Presents for consultation of his chronic sleep difficulties. His history and exam are concerning for underlying obstructive sleep apnea. For his chronic sleep onset and sleep maintenance difficulties he has tried multiple medications. He also endorses some restless leg symptoms. Sleep study testing with an attended sleep study is indicated to rule out an underlying organic cause of for his symptoms. He would be willing to proceed. Ultimately, for chronic insomnia he would be advised to seek consultation with a sleep psychologist. He is encouraged to discuss this with you. I had a long chat with the patient about my findings and the diagnosis of OSA, its prognosis and treatment options. We talked about medical treatments,  surgical interventions and non-pharmacological approaches. I explained in particular the risks and ramifications of untreated moderate to severe OSA, especially with respect to developing cardiovascular disease down the Road, including congestive heart failure, difficult to treat hypertension, cardiac arrhythmias, or stroke. Even type 2 diabetes has, in part, been linked to untreated OSA. Symptoms of untreated OSA include daytime sleepiness, memory problems, mood irritability and mood disorder such as depression and anxiety, lack of energy, as well as recurrent headaches, especially morning headaches. We talked about trying to maintain a healthy lifestyle in general, as well as the importance of weight control. I encouraged the patient to eat healthy, exercise daily and keep well hydrated, to keep a scheduled bedtime and wake  time routine, to not skip any meals and eat healthy snacks in between meals. I advised the patient not to drive when feeling sleepy. I recommended the following at this time: sleep study with potential positive airway pressure titration. (We will score hypopneas at 3%).   I explained the sleep test procedure to the patient and also outlined possible surgical and non-surgical treatment options of OSA, including the use of a custom-made dental device (which would require a referral to a specialist dentist or oral surgeon), upper airway surgical options, such as pillar implants, radiofrequency surgery, tongue base surgery, and UPPP (which would involve a referral to an ENT surgeon). Rarely, jaw surgery such as mandibular advancement may be considered.  I also explained the CPAP treatment option to the patient, who indicated that he would be willing to try CPAP if the need arises. I explained the importance of being compliant with PAP treatment, not only for insurance purposes but primarily to improve His symptoms, and for the patient's long term health benefit, including to reduce His  cardiovascular risks. I answered all his questions today and the patient was in agreement. I would like to see him back after the sleep study is completed and encouraged him to call with any interim questions, concerns, problems or updates.   Thank you very much for allowing me to participate in the care of this nice patient. If I can be of any further assistance to you please do not hesitate to call me at [phone].  Sincerely,   Star Age, MD, PhD

## 2016-12-30 NOTE — Patient Instructions (Signed)
Based on your symptoms and your exam I believe we should look for an underlying organic sleep disorder, such as obstructive sleep apnea/OSA, or dream enactments, or periodic leg movement disorder (PLMD), which is leg twitching in sleep. While this is typically associated with symptoms of restless leg syndrome, patients can have periodic leg movements, that is, repetitive twitching in their sleep secondary to medication effects, particularly from taking an SSRI type antidepressant. Sometimes changing the antidepressant regimen can help. If this leg movement disorder disrupts your sleep, it can cause other problems such as daytime sleepiness. Please do not drive if you feel sleepy. Sometimes other conditions can cause leg twitching at night including thyroid dysfunction and iron deficiency. There are some symptomatic medications available for treatment. At this point, we should proceed with a sleep study to further delineate your sleep-related issues.   Ultimately, your chronic insomnia may be best treated with cognitive behavioral therapy (CBT-I).   Please talk to Legrand Como about this too   If you have more than mild OSA, I want you to consider treatment with CPAP. Please remember, the risks and ramifications of moderate to severe obstructive sleep apnea or OSA are: Cardiovascular disease, including congestive heart failure, stroke, difficult to control hypertension, arrhythmias, and even type 2 diabetes has been linked to untreated OSA. Sleep apnea causes disruption of sleep and sleep deprivation in most cases, which, in turn, can cause recurrent headaches, problems with memory, mood, concentration, focus, and vigilance. Most people with untreated sleep apnea report excessive daytime sleepiness, which can affect their ability to drive. Please do not drive if you feel sleepy.   I will see you back after your sleep study to go over the test results and where to go from there. We will call you after your sleep  study to advise about the results (most likely, you will hear from [place], my nurse) and to set up an appointment at the time.    Our sleep lab administrative assistant, Arrie Aran will call you to schedule your sleep study. If you don't hear back from her by about 2 weeks from now, please feel free to call her at [phone]. This is her direct line and please leave a message with your phone number to call back if you get the voicemail box. She will call back as soon as possible.

## 2017-01-12 ENCOUNTER — Telehealth: Payer: Self-pay | Admitting: Neurology

## 2017-01-12 DIAGNOSIS — G479 Sleep disorder, unspecified: Secondary | ICD-10-CM

## 2017-01-12 NOTE — Telephone Encounter (Signed)
[employer] denied Split sleep and approved HST.  Can I get an order for HST? °

## 2017-02-01 ENCOUNTER — Ambulatory Visit: Payer: BLUE CROSS/BLUE SHIELD | Admitting: Internal Medicine

## 2017-02-07 ENCOUNTER — Ambulatory Visit: Payer: BLUE CROSS/BLUE SHIELD | Admitting: Adult Health

## 2017-02-07 ENCOUNTER — Encounter: Payer: Self-pay | Admitting: Adult Health

## 2017-02-07 ENCOUNTER — Ambulatory Visit (INDEPENDENT_AMBULATORY_CARE_PROVIDER_SITE_OTHER): Payer: BLUE CROSS/BLUE SHIELD | Admitting: Neurology

## 2017-02-07 ENCOUNTER — Other Ambulatory Visit: Payer: Self-pay

## 2017-02-07 ENCOUNTER — Ambulatory Visit (AMBULATORY_SURGERY_CENTER): Payer: Self-pay | Admitting: *Deleted

## 2017-02-07 VITALS — Ht 72.0 in | Wt 276.6 lb

## 2017-02-07 DIAGNOSIS — J4550 Severe persistent asthma, uncomplicated: Secondary | ICD-10-CM | POA: Diagnosis not present

## 2017-02-07 DIAGNOSIS — G4733 Obstructive sleep apnea (adult) (pediatric): Secondary | ICD-10-CM

## 2017-02-07 DIAGNOSIS — Z1211 Encounter for screening for malignant neoplasm of colon: Secondary | ICD-10-CM

## 2017-02-07 DIAGNOSIS — G4734 Idiopathic sleep related nonobstructive alveolar hypoventilation: Secondary | ICD-10-CM

## 2017-02-07 DIAGNOSIS — J449 Chronic obstructive pulmonary disease, unspecified: Secondary | ICD-10-CM

## 2017-02-07 DIAGNOSIS — G479 Sleep disorder, unspecified: Secondary | ICD-10-CM

## 2017-02-07 MED ORDER — BUDESONIDE-FORMOTEROL FUMARATE 160-4.5 MCG/ACT IN AERO: 2.0000 | INHALATION_SPRAY | Freq: Two times a day (BID) | RESPIRATORY_TRACT | 0 refills | Status: DC

## 2017-02-07 MED ORDER — NA SULFATE-K SULFATE-MG SULF 17.5-3.13-1.6 GM/177ML PO SOLN: 1.0000 [IU] | Freq: Once | ORAL | 0 refills | Status: AC

## 2017-02-07 NOTE — Assessment & Plan Note (Addendum)
Controlled on rx - consider add LAMA if frequent flares   Plan  Patient Instructions  Continue on current regimen .  Follow up in 6 months and As needed

## 2017-02-07 NOTE — Patient Instructions (Signed)
Continue on current regimen .  Follow up in 6 months and As needed

## 2017-02-07 NOTE — Progress Notes (Signed)
No egg or soy allergy known to patient  No issues with past sedation with any surgeries  or procedures, no intubation problems  No diet pills per patient No home 02 use per patient  No blood thinners per patient  Pt denies issues with constipation on stool softeners  No A fib or A flutter  EMMI video sent to pt's e mail

## 2017-02-07 NOTE — Assessment & Plan Note (Signed)
Controlled without flare  CXR 03/2016 clear   Plan  Patient Instructions  Continue on current regimen .  Follow up in 6 months and As needed

## 2017-02-07 NOTE — Progress Notes (Signed)
$'@Patient'E$  ID: Derek Pacheco, male    DOB: 02-01-1966, 51 y.o.   MRN: [id number]  Chief Complaint  Patient presents with  . Follow-up    COPD     Referring provider: [name]  HPI: 51 year old male former smoker followed for COPD and asthma  Test November 03, 2015 FEV1 53%, ratio 55, +37 BD response, FVC 74%. DLCO 71%.  Alpha-1 MM phenotype   02/07/2017 Follow up : COPD /Asthma  Patient returns for a one year follow-up.  Patient remains on Symbicort . Previously on INCRUSE in past but did not feel it helped. Marland Kitchen  He says his breathing has been doing okay , has occasional wheeze. Gets winded with heavy activity.  He works in [employer] , dust aggravates his breathing. . Is able to go up stairs and carries heavy items. Uses albuterol intermittently . Few times a week. Has used steroids x 2 in last year. Most days says he is doing well.  He denies any chest pain orthopnea PND or leg swelling.  Flu shot is utd.  CAT score 18 .   No Known Allergies  Immunization History  Administered Date(s) Administered  . Influenza,inj,Quad PF,6+ Mos 12/12/2015, 12/17/2016  . Pneumococcal Polysaccharide-23 02/24/2016  . Tdap 05/31/2012    Past Medical History:  Diagnosis Date  . Allergy   . Asthma   . Bronchitis   . GERD (gastroesophageal reflux disease)   . Insomnia   . Migraines     Tobacco History: Social History   Tobacco Use  Smoking Status Former Smoker  . Packs/day: 1.00  . Years: 25.00  . Pack years: 25.00  . Types: Cigarettes  . Last attempt to quit: 03/29/2006  . Years since quitting: 10.8  Smokeless Tobacco Never Used   Counseling given: Not Answered   Outpatient Encounter Medications as of 02/07/2017  Medication Sig  . acyclovir ointment (ZOVIRAX) 5 % USE EVERY 4 TO 6 HOURS AS NEEDED FOR COLD SORES  . albuterol (PROVENTIL) (2.5 MG/3ML) 0.083% nebulizer solution Take 3 mLs (2.5 mg total) by nebulization every 6 (six) hours as needed for wheezing.  Please place on filer for patient.  . budesonide-formoterol (SYMBICORT) 160-4.5 MCG/ACT inhaler Inhale 2 puffs into the lungs 2 (two) times daily.  . fluticasone (FLONASE) 50 MCG/ACT nasal spray Place 2 sprays into both nostrils daily as needed for allergies or rhinitis.  Marland Kitchen ipratropium (ATROVENT) 0.02 % nebulizer solution Take 2.5 mLs (0.5 mg total) by nebulization 4 (four) times daily.  . montelukast (SINGULAIR) 10 MG tablet Take 1 tablet (10 mg total) by mouth at bedtime.  . Na Sulfate-K Sulfate-Mg Sulf (SUPREP BOWEL PREP KIT) 17.5-3.13-1.6 GM/177ML SOLN Take 1 Units once for 1 dose by mouth.  . NON FORMULARY Stool softeners take daily due to hemorrhoids  . nystatin (MYCOSTATIN) 100000 UNIT/ML suspension Take 5 mLs (500,000 Units total) by mouth 4 (four) times daily. Nystatin swish and swallow.  . pantoprazole (PROTONIX) 40 MG tablet Take 1 tablet (40 mg total) by mouth daily. Always take on an empty stomach in the morning and eat something 30 minutes after taking.  . Peak Flow Meter (AIRZONE PEAK FLOW METER) DEVI Please use daily to keep track of your breathing.  . SUMAtriptan (IMITREX) 100 MG tablet TAKE ONE-HALF TO ONE TABLET BY MOUTH AS NEEDED FOR  MIGRAINE.  MAX  200  MG  IN  24  HOURS  . topiramate (TOPAMAX) 50 MG tablet Take 1 tablet (50 mg total) by mouth  at bedtime. (Patient taking differently: Take 50 mg by mouth daily as needed (for cluster migraine). )  . traZODone (DESYREL) 50 MG tablet Take 1-2 tablets (50-100 mg total) by mouth at bedtime as needed for sleep.   No facility-administered encounter medications on file as of 02/07/2017.      Review of Systems  Constitutional:   No  weight loss, night sweats,  Fevers, chills, fatigue, or  lassitude.  HEENT:   No headaches,  Difficulty swallowing,  Tooth/dental problems, or  Sore throat,                No sneezing, itching, ear ache, nasal congestion, post nasal drip,   CV:  No chest pain,  Orthopnea, PND, swelling in lower  extremities, anasarca, dizziness, palpitations, syncope.   GI  No heartburn, indigestion, abdominal pain, nausea, vomiting, diarrhea, change in bowel habits, loss of appetite, bloody stools.   Resp:  .  No chest wall deformity  Skin: no rash or lesions.  GU: no dysuria, change in color of urine, no urgency or frequency.  No flank pain, no hematuria   MS:  No joint pain or swelling.  No decreased range of motion.  No back pain.    Physical Exam  BP 138/78 (BP Location: Right Arm, Cuff Size: Large)   Pulse 92   Ht 6' (1.829 m)   Wt 276 lb (125.2 kg)   SpO2 98%   BMI 37.43 kg/m   GEN: A/Ox3; pleasant , NAD , obese    HEENT:  [place]/AT,  EACs-clear, TMs-wnl, NOSE-clear, THROAT-clear, no lesions, no postnasal drip or exudate noted.   NECK:  Supple w/ fair ROM; no JVD; normal carotid impulses w/o bruits; no thyromegaly or nodules palpated; no lymphadenopathy.    RESP  Clear  P & A; w/o, wheezes/ rales/ or rhonchi. no accessory muscle use, no dullness to percussion  CARD:  RRR, no m/r/g, no peripheral edema, pulses intact, no cyanosis or clubbing.  GI:   Soft & nt; nml bowel sounds; no organomegaly or masses detected.   Musco: Warm bil, no deformities or joint swelling noted.   Neuro: alert, no focal deficits noted.    Skin: Warm, no lesions or rashes    Lab Results:  CBC   BMET  Imaging: No results found.   Assessment & Plan:   Asthma, chronic Controlled on rx - consider add LAMA if frequent flares   Plan  Patient Instructions  Continue on current regimen .  Follow up in 6 months and As needed           [name], NP 02/07/2017

## 2017-02-07 NOTE — Addendum Note (Signed)
Addended by: Parke Poisson E on: 02/07/2017 12:39 PM   Modules accepted: Orders

## 2017-02-09 ENCOUNTER — Other Ambulatory Visit: Payer: Self-pay

## 2017-02-09 ENCOUNTER — Ambulatory Visit: Payer: BLUE CROSS/BLUE SHIELD | Admitting: Physician Assistant

## 2017-02-09 ENCOUNTER — Encounter: Payer: Self-pay | Admitting: Physician Assistant

## 2017-02-09 VITALS — BP 148/103 | HR 92 | Temp 98.1°F | Resp 16 | Ht 71.0 in | Wt 271.4 lb

## 2017-02-09 DIAGNOSIS — J4550 Severe persistent asthma, uncomplicated: Secondary | ICD-10-CM

## 2017-02-09 DIAGNOSIS — J441 Chronic obstructive pulmonary disease with (acute) exacerbation: Secondary | ICD-10-CM | POA: Diagnosis not present

## 2017-02-09 MED ORDER — AMOXICILLIN-POT CLAVULANATE 875-125 MG PO TABS: 1.0000 | ORAL_TABLET | Freq: Two times a day (BID) | ORAL | 0 refills | Status: AC

## 2017-02-09 MED ORDER — UMECLIDINIUM BROMIDE 62.5 MCG/INH IN AEPB: 1.0000 | INHALATION_SPRAY | Freq: Every day | RESPIRATORY_TRACT | 9 refills | Status: DC

## 2017-02-09 MED ORDER — ALBUTEROL SULFATE (2.5 MG/3ML) 0.083% IN NEBU
5.0000 mg | INHALATION_SOLUTION | Freq: Once | RESPIRATORY_TRACT | Status: AC
  Administered 2017-02-09: 5 mg via RESPIRATORY_TRACT

## 2017-02-09 MED ORDER — IPRATROPIUM BROMIDE 0.02 % IN SOLN
0.5000 mg | Freq: Once | RESPIRATORY_TRACT | Status: AC
  Administered 2017-02-09: 0.5 mg via RESPIRATORY_TRACT

## 2017-02-09 MED ORDER — PREDNISONE 20 MG PO TABS: 40.0000 mg | ORAL_TABLET | Freq: Every day | ORAL | 0 refills | Status: AC

## 2017-02-09 MED ORDER — ALBUTEROL SULFATE (2.5 MG/3ML) 0.083% IN NEBU
2.5000 mg | INHALATION_SOLUTION | Freq: Once | RESPIRATORY_TRACT | Status: AC
  Administered 2017-02-09: 2.5 mg via RESPIRATORY_TRACT

## 2017-02-09 MED ORDER — METHYLPREDNISOLONE SODIUM SUCC 125 MG IJ SOLR
125.0000 mg | Freq: Once | INTRAMUSCULAR | Status: AC
  Administered 2017-02-09: 125 mg via INTRAMUSCULAR

## 2017-02-09 NOTE — Progress Notes (Signed)
Derek Pacheco  MRN: [id number] DOB: 02/28/1966  PCP: [name]  Subjective:  Pt is a 51 year old male PMH COPD and asthma who presents to clinic for difficulty breathing x 2 days.  Endorses shob, DOE, chest tightness and wheezing. He was at work yesterday and felt irritated with breathing. 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    H/o COPD - h/o breathing problems 2/2 COPD. OV with pulm two days ago for one year f/u. He was feeling great at that time.  Needs refill of Incruse.   Review of Systems  Constitutional: Negative for chills, diaphoresis, fatigue and fever.  HENT: Negative for congestion.   Respiratory: Positive for cough, shortness of breath and wheezing. Negative for chest tightness.   Cardiovascular: Negative for chest pain and palpitations.  Gastrointestinal: Negative for nausea and vomiting.    Patient Active Problem List   Diagnosis Date Noted  . Elevated LFTs 09/13/2016  . Stopped smoking with greater than 20 pack year history 10/23/2015  . History of wheezing 10/23/2015  . COPD (chronic obstructive pulmonary disease) ([place]) 07/02/2015  . Second hand tobacco smoke exposure 07/02/2015  . Plantar fascial fibromatosis 02/06/2014  . Asthma, chronic 05/10/2012  . Insomnia 05/10/2012    Current Outpatient Medications on File Prior to Visit  Medication Sig Dispense Refill  . acyclovir ointment (ZOVIRAX) 5 % USE EVERY 4 TO 6 HOURS AS NEEDED FOR COLD SORES 15 g 2  . albuterol (PROVENTIL) (2.5 MG/3ML) 0.083% nebulizer solution Take 3 mLs (2.5 mg total) by nebulization every 6 (six) hours as needed for wheezing. Please place on filer for patient. 75 mL 12  . budesonide-formoterol (SYMBICORT) 160-4.5 MCG/ACT inhaler Inhale 2 puffs into the lungs 2 (two) times daily. 1 Inhaler 12  . budesonide-formoterol (SYMBICORT) 160-4.5 MCG/ACT inhaler Inhale 2 puffs 2 (two)  times daily into the lungs. 1 Inhaler 0  . fluticasone (FLONASE) 50 MCG/ACT nasal spray Place 2 sprays into both nostrils daily as needed for allergies or rhinitis.    Marland Kitchen ipratropium (ATROVENT) 0.02 % nebulizer solution Take 2.5 mLs (0.5 mg total) by nebulization 4 (four) times daily. 75 mL 12  . montelukast (SINGULAIR) 10 MG tablet Take 1 tablet (10 mg total) by mouth at bedtime. 90 tablet 3  . NON FORMULARY Stool softeners take daily due to hemorrhoids    . nystatin (MYCOSTATIN) 100000 UNIT/ML suspension Take 5 mLs (500,000 Units total) by mouth 4 (four) times daily. Nystatin swish and swallow. 60 mL 0  . pantoprazole (PROTONIX) 40 MG tablet Take 1 tablet (40 mg total) by mouth daily. Always take on an empty stomach in the morning and eat something 30 minutes after taking. 90 tablet 3  . Peak Flow Meter (AIRZONE PEAK FLOW METER) DEVI Please use daily to keep track of your breathing. 1 each 0  . SUMAtriptan (IMITREX) 100 MG tablet TAKE ONE-HALF TO ONE TABLET BY MOUTH AS NEEDED FOR  MIGRAINE.  MAX  200  MG  IN  24  HOURS 27 tablet 1  . topiramate (TOPAMAX) 50 MG tablet Take 1 tablet (50 mg total) by mouth at bedtime. (Patient taking differently: Take 50 mg by mouth daily as needed (for cluster migraine). ) 30 tablet 11  . traZODone (DESYREL) 50 MG tablet Take 1-2 tablets (50-100 mg total) by mouth at bedtime as needed for sleep. 180 tablet 3   No  current facility-administered medications on file prior to visit.     No Known Allergies   Objective:  BP (!) 148/103   Pulse 92   Temp 98.1 F (36.7 C) (Oral)   Resp 16   Ht 5\' 11"  (1.803 m)   Wt 271 lb 6.4 oz (123.1 kg)   SpO2 94%   BMI 37.85 kg/m   Physical Exam  Constitutional: He is oriented to person, place, and time and well-developed, well-nourished, and in no distress. No distress.  Cardiovascular: Normal rate, regular rhythm and normal heart sounds.  Pulmonary/Chest: Effort normal. No tachypnea. No respiratory distress. He has  wheezes in the right upper field, the right middle field, the right lower field, the left upper field, the left middle field and the left lower field. He has rhonchi in the right lower field and the left lower field.  Neurological: He is alert and oriented to person, place, and time. GCS score is 15.  Skin: Skin is warm and dry.  Psychiatric: Mood, memory, affect and judgment normal.  Vitals reviewed.   Assessment and Plan :  1. COPD exacerbation ([place]) - albuterol (PROVENTIL) (2.5 MG/3ML) 0.083% nebulizer solution 2.5 mg - ipratropium (ATROVENT) nebulizer solution 0.5 mg - methylPREDNISolone sodium succinate (SOLU-MEDROL) 125 mg/2 mL injection 125 mg - predniSONE (DELTASONE) 20 MG tablet; Take 2 tablets (40 mg total) daily with breakfast for 10 days by mouth.  Dispense: 20 tablet; Refill: 0 - amoxicillin-clavulanate (AUGMENTIN) 875-125 MG tablet; Take 1 tablet 2 (two) times daily for 7 days by mouth.  Dispense: 14 tablet; Refill: 0 - albuterol (PROVENTIL) (2.5 MG/3ML) 0.083% nebulizer solution 5 mg - ipratropium (ATROVENT) nebulizer solution 0.5 mg - Pt presents with COPD exacerbation after irritant exposure at work. Pt feels much better after 2 rounds of duo neb breathing treatment. Plan to con't treatment with prednisone. OK to refill medication. F/u as needed. Con't f/u with pulm. Wear mask at work.  2. Severe persistent chronic asthma without complication - umeclidinium bromide (INCRUSE ELLIPTA) 62.5 MCG/INH AEPB; Inhale 1 puff daily into the lungs.  Dispense: 30 each; Refill: 9   Whitney Tykerria Mccubbins, PA-C  [facility] 02/09/2017 10:24 AM

## 2017-02-09 NOTE — Patient Instructions (Addendum)
  Stay well hydrated and take your prednisone and antibiotic.  Come back if you are not improving in 5-7 days.   Thank you for coming in today. I hope you feel we met your needs.  Feel free to call PCP if you have any questions or further requests.  Please consider signing up for MyChart if you do not already have it, as this is a great way to communicate with me.  Best,  Whitney McVey, PA-C  IF you received an x-ray today, you will receive an invoice from [facility]. Please contact [facility] at [phone] with questions or concerns regarding your invoice.   IF you received labwork today, you will receive an invoice from [facility]. Please contact [facility] at [phone] with questions or concerns regarding your invoice.   Our billing staff will not be able to assist you with questions regarding bills from these companies.  You will be contacted with the lab results as soon as they are available. The fastest way to get your results is to activate your My Chart account. Instructions are located on the last page of this paperwork. If you have not heard from [place] regarding the results in 2 weeks, please contact this office.

## 2017-02-10 ENCOUNTER — Telehealth: Payer: Self-pay | Admitting: Neurology

## 2017-02-10 ENCOUNTER — Encounter: Payer: Self-pay | Admitting: Gastroenterology

## 2017-02-10 DIAGNOSIS — G4734 Idiopathic sleep related nonobstructive alveolar hypoventilation: Secondary | ICD-10-CM

## 2017-02-10 DIAGNOSIS — G4733 Obstructive sleep apnea (adult) (pediatric): Secondary | ICD-10-CM

## 2017-02-10 NOTE — Procedures (Signed)
  [facility] [address] [place], [place] [address] NAME:  Derek Pacheco                                                           DOB: [birth year] MEDICAL RECORD NUMBER     [id number]                               DOS: 02/08/17 REFERRING PHYSICIAN: Philis Fendt, PA-C  STUDY PERFORMED: Home Sleep Study  HISTORY: 51 year old man with a history of asthma, bronchitis, migraine headaches, elevated blood pressure values and obesity, who reports non-restorative sleep, and daytime tiredness.  His Epworth sleepiness score is about 9 out of 24. BMI of 37.2.   STUDY RESULTS: Total Recording (total valid test time):  5 hours, 26 minutes Total Apnea/Hypopnea Index (AHI): 36.6/Hour Average Oxygen Saturation:   89% Lowest Oxygen Saturation:    76%  Time Oxygen Saturation Below or at 88%: 145 minutes (39%) Average Heart Rate:     69  BPM IMPRESSION: OSA, Nocturnal hypoxemia RECOMMENDATION: This home sleep test demonstrates evidence of severe obstructive sleep apnea and nocturnal hypoxemia, with a total AHI of 36.6/hour and O2 nadir of 76%. Given the patient's medical history and sleep related complaints, treatment with positive airway pressure (in the form of CPAP) is recommended. This will require a full night CPAP titration study for proper treatment settings and mask fitting. The patient should be cautioned not to drive, work at heights, or operate dangerous or heavy equipment when tired or sleepy. Review and reiteration of good sleep hygiene measures should be pursued with any patient. The patient and his referring provider will be notified of the test results. The patient will be seen in follow up in sleep clinic at [facility].  I certify that I have reviewed the raw data recording prior to the issuance of this report in accordance with the standards of the American Academy of Sleep Medicine (AASM).  Star Age, MD, PhD [facility]) Diplomat, ABPN  (neurology and sleep)

## 2017-02-10 NOTE — Telephone Encounter (Signed)
I called pt to discuss his sleep study results. No answer, left a message asking him to call me back. 

## 2017-02-10 NOTE — Telephone Encounter (Signed)
Pt returned RN's call °

## 2017-02-10 NOTE — Telephone Encounter (Signed)
Patient referred by Philis Fendt, seen by me on 12/30/16, HST on 02/08/17:  Please call and notify the patient that the recent home sleep test did suggest the diagnosis of severe obstructive sleep apnea and that I recommend treatment for this in the form of CPAP. I will request an overnight sleep study for proper titration and mask fitting. Please explain to patient and arrange for a CPAP titration study. I have placed an order in the chart.   Star Age, MD, PhD [facility])

## 2017-02-11 NOTE — Telephone Encounter (Signed)

## 2017-02-15 ENCOUNTER — Telehealth: Payer: Self-pay | Admitting: Gastroenterology

## 2017-02-15 NOTE — Telephone Encounter (Signed)
Dr Loletha Carrow,  Pt called states he has been diagnosed with severe sleep apnea and COPD today .   He is going to have a sleep study 12-23 and then will get cpap- he states he has had sleep apnea for awhile - he had knee surgery last December with no issues -  He isn't sure what type of sedation he had but did fine. He wants to make sure he is ok for colon here in the [place]. He is scheduled for 11-26 Monday morning. He states his breathing is fine currently. Is it ok to proceed with colon.   Please advise, thanks for your time     Marijean Niemann

## 2017-02-15 NOTE — Progress Notes (Signed)
I have spoken with him to reiterate the importance of compliance and follow up and he is 100% on board with treatment.  Thank you for uncovering this pathology.  Philis Fendt, MS, PA-C 11:09 AM, 02/15/2017

## 2017-02-16 NOTE — Telephone Encounter (Signed)
Spoke with patient- gave him Dr Loletha Carrow' recommendations- We RS PV and colon into January at pt's request. Mailed AVS with new dates and times for PV and colon- pt very understandable about change and reason. Cancelled colon for Monday   [facility]

## 2017-02-16 NOTE — Telephone Encounter (Signed)
I just reviewed his pulmonary clinic note from 11/14, when they gave him steroids and antibiotics.  Given that, next week is not a good time for routine screening colonoscopy, as he would be at increased sedation risk with recent flare of his respiratory condition.  The procedure should be moved back a few weeks.  He can also go after first of the year if he prefers.

## 2017-02-21 ENCOUNTER — Encounter: Payer: BLUE CROSS/BLUE SHIELD | Admitting: Gastroenterology

## 2017-03-02 ENCOUNTER — Other Ambulatory Visit: Payer: Self-pay | Admitting: Physician Assistant

## 2017-03-02 DIAGNOSIS — G43109 Migraine with aura, not intractable, without status migrainosus: Secondary | ICD-10-CM

## 2017-03-03 ENCOUNTER — Other Ambulatory Visit: Payer: Self-pay

## 2017-03-03 DIAGNOSIS — G43109 Migraine with aura, not intractable, without status migrainosus: Secondary | ICD-10-CM

## 2017-03-03 MED ORDER — SUMATRIPTAN SUCCINATE 100 MG PO TABS: ORAL_TABLET | ORAL | 1 refills | Status: DC

## 2017-03-03 NOTE — Telephone Encounter (Signed)
Imitrex Rx

## 2017-03-20 ENCOUNTER — Ambulatory Visit (INDEPENDENT_AMBULATORY_CARE_PROVIDER_SITE_OTHER): Payer: BLUE CROSS/BLUE SHIELD | Admitting: Neurology

## 2017-03-20 ENCOUNTER — Other Ambulatory Visit: Payer: Self-pay | Admitting: Physician Assistant

## 2017-03-20 DIAGNOSIS — G4761 Periodic limb movement disorder: Secondary | ICD-10-CM

## 2017-03-20 DIAGNOSIS — G4733 Obstructive sleep apnea (adult) (pediatric): Secondary | ICD-10-CM | POA: Diagnosis not present

## 2017-03-20 DIAGNOSIS — G4734 Idiopathic sleep related nonobstructive alveolar hypoventilation: Secondary | ICD-10-CM

## 2017-04-04 ENCOUNTER — Ambulatory Visit: Payer: BLUE CROSS/BLUE SHIELD | Admitting: Physician Assistant

## 2017-04-04 ENCOUNTER — Encounter: Payer: Self-pay | Admitting: Physician Assistant

## 2017-04-04 ENCOUNTER — Other Ambulatory Visit: Payer: Self-pay

## 2017-04-04 VITALS — BP 148/90 | HR 105 | Temp 97.8°F | Resp 18 | Ht 71.0 in | Wt 278.8 lb

## 2017-04-04 DIAGNOSIS — J449 Chronic obstructive pulmonary disease, unspecified: Secondary | ICD-10-CM | POA: Diagnosis not present

## 2017-04-04 DIAGNOSIS — R062 Wheezing: Secondary | ICD-10-CM

## 2017-04-04 MED ORDER — AZITHROMYCIN 250 MG PO TABS: ORAL_TABLET | ORAL | 0 refills | Status: AC

## 2017-04-04 MED ORDER — ALBUTEROL SULFATE (2.5 MG/3ML) 0.083% IN NEBU
2.5000 mg | INHALATION_SOLUTION | Freq: Once | RESPIRATORY_TRACT | Status: AC
  Administered 2017-04-04: 2.5 mg via RESPIRATORY_TRACT

## 2017-04-04 MED ORDER — IPRATROPIUM BROMIDE 0.02 % IN SOLN: 0.5000 mg | Freq: Four times a day (QID) | RESPIRATORY_TRACT | 18 refills | Status: DC

## 2017-04-04 MED ORDER — IPRATROPIUM BROMIDE 0.02 % IN SOLN
0.5000 mg | Freq: Once | RESPIRATORY_TRACT | Status: AC
  Administered 2017-04-04: 0.5 mg via RESPIRATORY_TRACT

## 2017-04-04 MED ORDER — PREDNISONE 20 MG PO TABS: ORAL_TABLET | ORAL | 0 refills | Status: AC

## 2017-04-04 NOTE — Progress Notes (Signed)
04/04/2017 12:34 PM   DOB: 08/26/1965 / MRN: [id number]  SUBJECTIVE:  Derek Pacheco is a 52 y.o. male presenting for wheezing and chest tightness.  He tells me that he had a cold all last week and was doing well until yesterday when he began to notice the above symptoms.  He has had the flu shot and his pneumo 23 documented below.  Fortunately his peak flow remains preserved today.    Immunization History  Administered Date(s) Administered  . Influenza,inj,Quad PF,6+ Mos 12/12/2015, 12/17/2016  . Pneumococcal Polysaccharide-23 02/24/2016  . Tdap 05/31/2012     He has No Known Allergies.   He  has a past medical history of Allergy, Asthma, Bronchitis, GERD (gastroesophageal reflux disease), Insomnia, and Migraines.    He  reports that he quit smoking about 11 years ago. His smoking use included cigarettes. He has a 25.00 pack-year smoking history. he has never used smokeless tobacco. He reports that he drinks alcohol. He reports that he does not use drugs. He  reports that he currently engages in sexual activity. He reports using the following method of birth control/protection: None. The patient  has a past surgical history that includes Knee surgery (Right).  His family history includes Asthma in his mother.  Review of Systems  Constitutional: Negative for chills, diaphoresis and fever.  Eyes: Negative.   Respiratory: Positive for cough and wheezing. Negative for hemoptysis, sputum production and shortness of breath.   Cardiovascular: Negative for chest pain, orthopnea and leg swelling.  Gastrointestinal: Negative for nausea.  Skin: Negative for rash.  Neurological: Negative for dizziness, sensory change, speech change, focal weakness and headaches.    The problem list and medications were reviewed and updated by myself where necessary and exist elsewhere in the encounter.   OBJECTIVE:  BP (!) 148/90   Pulse (!) 105   Temp 97.8 F (36.6 C) (Oral)   Resp 18   Ht 5\' 11"   (1.803 m)   Wt 278 lb 12.8 oz (126.5 kg)   SpO2 96%   BMI 38.88 kg/m   Wt Readings from Last 3 Encounters:  04/04/17 278 lb 12.8 oz (126.5 kg)  02/09/17 271 lb 6.4 oz (123.1 kg)  02/07/17 276 lb (125.2 kg)   Temp Readings from Last 3 Encounters:  04/04/17 97.8 F (36.6 C) (Oral)  02/09/17 98.1 F (36.7 C) (Oral)  12/17/16 98 F (36.7 C) (Oral)   BP Readings from Last 3 Encounters:  04/04/17 (!) 148/90  02/09/17 (!) 148/103  02/07/17 138/78   Pulse Readings from Last 3 Encounters:  04/04/17 (!) 105  02/09/17 92  02/07/17 92     Physical Exam  Constitutional: He appears well-developed. He is active and cooperative.  Non-toxic appearance.  Cardiovascular: Normal rate, regular rhythm, S1 normal, S2 normal, normal heart sounds, intact distal pulses and normal pulses. Exam reveals no gallop and no friction rub.  No murmur heard. Pulmonary/Chest: Effort normal. No stridor. No tachypnea. No respiratory distress. He has wheezes. He has no rales. He exhibits no tenderness.  Abdominal: He exhibits no distension.  Musculoskeletal: He exhibits no edema.  Neurological: He is alert.  Skin: Skin is warm and dry. He is not diaphoretic. No pallor.  Vitals reviewed.   No results found for this or any previous visit (from the past 72 hour(s)).  No results found.  ASSESSMENT AND PLAN:  Marlin was seen today for shortness of breath.  Diagnoses and all orders for this visit:  Wheezing -  ipratropium (ATROVENT) 0.02 % nebulizer solution; Take 2.5 mLs (0.5 mg total) by nebulization 4 (four) times daily. -     albuterol (PROVENTIL) (2.5 MG/3ML) 0.083% nebulizer solution 2.5 mg -     ipratropium (ATROVENT) nebulizer solution 0.5 mg  Stage 2 moderate COPD by GOLD classification (HCC) -     predniSONE (DELTASONE) 20 MG tablet; Take 3 in the morning for 3 days, then 2 in the morning for 3 days, and then 1 in the morning for 3 days. -     azithromycin (ZITHROMAX) 250 MG tablet; Take  2 tabs PO x 1 dose, then 1 tab PO QD x 4 days    The patient is advised to call or return to clinic if he does not see an improvement in symptoms, or to seek the care of the closest emergency department if he worsens with the above plan.   Philis Fendt, MHS, PA-C [facility] 04/04/2017 12:34 PM

## 2017-04-04 NOTE — Patient Instructions (Addendum)
I would like to see you back about one month or so after you start using the CPAP machine.  Please bring your printout with you so I can take a look at it.  If you need any medications in the mean time please call and put in a message at [phone].  If you feel worse tomorrow after taking meds please come back in so I can look at you again.      IF you received an x-ray today, you will receive an invoice from [facility]. Please contact [facility] at [phone] with questions or concerns regarding your invoice.   IF you received labwork today, you will receive an invoice from [facility]. Please contact [facility] at [phone] with questions or concerns regarding your invoice.   Our billing staff will not be able to assist you with questions regarding bills from these companies.  You will be contacted with the lab results as soon as they are available. The fastest way to get your results is to activate your My Chart account. Instructions are located on the last page of this paperwork. If you have not heard from [place] regarding the results in 2 weeks, please contact this office.

## 2017-04-05 ENCOUNTER — Other Ambulatory Visit: Payer: Self-pay | Admitting: Neurology

## 2017-04-05 ENCOUNTER — Telehealth: Payer: Self-pay

## 2017-04-05 DIAGNOSIS — G4733 Obstructive sleep apnea (adult) (pediatric): Secondary | ICD-10-CM

## 2017-04-05 NOTE — Procedures (Signed)
PATIENT'S NAME:  Derek Pacheco, Derek Pacheco DOB:      Sep 15, 1965      MR#:    [id number]     DATE OF RECORDING: 03/20/2017 REFERRING M.D.:  Philis Fendt, PA-C Study Performed:   CPAP  Titration HISTORY:  52 year old man with a history of asthma, bronchitis, migraine headaches, elevated blood pressure values and obesity, who presents for a CPAP titration. He had a home sleep test on 02/08/17, which showed severe obstructive sleep apnea and nocturnal hypoxemia, with a total AHI of 36.6/hour and O2 nadir of 76%. The patient endorsed the Epworth Sleepiness Scale at 9 points. The patient's weight 282 pounds with a height of 72 (inches), resulting in a BMI of 38.2 kg/m2. The patient's neck circumference measured 18.8 inches.  CURRENT MEDICATIONS: Zovirax, Proventil inhaler, Symbicort, Flonase, Atrovent neb, Singulair, Nystatin, Protonix, Airzone, Imitrex, Topamax, Desyrel   PROCEDURE:  This is a multichannel digital polysomnogram utilizing the SomnoStar 11.2 system.  Electrodes and sensors were applied and monitored per AASM Specifications.   EEG, EOG, Chin and Limb EMG, were sampled at 200 Hz.  ECG, Snore and Nasal Pressure, Thermal Airflow, Respiratory Effort, CPAP Flow and Pressure, Oximetry was sampled at 50 Hz. Digital video and audio were recorded.      The patient was fitted with a medium Eson 2 nasal mask after trying nasal pillows. CPAP was initiated at 5 cmH20 with heated humidity per AASM standards and pressure was advanced to 6cmH20 because of hypopneas, apneas and desaturations.  At a PAP pressure of 6 cmH20, there was a reduction of the AHI to .3 with non-supine REM sleep achieved and O2 nadir of 90%.    Lights Out was at 22:36 and Lights On at 05:02. Total recording time (TRT) was 386 minutes, with a total sleep time (TST) of 351 minutes. The patient's sleep latency was 20.5 minutes, REM latency was 106 minutes.  The sleep efficiency was 90.9 %.    SLEEP ARCHITECTURE: WASO (Wake after sleep onset)  was 19.5 minutes with mild sleep fragmentation noted in the beginning of the study. There were 5.5 minutes in Stage N1, 153.5 minutes Stage N2, 126.5 minutes Stage N3 and 65.5 minutes in Stage REM.  The percentage of Stage N1 was 1.6%, Stage N2 was 43.7%, Stage N3 was 36.%, which is increased, and Stage R (REM sleep) was 18.7%, which is near normal. The arousals were noted as: 31 were spontaneous, 11 were associated with PLMs, 1 were associated with respiratory events.  Audio and video analysis did not show any abnormal or unusual movements, behaviors, phonations or vocalizations. The patient took no bathroom breaks. The EKG was in keeping with normal sinus rhythm (NSR).  RESPIRATORY ANALYSIS:  There was a total of 2 respiratory events: 0 obstructive apneas, 1 central apneas and 0 mixed apneas with a total of 1 apneas and an apnea index (AI) of .2 /hour. There were 1 hypopneas with a hypopnea index of .2/hour. The patient also had 0 respiratory event related arousals (RERAs).      The total APNEA/HYPOPNEA INDEX  (AHI) was .3 /hour and the total RESPIRATORY DISTURBANCE INDEX was .3 .hour  1 events occurred in REM sleep and 1 events in NREM. The REM AHI was .9 /hour versus a non-REM AHI of .2 /hour.  The patient spent 0 minutes of total sleep time in the supine position and 351 minutes in non-supine. The supine AHI was 0.0, versus a non-supine AHI of 0.4.  OXYGEN SATURATION & C02:  The baseline 02 saturation was 92%, with the lowest being 86%. Time spent below 89% saturation equaled 5 minutes.  PERIODIC LIMB MOVEMENTS: The patient had a total of 117 Periodic Limb Movements. The Periodic Limb Movement (PLM) index was 20. and the PLM Arousal index was 1.9 /hour.  Post-study, the patient indicated that sleep was better than usual.   DIAGNOSIS 1. Obstructive Sleep Apnea  2. Periodic Limb Movement Disorder   PLANS/RECOMMENDATIONS:  1. This study demonstrates resolution of the patient's obstructive sleep  apnea with CPAP therapy. Due to non-supine REM sleep achieved on the final titration pressure of 6 cm, I recommend a home CPAP pressure of 7 cm via medium nasal mask. The patient should be reminded to be fully compliant with PAP therapy to improve sleep related symptoms and decrease long term cardiovascular risks. The patient should be reminded, that it may take up to 3 months to get fully used to using PAP with all planned sleep. The earlier full compliance is achieved, the better long term compliance tends to be. Please note that untreated obstructive sleep apnea carries additional perioperative morbidity. Patients with significant obstructive sleep apnea should receive perioperative PAP therapy and the surgeons and particularly the anesthesiologist should be informed of the diagnosis and the severity of the sleep disordered breathing. 2. Mild PLMs (periodic limb movements of sleep) were noted during this study with no significant arousals; clinical correlation is recommended.  3. The patient should be cautioned not to drive, work at heights, or operate dangerous or heavy equipment when tired or sleepy. Review and reiteration of good sleep hygiene measures should be pursued with any patient. 4. The patient will be seen in follow-up by Dr. Rexene Alberts at [facility] for discussion of the test results and further management strategies. The referring provider will be notified of the test results.  I certify that I have reviewed the entire raw data recording prior to the issuance of this report in accordance with the Standards of Accreditation of the American Academy of Sleep Medicine (AASM)   Star Age, MD, PhD Diplomat, American Board of Psychiatry and Neurology (Neurology and Sleep Medicine)

## 2017-04-05 NOTE — Telephone Encounter (Signed)
-----   Message from Star Age, MD sent at 04/05/2017  8:11 AM EST ----- Pt seen on 12/30/16. Pt had HST on 02/08/17, which showed severe OSA. CPAP titration on 03/20/17.  Please call and inform patient that I have entered an order for treatment with positive airway pressure (PAP) treatment of obstructive sleep apnea (OSA). He did well during the latest sleep study with CPAP. We will, therefore, arrange for a machine for home use through a DME (durable medical equipment) company of His choice; and I will see the patient back in follow-up in about 10 weeks. Please also explain to the patient that I will be looking out for compliance data, which can be downloaded from the machine (stored on an SD card, that is inserted in the machine) or via remote access through a modem, that is built into the machine. At the time of the followup appointment we will discuss sleep study results and how it is going with PAP treatment at home. Please advise patient to bring His machine at the time of the first FU visit, even though this is cumbersome. Bringing the machine for every visit after that will likely not be needed, but often helps for the first visit to troubleshoot if needed. Please re-enforce the importance of compliance with treatment and the need for [place] to monitor compliance data - often an insurance requirement and actually good feedback for the patient as far as how they are doing.  Also remind patient, that any interim PAP machine or mask issues should be first addressed with the DME company, as they can often help better with technical and mask fit issues. Please ask if patient has a preference regarding DME company.  Please also make sure, the patient has a follow-up appointment with me or NP in about 10 weeks from the setup date, thanks.  Once you have spoken to the patient - and faxed/routed report to PCP and referring MD (if other than PCP), you can close this encounter, thanks,   Star Age, MD,  PhD [facility] ([place])

## 2017-04-05 NOTE — Progress Notes (Signed)
Pt seen on 12/30/16. Pt had HST on 02/08/17, which showed severe OSA. CPAP titration on 03/20/17.  Please call and inform patient that I have entered an order for treatment with positive airway pressure (PAP) treatment of obstructive sleep apnea (OSA). He did well during the latest sleep study with CPAP. We will, therefore, arrange for a machine for home use through a DME (durable medical equipment) company of His choice; and I will see the patient back in follow-up in about 10 weeks. Please also explain to the patient that I will be looking out for compliance data, which can be downloaded from the machine (stored on an SD card, that is inserted in the machine) or via remote access through a modem, that is built into the machine. At the time of the followup appointment we will discuss sleep study results and how it is going with PAP treatment at home. Please advise patient to bring His machine at the time of the first FU visit, even though this is cumbersome. Bringing the machine for every visit after that will likely not be needed, but often helps for the first visit to troubleshoot if needed. Please re-enforce the importance of compliance with treatment and the need for [place] to monitor compliance data - often an insurance requirement and actually good feedback for the patient as far as how they are doing.  Also remind patient, that any interim PAP machine or mask issues should be first addressed with the DME company, as they can often help better with technical and mask fit issues. Please ask if patient has a preference regarding DME company.  Please also make sure, the patient has a follow-up appointment with me or NP in about 10 weeks from the setup date, thanks.  Once you have spoken to the patient - and faxed/routed report to PCP and referring MD (if other than PCP), you can close this encounter, thanks,   Star Age, MD, PhD [facility] ([place])

## 2017-04-05 NOTE — Telephone Encounter (Signed)
I called pt. I advised pt that Dr. Rexene Alberts reviewed their sleep study results and found that pt did well with the cpap. Dr. Rexene Alberts recommends that pt start a cpap at home. I reviewed PAP compliance expectations with the pt. Pt is agreeable to starting a CPAP. I advised pt that an order will be sent to a DME, [facility], and [employer] will call the pt within about one week after they file with the pt's insurance. [facility] will show the pt how to use the machine, fit for masks, and troubleshoot the CPAP if needed. A follow up appt was made for insurance purposes with Dr. Rexene Alberts on 06/15/17 at 2:00pm. Pt verbalized understanding to arrive 15 minutes early and bring their CPAP. A letter with all of this information in it will be mailed to the pt as a reminder. I verified with the pt that the address we have on file is correct. Pt verbalized understanding of results. Pt had no questions at this time but was encouraged to call back if questions arise.

## 2017-04-14 ENCOUNTER — Other Ambulatory Visit: Payer: Self-pay

## 2017-04-14 ENCOUNTER — Encounter: Payer: Self-pay | Admitting: Gastroenterology

## 2017-04-14 ENCOUNTER — Ambulatory Visit (AMBULATORY_SURGERY_CENTER): Payer: Self-pay

## 2017-04-14 VITALS — Ht 72.0 in | Wt 290.0 lb

## 2017-04-14 DIAGNOSIS — Z1211 Encounter for screening for malignant neoplasm of colon: Secondary | ICD-10-CM

## 2017-04-14 MED ORDER — NA SULFATE-K SULFATE-MG SULF 17.5-3.13-1.6 GM/177ML PO SOLN: 1.0000 | Freq: Once | ORAL | 0 refills | Status: AC

## 2017-04-14 NOTE — Progress Notes (Signed)
Denies allergies to eggs or soy products. Denies complication of anesthesia or sedation. Denies use of weight loss medication. Denies use of O2.   Emmi instructions declined.  

## 2017-04-15 DIAGNOSIS — G4733 Obstructive sleep apnea (adult) (pediatric): Secondary | ICD-10-CM | POA: Diagnosis not present

## 2017-04-18 ENCOUNTER — Ambulatory Visit: Payer: BLUE CROSS/BLUE SHIELD | Admitting: Physician Assistant

## 2017-04-25 ENCOUNTER — Ambulatory Visit (AMBULATORY_SURGERY_CENTER): Payer: BLUE CROSS/BLUE SHIELD | Admitting: Gastroenterology

## 2017-04-25 ENCOUNTER — Other Ambulatory Visit: Payer: Self-pay

## 2017-04-25 ENCOUNTER — Encounter: Payer: Self-pay | Admitting: Gastroenterology

## 2017-04-25 VITALS — BP 133/94 | HR 82 | Temp 98.4°F | Resp 15 | Ht 72.0 in | Wt 290.0 lb

## 2017-04-25 DIAGNOSIS — Z1211 Encounter for screening for malignant neoplasm of colon: Secondary | ICD-10-CM | POA: Diagnosis not present

## 2017-04-25 DIAGNOSIS — D123 Benign neoplasm of transverse colon: Secondary | ICD-10-CM | POA: Diagnosis not present

## 2017-04-25 DIAGNOSIS — Z1212 Encounter for screening for malignant neoplasm of rectum: Secondary | ICD-10-CM

## 2017-04-25 MED ORDER — SODIUM CHLORIDE 0.9 % IV SOLN: 500.0000 mL | Freq: Once | INTRAVENOUS | Status: DC

## 2017-04-25 NOTE — Op Note (Signed)
[facility] Patient Name: Derek Pacheco Procedure Date: 04/25/2017 7:52 AM MRN: [id number] Endoscopist: Mallie Mussel L. Loletha Carrow , MD Age: 52 Referring MD:  Date of Birth: 1965-07-06 Gender: Male Account #: [id number] Procedure:                Colonoscopy Indications:              Screening for colorectal malignant neoplasm, This                            is the patient's first colonoscopy Medicines:                Monitored Anesthesia Care Procedure:                Pre-Anesthesia Assessment:                           - Prior to the procedure, a History and Physical                            was performed, and patient medications and                            allergies were reviewed. The patient's tolerance of                            previous anesthesia was also reviewed. The risks                            and benefits of the procedure and the sedation                            options and risks were discussed with the patient.                            All questions were answered, and informed consent                            was obtained. Prior Anticoagulants: The patient has                            taken no previous anticoagulant or antiplatelet                            agents. ASA Grade Assessment: II - A patient with                            mild systemic disease. After reviewing the risks                            and benefits, the patient was deemed in                            satisfactory condition to undergo the procedure.  After obtaining informed consent, the colonoscope                            was passed under direct vision. Throughout the                            procedure, the patient's blood pressure, pulse, and                            oxygen saturations were monitored continuously. The                            Colonoscope was introduced through the anus and                            advanced to the the cecum,  identified by                            appendiceal orifice and ileocecal valve. The                            colonoscopy was performed without difficulty. The                            patient tolerated the procedure well. The quality                            of the bowel preparation was excellent. The                            ileocecal valve, appendiceal orifice, and rectum                            were photographed. Scope In: 8:12:37 AM Scope Out: 8:28:32 AM Scope Withdrawal Time: 0 hours 9 minutes 31 seconds  Total Procedure Duration: 0 hours 15 minutes 55 seconds  Findings:                 The perianal and digital rectal examinations were                            normal.                           An area of melanosis was found in the ascending                            colon.                           Multiple small-mouthed diverticula were found in                            the left colon.  A 4 mm polyp was found in the transverse colon. The                            polyp was sessile. The polyp was removed with a                            cold snare. Resection and retrieval were complete.                           Internal hemorrhoids were found. The hemorrhoids                            were Grade I (internal hemorrhoids that do not                            prolapse).                           The exam was otherwise without abnormality on                            direct and retroflexion views. Complications:            No immediate complications. Estimated Blood Loss:     Estimated blood loss was minimal. Impression:               - Melanosis in the colon.                           - Diverticulosis in the left colon.                           - One 4 mm polyp in the transverse colon, removed                            with a cold snare. Resected and retrieved.                           - Internal hemorrhoids.                            - The examination was otherwise normal on direct                            and retroflexion views. Recommendation:           - Patient has a contact number available for                            emergencies. The signs and symptoms of potential                            delayed complications were discussed with the                            patient. Return to normal  activities tomorrow.                            Written discharge instructions were provided to the                            patient.                           - Resume previous diet.                           - Continue present medications.                           - Await pathology results.                           - Repeat colonoscopy is recommended for                            surveillance. The colonoscopy date will be                            determined after pathology results from today's                            exam become available for review. Henry L. Loletha Carrow, MD 04/25/2017 8:33:36 AM This report has been signed electronically.

## 2017-04-25 NOTE — Progress Notes (Signed)
Pt given handouts on [facility] hemorrhoid banding info.  Pt to call back if interested in banding to make an appointment. maw

## 2017-04-25 NOTE — Progress Notes (Signed)
Called to room to assist during endoscopic procedure.  Patient ID and intended procedure confirmed with present staff. Received instructions for my participation in the procedure from the performing physician.  

## 2017-04-25 NOTE — Progress Notes (Signed)
To recovery, report to RN, VSS. 

## 2017-04-25 NOTE — Progress Notes (Signed)
No problems noted in the recovery room. Maw  Inhaler given to pt's wife. maw

## 2017-04-25 NOTE — Patient Instructions (Addendum)
YOU HAD AN ENDOSCOPIC PROCEDURE TODAY AT [facility]:   Refer to the procedure report that was given to you for any specific questions about what was found during the examination.  If the procedure report does not answer your questions, please call your gastroenterologist to clarify.  If you requested that your care partner not be given the details of your procedure findings, then the procedure report has been included in a sealed envelope for you to review at your convenience later.  YOU SHOULD EXPECT: Some feelings of bloating in the abdomen. Passage of more gas than usual.  Walking can help get rid of the air that was put into your GI tract during the procedure and reduce the bloating. If you had a lower endoscopy (such as a colonoscopy or flexible sigmoidoscopy) you may notice spotting of blood in your stool or on the toilet paper. If you underwent a bowel prep for your procedure, you may not have a normal bowel movement for a few days.  Please Note:  You might notice some irritation and congestion in your nose or some drainage.  This is from the oxygen used during your procedure.  There is no need for concern and it should clear up in a day or so.  SYMPTOMS TO REPORT IMMEDIATELY:   Following lower endoscopy (colonoscopy or flexible sigmoidoscopy):  Excessive amounts of blood in the stool  Significant tenderness or worsening of abdominal pains  Swelling of the abdomen that is new, acute  Fever of 100F or higher   For urgent or emergent issues, a gastroenterologist can be reached at any hour by calling [phone].   DIET:  We do recommend a small meal at first, but then you may proceed to your regular diet.  Drink plenty of fluids but you should avoid alcoholic beverages for 24 hours.  ACTIVITY:  You should plan to take it easy for the rest of today and you should NOT DRIVE or use heavy machinery until tomorrow (because of the sedation medicines used during the test).     FOLLOW UP: Our staff will call the number listed on your records the next business day following your procedure to check on you and address any questions or concerns that you may have regarding the information given to you following your procedure. If we do not reach you, we will leave a message.  However, if you are feeling well and you are not experiencing any problems, there is no need to return our call.  We will assume that you have returned to your regular daily activities without incident.  If any biopsies were taken you will be contacted by phone or by letter within the next 1-3 weeks.  Please call us at [phone] if you have not heard about the biopsies in 3 weeks.    SIGNATURES/CONFIDENTIALITY: You and/or your care partner have signed paperwork which will be entered into your electronic medical record.  These signatures attest to the fact that that the information above on your After Visit Summary has been reviewed and is understood.  Full responsibility of the confidentiality of this discharge information lies with you and/or your care-partner.    Handouts were given to your care partner on polyps, diverticulosis, hemorrhoids,and [place] hemorrhoid banding. If you are interested in hemorrhoid banding, please call to make an appointment. You may resume your current medications today. Await biopsy results. Please call if any questions or concerns.

## 2017-04-25 NOTE — Progress Notes (Signed)
No changes in medical or surgical hx since PV per pt 

## 2017-04-26 ENCOUNTER — Telehealth: Payer: Self-pay

## 2017-04-26 NOTE — Telephone Encounter (Signed)
  Follow up Call-  Call back number 04/25/2017  Post procedure Call Back phone  # [phone]  Permission to leave phone message Yes  Some recent data might be hidden     Patient questions:  Do you have a fever, pain , or abdominal swelling? No. Pain Score  0 *  Have you tolerated food without any problems? Yes.    Have you been able to return to your normal activities? Yes.    Do you have any questions about your discharge instructions: Diet   No. Medications  No. Follow up visit  No.  Do you have questions or concerns about your Care? No.  Actions: * If pain score is 4 or above: No action needed, pain <4.

## 2017-04-29 ENCOUNTER — Encounter: Payer: Self-pay | Admitting: Gastroenterology

## 2017-04-29 ENCOUNTER — Ambulatory Visit: Payer: BLUE CROSS/BLUE SHIELD | Admitting: Physician Assistant

## 2017-04-29 ENCOUNTER — Encounter: Payer: Self-pay | Admitting: Physician Assistant

## 2017-04-29 ENCOUNTER — Other Ambulatory Visit: Payer: Self-pay

## 2017-04-29 VITALS — HR 96 | Temp 97.7°F | Resp 18 | Ht 72.0 in | Wt 276.4 lb

## 2017-04-29 DIAGNOSIS — J4551 Severe persistent asthma with (acute) exacerbation: Secondary | ICD-10-CM

## 2017-04-29 DIAGNOSIS — J4550 Severe persistent asthma, uncomplicated: Secondary | ICD-10-CM

## 2017-04-29 MED ORDER — ALBUTEROL SULFATE (2.5 MG/3ML) 0.083% IN NEBU
5.0000 mg | INHALATION_SOLUTION | Freq: Once | RESPIRATORY_TRACT | Status: AC
  Administered 2017-04-29: 5 mg via RESPIRATORY_TRACT

## 2017-04-29 MED ORDER — AZITHROMYCIN 250 MG PO TABS: ORAL_TABLET | ORAL | 0 refills | Status: DC

## 2017-04-29 MED ORDER — IPRATROPIUM BROMIDE 0.02 % IN SOLN
0.5000 mg | Freq: Once | RESPIRATORY_TRACT | Status: AC
  Administered 2017-04-29: 0.5 mg via RESPIRATORY_TRACT

## 2017-04-29 MED ORDER — PREDNISONE 20 MG PO TABS: ORAL_TABLET | ORAL | 0 refills | Status: AC

## 2017-04-29 MED ORDER — METHYLPREDNISOLONE SODIUM SUCC 125 MG IJ SOLR
125.0000 mg | Freq: Once | INTRAMUSCULAR | Status: AC
  Administered 2017-04-29: 125 mg via INTRAMUSCULAR

## 2017-04-29 NOTE — Progress Notes (Signed)
04/29/2017 6:19 PM   DOB: Jun 02, 1965 / MRN: [id number]  SUBJECTIVE:  Derek Pacheco is a 52 y.o. male presenting for asthma/copd flare. He has had three exacerbations in the last year.  He is back today with a peak flow of 250.  Symptoms started abruptly yesterday after breathing and some sawdust.  States that almost immediately he began to have shortness of breath.  He has tried several nebulizers at home and does not feel that this is helping him greatly.  He has No Known Allergies.   He  has a past medical history of Allergy, Asthma, Bronchitis, GERD (gastroesophageal reflux disease), Insomnia, Migraines, and Sleep apnea.    He  reports that he quit smoking about 11 years ago. His smoking use included cigarettes. He has a 25.00 pack-year smoking history. he has never used smokeless tobacco. He reports that he drinks alcohol. He reports that he does not use drugs. He  reports that he currently engages in sexual activity. He reports using the following method of birth control/protection: None. The patient  has a past surgical history that includes Knee surgery (Right).  His family history includes Asthma in his mother.  Review of Systems  Constitutional: Negative for chills, diaphoresis and fever.  Eyes: Negative.   Respiratory: Negative for cough, hemoptysis, sputum production, shortness of breath and wheezing.   Cardiovascular: Negative for chest pain, orthopnea and leg swelling.  Gastrointestinal: Negative for nausea.  Skin: Negative for rash.  Neurological: Negative for dizziness, sensory change, speech change, focal weakness and headaches.    The problem list and medications were reviewed and updated by myself where necessary and exist elsewhere in the encounter.   OBJECTIVE:  Pulse 96   Temp 97.7 F (36.5 C) (Oral)   Resp 18   Ht 6' (1.829 m)   Wt 276 lb 6.4 oz (125.4 kg)   SpO2 93%   BMI 37.49 kg/m   Physical Exam  Constitutional: He appears well-developed. He is  active and cooperative.  Non-toxic appearance.  Cardiovascular: Normal rate, regular rhythm, S1 normal, S2 normal, normal heart sounds, intact distal pulses and normal pulses. Exam reveals no gallop and no friction rub.  No murmur heard. Pulmonary/Chest: Effort normal. No stridor. No tachypnea. No respiratory distress. He has no wheezes. He has no rales.  Abdominal: He exhibits no distension.  Musculoskeletal: He exhibits no edema.  Neurological: He is alert.  Skin: Skin is warm and dry. He is not diaphoretic. No pallor.  Vitals reviewed.   No results found for this or any previous visit (from the past 72 hour(s)).  No results found.  ASSESSMENT AND PLAN:  Jimi was seen today for wheezing.  Diagnoses and all orders for this visit:  Uncontrolled severe persistent allergic asthma: He is here again with the same symptoms and remains uncontrolled.  He has been to pulmonology.  He is compliant with medications.  I will check his respiratory IgE in the hopes that I can potentially treat his symptoms with Xolair. -     Allergens w/Total IgE Area 2 -     methylPREDNISolone sodium succinate (SOLU-MEDROL) 125 mg/2 mL injection 125 mg -     albuterol (PROVENTIL) (2.5 MG/3ML) 0.083% nebulizer solution 5 mg -     ipratropium (ATROVENT) nebulizer solution 0.5 mg  Other orders -     predniSONE (DELTASONE) 20 MG tablet; Take 3 in the morning for 3 days, then 2 in the morning for 3 days, and then 1 in  the morning for 3 days. -     azithromycin (ZITHROMAX) 250 MG tablet; Take two tabs on day one and one daily thereafter    The patient is advised to call or return to clinic if he does not see an improvement in symptoms, or to seek the care of the closest emergency department if he worsens with the above plan.   Philis Fendt, MHS, PA-C [facility] 04/29/2017 6:19 PM

## 2017-04-29 NOTE — Patient Instructions (Signed)
     IF you received an x-ray today, you will receive an invoice from [facility]. Please contact [facility] at [phone] with questions or concerns regarding your invoice.   IF you received labwork today, you will receive an invoice from [facility]. Please contact [facility] at [phone] with questions or concerns regarding your invoice.   Our billing staff will not be able to assist you with questions regarding bills from these companies.  You will be contacted with the lab results as soon as they are available. The fastest way to get your results is to activate your My Chart account. Instructions are located on the last page of this paperwork. If you have not heard from us regarding the results in 2 weeks, please contact this office.     

## 2017-05-03 LAB — ALLERGENS W/TOTAL IGE AREA 2
Aspergillus Fumigatus IgE: <0.1 kU/L
Cat Dander IgE: <0.1 kU/L
Cedar, Mountain IgE: <0.1 kU/L
Cockroach, German IgE: <0.1 kU/L
Common Silver Birch IgE: <0.1 kU/L
D Farinae IgE: <0.1 kU/L
D Pteronyssinus IgE: <0.1 kU/L
Dog Dander IgE: <0.1 kU/L
Elm, American IgE: <0.1 kU/L
IgE (Immunoglobulin E), Serum: 80 IU/mL (ref 0–100)
Mouse Urine IgE: <0.1 kU/L
Oak, White IgE: <0.1 kU/L
Ragweed, Short IgE: <0.1 kU/L

## 2017-05-04 NOTE — Progress Notes (Signed)
Please give him a call.  His allergy test is negative.  I am reaching out to a respiratory therapist colleague to look at his medications to ensure that we are doing everything right.

## 2017-05-12 ENCOUNTER — Telehealth: Payer: Self-pay | Admitting: Physician Assistant

## 2017-05-12 NOTE — Telephone Encounter (Signed)
Called and talked to pt about rescheduling his appt with Carlis Abbott for tomorrow 05/13/17. He would rather wait and see Clark. Someone will need to call him when we know when Carlis Abbott will be back in and make Derek Pacheco an appt.

## 2017-05-13 ENCOUNTER — Ambulatory Visit: Payer: BLUE CROSS/BLUE SHIELD | Admitting: Physician Assistant

## 2017-05-16 DIAGNOSIS — G4733 Obstructive sleep apnea (adult) (pediatric): Secondary | ICD-10-CM | POA: Diagnosis not present

## 2017-05-20 ENCOUNTER — Other Ambulatory Visit: Payer: Self-pay

## 2017-05-20 ENCOUNTER — Ambulatory Visit: Payer: BLUE CROSS/BLUE SHIELD | Admitting: Physician Assistant

## 2017-05-20 ENCOUNTER — Encounter: Payer: Self-pay | Admitting: Physician Assistant

## 2017-05-20 VITALS — BP 138/88 | HR 81 | Temp 98.6°F | Resp 16 | Ht 72.05 in | Wt 278.0 lb

## 2017-05-20 DIAGNOSIS — M25511 Pain in right shoulder: Secondary | ICD-10-CM

## 2017-05-20 MED ORDER — ALPRAZOLAM 1 MG PO TABS: 1.0000 mg | ORAL_TABLET | Freq: Every day | ORAL | 5 refills | Status: DC

## 2017-05-20 NOTE — Progress Notes (Signed)
05/22/2017 4:37 PM   DOB: 07/14/1965 / MRN: [id number]  SUBJECTIVE:  Derek Pacheco is a 52 y.o. male presenting for follow-up of asthma.  Unfortunately his respiratory IgE levels were completely normal.  He has seen pulmonology and has been diagnosed with COPD in the past.  He complains of right-sided shoulder pain that has been present now for about 2 weeks and has been somewhat severe in nature.  Ibuprofen does not help the pain.  The pain is worse with movement.   He has No Known Allergies.   He  has a past medical history of Allergy, Asthma, Bronchitis, GERD (gastroesophageal reflux disease), Insomnia, Migraines, and Sleep apnea.    He  reports that he quit smoking about 11 years ago. His smoking use included cigarettes. He has a 25.00 pack-year smoking history. he has never used smokeless tobacco. He reports that he drinks alcohol. He reports that he does not use drugs. He  reports that he currently engages in sexual activity. He reports using the following method of birth control/protection: None. The patient  has a past surgical history that includes Knee surgery (Right).  His family history includes Asthma in his mother.  Review of Systems  Constitutional: Negative for chills, diaphoresis and fever.  Eyes: Negative.  Negative for redness.  Respiratory: Negative for cough, hemoptysis, sputum production, shortness of breath and wheezing.   Cardiovascular: Negative for chest pain, orthopnea and leg swelling.  Gastrointestinal: Negative for nausea.  Musculoskeletal: Positive for joint pain and myalgias. Negative for back pain, falls and neck pain.  Skin: Negative for rash.  Neurological: Negative for dizziness, sensory change, speech change, focal weakness and headaches.    The problem list and medications were reviewed and updated by myself where necessary and exist elsewhere in the encounter.   OBJECTIVE:  BP 138/88   Pulse 81   Temp 98.6 F (37 C) (Oral)   Resp 16   Ht  6' 0.05" (1.83 m)   Wt 278 lb (126.1 kg)   SpO2 97%   BMI 37.65 kg/m   Physical Exam  Constitutional: He is active and cooperative.  Cardiovascular: Normal rate.  Pulmonary/Chest: Effort normal. No tachypnea.  Musculoskeletal: He exhibits tenderness (Left shoulder TTP about the left posterior aspect.  Negative for speeds, Michel Bickers.). He exhibits no edema or deformity.  Neurological: He is alert.  Skin: Skin is warm.  Vitals reviewed.   No results found for this or any previous visit (from the past 72 hour(s)).  No results found.  ASSESSMENT AND PLAN:  Derek Pacheco was seen today for asthma and shoulder pain.  Diagnoses and all orders for this visit:  Right shoulder pain, unspecified chronicity: Likely acute on chronic.  X-rays deferred today.  I have injected the shoulder with 40 of Kenalog and the patient has no immediate relief of symptoms.  There is likely an arthritic component.  Advised we can repeat this in about 3 months  Insomnia: Patient continues to struggle with poor sleep despite trazodone and several other, and sleep aids.  Alprazolam has worked in the past.  Fortunately he is now wearing CPAP.  I will carry this prescription for him. -     ALPRAZolam (XANAX) 1 MG tablet; Take 1 tablet (1 mg total) by mouth at bedtime.    The patient is advised to call or return to clinic if he does not see an improvement in symptoms, or to seek the care of the closest emergency department if he worsens  with the above plan.   Philis Fendt, MHS, PA-C [facility] 05/22/2017 4:37 PM

## 2017-05-20 NOTE — Patient Instructions (Signed)
     IF you received an x-ray today, you will receive an invoice from [facility]. Please contact [facility] at [phone] with questions or concerns regarding your invoice.   IF you received labwork today, you will receive an invoice from [facility]. Please contact [facility] at [phone] with questions or concerns regarding your invoice.   Our billing staff will not be able to assist you with questions regarding bills from these companies.  You will be contacted with the lab results as soon as they are available. The fastest way to get your results is to activate your My Chart account. Instructions are located on the last page of this paperwork. If you have not heard from us regarding the results in 2 weeks, please contact this office.     

## 2017-06-13 ENCOUNTER — Encounter: Payer: Self-pay | Admitting: Neurology

## 2017-06-13 DIAGNOSIS — G4733 Obstructive sleep apnea (adult) (pediatric): Secondary | ICD-10-CM | POA: Diagnosis not present

## 2017-06-15 ENCOUNTER — Ambulatory Visit: Payer: BLUE CROSS/BLUE SHIELD | Admitting: Neurology

## 2017-06-15 ENCOUNTER — Encounter: Payer: Self-pay | Admitting: Neurology

## 2017-06-15 VITALS — BP 129/87 | HR 72 | Ht 72.0 in | Wt 289.0 lb

## 2017-06-15 DIAGNOSIS — Z9989 Dependence on other enabling machines and devices: Secondary | ICD-10-CM

## 2017-06-15 DIAGNOSIS — G4733 Obstructive sleep apnea (adult) (pediatric): Secondary | ICD-10-CM

## 2017-06-15 NOTE — Progress Notes (Signed)
Subjective:    Patient ID: Derek Pacheco is a 52 y.o. male.  HPI     Interim history:   Derek Pacheco is a 52 year old right-handed gentleman with an underlying medical history of asthma, bronchitis, migraine headaches, elevated blood pressure values and obesity, who presents for follow-up consultation of his obstructive sleep apnea, after recent sleep study testing and starting CPAP therapy. The patient is unaccompanied today. I first met him on 12/30/2016 at the request of his primary care provider, at which time the patient reported nonrestorative sleep, daytime tiredness and chronic difficulty achieving sleep. I suggested we proceed with sleep study testing. He had a home sleep test on 02/08/2017 which showed evidence of severe obstructive sleep apnea with an AHI of 36.6 per hour, average oxygen saturation of 89%, nadir of 76% with significant time below or at 88% saturation of 145 minutes. Based on these test results he was advised to return for a full night CPAP titration study. He had this on 03/20/2017. Sleep efficiency was 90.9%, sleep latency 20.5 minutes and REM latency 106 minutes. He had an increased percentage of slow-wave sleep and REM sleep was near normal at 18.7%. He was fitted with a nasal mask and CPAP was titrated from 5 cm to 6 cm, on the final pressure his AHI was 0.3 per hour with nonsupine REM sleep achieved an O2 nadir of 90%. He had mild PLMS with minimal arousals. I did suggest a home CPAP treatment pressure of 7 cm.  Today, 06/15/2017: I reviewed his CPAP compliance data from 05/15/2017 through 06/13/2017 which is a total of 30 days, during which time he used his CPAP every night with percent used days greater than 4 hours at 83%, indicating very good compliance with an average usage of 5 hours and 17 minutes, residual AHI at 2.6 per hour, leak acceptable with the 95th percentile at 15.2 L/m on a pressure of 7 cm with EPR of 3. He reports not feeling that much difference in  his sleep-related issues, but is overall motivated to continue with CPAP. He is compliant with treatment. Weight has been up and down. He is no longer on trazodone and takes xanax 1 mg qHS.    Previously:  12/30/2016: (He) reports chronic difficulty with his sleep including nonrestorative sleep, daytime tiredness. I reviewed your office note from 11/26/2016 as well as 12/17/2016. He has taken Xanax at night for sleep, but has not had any in months. In the past, he has tried multiple other prescription medications for sleep including Ambien which did not help, Lunesta, Ativan, Valium, so not a, clonazepam, all of which did not help, most recently, he tried Belsomra, which did not help. He is not sure if he snores, girlfriend does not complain about it. She sleeps in a separate bedroom however and has been diagnosed with sleep apnea, uses a CPAP machine successfully. He has very occasional restless leg symptoms, is a restless sleeper, tosses and turns at times, sometimes tries to sleep with a pillow in between his legs. He has had occasional morning headache and nocturia about once per average night, wake up time for work is around 5 or 6 and bedtime typically somewhere between 10 PM and midnight, he typically watches TV on a timer for about 20 minutes. He has 1 dog and 2 cats, sometimes in his bed. He lives with his long-term girlfriend, they have no children. His Epworth sleepiness score is about 9 out of 24 today, fatigue score is 49 out  of 63. He quit smoking in 2008, he drinks alcohol occasionally, caffeine in the form soda, about 3-4, but some without.   His Past Medical History Is Significant For: Past Medical History:  Diagnosis Date  . Allergy   . Asthma   . Bronchitis   . GERD (gastroesophageal reflux disease)   . Insomnia   . Migraines   . Sleep apnea     His Past Surgical History Is Significant For: Past Surgical History:  Procedure Laterality Date  . KNEE SURGERY Right    2017     His Family History Is Significant For: Family History  Problem Relation Age of Onset  . Asthma Mother   . Colon cancer Neg Hx   . Colon polyps Neg Hx   . Esophageal cancer Neg Hx   . Rectal cancer Neg Hx   . Stomach cancer Neg Hx     His Social History Is Significant For: Social History   Socioeconomic History  . Marital status: Significant Other    Spouse name: None  . Number of children: None  . Years of education: None  . Highest education level: None  Social Needs  . Financial resource strain: None  . Food insecurity - worry: None  . Food insecurity - inability: None  . Transportation needs - medical: None  . Transportation needs - non-medical: None  Occupational History  . None  Tobacco Use  . Smoking status: Former Smoker    Packs/day: 1.00    Years: 25.00    Pack years: 25.00    Types: Cigarettes    Last attempt to quit: 03/29/2006    Years since quitting: 11.2  . Smokeless tobacco: Never Used  Substance and Sexual Activity  . Alcohol use: Yes    Alcohol/week: 0.0 oz    Comment: rarely- nothing for a few years  . Drug use: No  . Sexual activity: Yes    Birth control/protection: None  Other Topics Concern  . None  Social History Narrative  . None    His Allergies Are:  No Known Allergies:   His Current Medications Are:  Outpatient Encounter Medications as of 06/15/2017  Medication Sig  . acyclovir ointment (ZOVIRAX) 5 % USE EVERY 4 TO 6 HOURS AS NEEDED FOR COLD SORES  . albuterol (PROVENTIL) (2.5 MG/3ML) 0.083% nebulizer solution Take 3 mLs (2.5 mg total) by nebulization every 6 (six) hours as needed for wheezing. Please place on filer for patient.  . ALPRAZolam (XANAX) 1 MG tablet Take 1 tablet (1 mg total) by mouth at bedtime.  . budesonide-formoterol (SYMBICORT) 160-4.5 MCG/ACT inhaler Inhale 2 puffs into the lungs 2 (two) times daily.  . fluticasone (FLONASE) 50 MCG/ACT nasal spray Place 2 sprays into both nostrils daily as needed for allergies  or rhinitis.  Marland Kitchen ipratropium (ATROVENT) 0.02 % nebulizer solution Take 2.5 mLs (0.5 mg total) by nebulization 4 (four) times daily.  . NON FORMULARY Stool softeners take daily due to hemorrhoids  . nystatin (MYCOSTATIN) 100000 UNIT/ML suspension Take 5 mLs (500,000 Units total) by mouth 4 (four) times daily. Nystatin swish and swallow.  . pantoprazole (PROTONIX) 40 MG tablet Take 1 tablet (40 mg total) by mouth daily. Always take on an empty stomach in the morning and eat something 30 minutes after taking.  . Peak Flow Meter (AIRZONE PEAK FLOW METER) DEVI Please use daily to keep track of your breathing.  . SUMAtriptan (IMITREX) 100 MG tablet TAKE ONE-HALF TO ONE TABLET BY MOUTH AS NEEDED FOR  MIGRAINE. MAX 200 MG IN 24 HOURS.  Marland Kitchen topiramate (TOPAMAX) 50 MG tablet Take 1 tablet (50 mg total) by mouth at bedtime.  Marland Kitchen umeclidinium bromide (INCRUSE ELLIPTA) 62.5 MCG/INH AEPB Inhale 1 puff daily into the lungs.  . [DISCONTINUED] azithromycin (ZITHROMAX) 250 MG tablet Take two tabs on day one and one daily thereafter   Facility-Administered Encounter Medications as of 06/15/2017  Medication  . 0.9 %  sodium chloride infusion  :  Review of Systems:  Out of a complete 14 point review of systems, all are reviewed and negative with the exception of these symptoms as listed below: Review of Systems  Neurological:       Patient is here for a follow up after starting on his cpap. Patient reports that he doesn't feel much different.     Objective:  Neurological Exam  Physical Exam Physical Examination:   Vitals:   06/15/17 1336  BP: 129/87  Pulse: 72    General Examination: The patient is a very pleasant 52 y.o. male in no acute distress. He appears well-developed and well-nourished and well groomed.   HEENT: Normocephalic, atraumatic, pupils are equal, round and reactive to light and accommodation. Extraocular tracking is good without limitation to gaze excursion or nystagmus noted. Normal smooth  pursuit is noted. Hearing is grossly intact. Face is symmetric with normal facial animation and normal facial sensation. Speech is clear with no dysarthria noted. There is no hypophonia. There is no lip, neck/head, jaw or voice tremor. Neck FROM, oropharynx exam reveals: mild mouth dryness, adequate dental hygiene and moderate airway crowding. Mallampati is class III. Tongue protrudes centrally and palate elevates symmetrically.   Chest: Clear to auscultation without wheezing, rhonchi or crackles noted.  Heart: S1+S2+0, regular and normal without murmurs, rubs or gallops noted.   Abdomen: Soft, non-tender and non-distended with normal bowel sounds appreciated on auscultation.  Extremities: There is no pitting edema in the distal lower extremities bilaterally.   Skin: Warm and dry without trophic changes noted.  Musculoskeletal: exam reveals no obvious joint deformities, tenderness or joint swelling or erythema.   Neurologically:  Mental status: The patient is awake, alert and oriented in all 4 spheres. His immediate and remote memory, attention, language skills and fund of knowledge are appropriate. There is no evidence of aphasia, agnosia, apraxia or anomia. Speech is clear with normal prosody and enunciation. Thought process is linear. Mood is normal and affect is normal.  Cranial nerves II - XII are as described above under HEENT exam.  Motor exam: Normal bulk, strength and tone is noted. There is no drift, tremor or rebound. Romberg is negative. Fine motor skills and coordination: grossly intact.  Cerebellar testing: No dysmetria or intention tremor. There is no truncal or gait ataxia.  Sensory exam: intact to light touch in the upper and lower extremities.  Gait, station and balance: He stands easily. No veering to one side is noted. No leaning to one side is noted. Posture is age-appropriate and stance is narrow based. Gait shows normal stride length and normal pace. No problems  turning are noted. Tandem walk is unremarkable.   Assessment and Plan:  In summary, YOVAN LEEMAN is a very pleasant 52 year old male with an underlying medical history of asthma, bronchitis, migraine headaches, elevated blood pressure values and obesity, who presents for follow-up consultation of his sleep disturbance, new diagnosis of severe obstructive sleep apnea based on a home sleep test in November 2018. He then return for a full night  CPAP titration study in December 2018 which showed good results with CPAP at a low pressure. He is now on CPAP therapy at home, compliant at a pressure of 7 cm via full facemask. He is not currently bothered by restless leg symptoms. His CPAP titration study showed very mild PLMS with no significant arousals. He actually slept quite well during the attended sleep study with a sleep efficiency of 90.9% at the time. He is commended for his treatment adherence with CPAP. He does not have a telltale response from it but is quite reassured and encouraged to continue and motivated to continue to be compliant with CPAP therapy. He is encouraged to work on weight loss. At this juncture I suggested a six-month follow-up briefly with one of our nurse practitioners and that we can see him hopefully once a year after that. I answered all his questions today and he was in agreement. I again explained the risks and ramifications of untreated moderate to severe OSA, especially with respect to developing cardiovascular disease down the Road, including congestive heart failure, difficult to treat hypertension, cardiac arrhythmias, or stroke. Even type 2 diabetes has, in part, been linked to untreated OSA. Symptoms of untreated OSA include daytime sleepiness, memory problems, mood irritability and mood disorder such as depression and anxiety, lack of energy, as well as recurrent headaches, especially morning headaches. We talked about trying to maintain a healthy lifestyle in general, as  well as the importance of weight control.  I spent 25 minutes in total face-to-face time with the patient, more than 50% of which was spent in counseling and coordination of care, reviewing test results, reviewing medication and discussing or reviewing the diagnosis of OSA, its prognosis and treatment options. Pertinent laboratory and imaging test results that were available during this visit with the patient were reviewed by me and considered in my medical decision making (see chart for details).

## 2017-06-15 NOTE — Patient Instructions (Addendum)
Please continue using your CPAP regularly. While your insurance may require that you use CPAP at least 4 hours each night on 70% of the nights, I recommend, that you not skip any nights and use it throughout the night if you can. Getting used to CPAP and staying with the treatment long term does take time and patience and discipline. Untreated obstructive sleep apnea when it is moderate to severe can have an adverse impact on cardiovascular health and raise her risk for heart disease, arrhythmias, hypertension, congestive heart failure, stroke and diabetes. Untreated obstructive sleep apnea causes sleep disruption, nonrestorative sleep, and sleep deprivation. This can have an impact on your day to day functioning and cause daytime sleepiness and impairment of cognitive function, memory loss, mood disturbance, and problems focussing. Using CPAP regularly can improve these symptoms.  Great job with the CPAP, work on weight loss.   Keep up the good work! We can see you in 6 months, you can see one of our nurse practitioners as you are stable. I will see you after that.

## 2017-07-07 ENCOUNTER — Ambulatory Visit: Payer: Self-pay | Admitting: *Deleted

## 2017-07-07 NOTE — Telephone Encounter (Signed)
Pt experiencing shortness of breath that began to worsen today. Pt has a history of COPD and is currently at work. Pt has labored breathing while talking on the phone with triage nurse and also states he has wheezing at timesPt advised to seek treatment in the ED or Urgent Care due to current symptoms. Pt states he can not afford to go to the Urgent Care due to the copay and he prefers to be seen by [facility] instead of going to another doctor..Pt states his symptoms have been  worse than they are currently and he has been seen in the office and is requesting to come in for an appt with Waverly Ferrari on tomorrow or to come in today with another provider.  Pt also refusing to go to the ED.Pt states that he is now at work in [place].    Notified pt that Micheal Clark,PA was not in the office to be seen today and that the other providers in the office were booked for appts today as well. Lizzie, flow coordinator contacted to speak with pt regarding disposition. Reason for Disposition . [1] MODERATE difficulty breathing (e.g., speaks in phrases, SOB even at rest, pulse 100-120) AND [2] NEW-onset or WORSE than normal  Answer Assessment - Initial Assessment Questions 1. RESPIRATORY STATUS: "Describe your breathing?" (e.g., wheezing, shortness of breath, unable to speak, severe coughing)      Shortness of breath 2. ONSET: "When did this breathing problem begin?"      yesterday 3. PATTERN "Does the difficult breathing come and go, or has it been constant since it started?"      constant 4. SEVERITY: "How bad is your breathing?" (e.g., mild, moderate, severe)    - MILD: No SOB at rest, mild SOB with walking, speaks normally in sentences, can lay down, no retractions, pulse < 100.    - MODERATE: SOB at rest, SOB with minimal exertion and prefers to sit, cannot lie down flat, speaks in phrases, mild retractions, audible wheezing, pulse 100-120.    - SEVERE: Very SOB at rest, speaks in single words,  struggling to breathe, sitting hunched forward, retractions, pulse > 120       5. RECURRENT SYMPTOM: "Have you had difficulty breathing before?" If so, ask: "When was the last time?" and "What happened that time?"       6. CARDIAC HISTORY: "Do you have any history of heart disease?" (e.g., heart attack, angina, bypass surgery, angioplasty)       7. LUNG HISTORY: "Do you have any history of lung disease?"  (e.g., pulmonary embolus, asthma, emphysema)      8. CAUSE: "What do you think is causing the breathing problem?"       9. OTHER SYMPTOMS: "Do you have any other symptoms? (e.g., dizziness, runny nose, cough, chest pain, fever)     Cough, a little dizziness  10. TRAVEL: "Have you traveled out of the country in the last month?" (e.g., travel history, exposures)  Protocols used: BREATHING DIFFICULTY-A-AH

## 2017-07-07 NOTE — Telephone Encounter (Addendum)
Call transferred from call center. Spoke with patient. Advised patient this RN agrees with Larene Beach RN's assessment to seek care urgently for shortness of breath. Patient states he disagrees, he has been worse before, and he does not think he needs to go in. Patient agreeable to making appointment tomorrow with Philis Fendt, PA-C and presenting to ED or urgent care if any worsening symptoms. Patient transferred to front desk to schedule.

## 2017-07-08 ENCOUNTER — Encounter: Payer: Self-pay | Admitting: Physician Assistant

## 2017-07-08 ENCOUNTER — Other Ambulatory Visit: Payer: Self-pay

## 2017-07-08 ENCOUNTER — Ambulatory Visit: Payer: BLUE CROSS/BLUE SHIELD | Admitting: Physician Assistant

## 2017-07-08 VITALS — BP 148/88 | HR 102 | Temp 98.0°F | Resp 16 | Ht 72.05 in | Wt 275.0 lb

## 2017-07-08 DIAGNOSIS — J449 Chronic obstructive pulmonary disease, unspecified: Secondary | ICD-10-CM | POA: Diagnosis not present

## 2017-07-08 DIAGNOSIS — J441 Chronic obstructive pulmonary disease with (acute) exacerbation: Secondary | ICD-10-CM | POA: Diagnosis not present

## 2017-07-08 MED ORDER — IPRATROPIUM BROMIDE 0.02 % IN SOLN: 0.5000 mg | Freq: Four times a day (QID) | RESPIRATORY_TRACT | 18 refills | Status: DC

## 2017-07-08 MED ORDER — METHYLPREDNISOLONE SODIUM SUCC 125 MG IJ SOLR
125.0000 mg | Freq: Once | INTRAMUSCULAR | Status: AC
Start: 2017-07-08 — End: 2017-07-08
  Administered 2017-07-08: 125 mg via INTRAMUSCULAR

## 2017-07-08 MED ORDER — ALBUTEROL SULFATE (2.5 MG/3ML) 0.083% IN NEBU: 2.5000 mg | INHALATION_SOLUTION | Freq: Four times a day (QID) | RESPIRATORY_TRACT | 12 refills | Status: AC | PRN

## 2017-07-08 MED ORDER — PREDNISONE 20 MG PO TABS: ORAL_TABLET | ORAL | 0 refills | Status: AC

## 2017-07-08 MED ORDER — ALBUTEROL SULFATE (2.5 MG/3ML) 0.083% IN NEBU
2.5000 mg | INHALATION_SOLUTION | Freq: Once | RESPIRATORY_TRACT | Status: AC
  Administered 2017-07-08: 2.5 mg via RESPIRATORY_TRACT

## 2017-07-08 MED ORDER — DOXYCYCLINE HYCLATE 100 MG PO CAPS: 100.0000 mg | ORAL_CAPSULE | Freq: Two times a day (BID) | ORAL | 0 refills | Status: AC

## 2017-07-08 MED ORDER — IPRATROPIUM BROMIDE 0.02 % IN SOLN
0.5000 mg | Freq: Once | RESPIRATORY_TRACT | Status: AC
  Administered 2017-07-08: 0.5 mg via RESPIRATORY_TRACT

## 2017-07-08 MED ORDER — BUDESONIDE-FORMOTEROL FUMARATE 160-4.5 MCG/ACT IN AERO: 2.0000 | INHALATION_SPRAY | Freq: Two times a day (BID) | RESPIRATORY_TRACT | 12 refills | Status: AC

## 2017-07-08 NOTE — Progress Notes (Signed)
07/08/2017 4:11 PM   DOB: 14-Jul-1965 / MRN: [id number]  SUBJECTIVE:  Derek Pacheco is a 51 y.o. male presenting for wheezing.  History of COPD.  Gets three to four flares per year.  This started 2 days ago.  Peak flow went from "greene to red after I breathed in some concrete dust by accident." he tries to wear a mask when he knows he is going to come into contact with dust and construction particles. He would like refills of hi breathing supplies.   He has No Known Allergies.   He  has a past medical history of Allergy, Asthma, Bronchitis, GERD (gastroesophageal reflux disease), Insomnia, Migraines, and Sleep apnea.    He  reports that he quit smoking about 11 years ago. His smoking use included cigarettes. He has a 25.00 pack-year smoking history. He has never used smokeless tobacco. He reports that he drinks alcohol. He reports that he does not use drugs. He  reports that he currently engages in sexual activity. He reports using the following method of birth control/protection: None. The patient  has a past surgical history that includes Knee surgery (Right).  His family history includes Asthma in his mother.  Review of Systems  Constitutional: Negative for chills, diaphoresis and fever.  Eyes: Negative.   Respiratory: Positive for cough, shortness of breath and wheezing. Negative for hemoptysis and sputum production.   Cardiovascular: Negative for chest pain, orthopnea and leg swelling.  Gastrointestinal: Negative for abdominal pain, blood in stool, constipation, diarrhea, heartburn, melena, nausea and vomiting.  Genitourinary: Negative for flank pain.  Skin: Negative for rash.  Neurological: Negative for dizziness, sensory change, speech change, focal weakness and headaches.    The problem list and medications were reviewed and updated by myself where necessary and exist elsewhere in the encounter.   OBJECTIVE:  BP (!) 148/88   Pulse (!) 102   Temp 98 F (36.7 C) (Oral)    Resp 16   Ht 6' 0.05" (1.83 m)   Wt 275 lb (124.7 kg)   SpO2 96%   BMI 37.25 kg/m   Wt Readings from Last 3 Encounters:  07/08/17 275 lb (124.7 kg)  06/15/17 289 lb (131.1 kg)  05/20/17 278 lb (126.1 kg)   Temp Readings from Last 3 Encounters:  07/08/17 98 F (36.7 C) (Oral)  05/20/17 98.6 F (37 C) (Oral)  04/29/17 97.7 F (36.5 C) (Oral)   BP Readings from Last 3 Encounters:  07/08/17 (!) 148/88  06/15/17 129/87  05/20/17 138/88   Pulse Readings from Last 3 Encounters:  07/08/17 (!) 102  06/15/17 72  05/20/17 81     Physical Exam  Cardiovascular: Normal rate and normal heart sounds.  Pulmonary/Chest: Effort normal and breath sounds normal. No stridor. No respiratory distress. He has no rales. He exhibits no tenderness.    No results found for this or any previous visit (from the past 72 hour(s)).  No results found.  ASSESSMENT AND PLAN:  Derek Pacheco was seen today for copd.  Diagnoses and all orders for this visit:  COPD exacerbation ([place]) -     methylPREDNISolone sodium succinate (SOLU-MEDROL) 125 mg/2 mL injection 125 mg -     albuterol (PROVENTIL) (2.5 MG/3ML) 0.083% nebulizer solution 2.5 mg -     ipratropium (ATROVENT) nebulizer solution 0.5 mg -     doxycycline (VIBRAMYCIN) 100 MG capsule; Take 1 capsule (100 mg total) by mouth 2 (two) times daily for 10 days. -  predniSONE (DELTASONE) 20 MG tablet; Take 3 in the morning for 3 days, then 2 in the morning for 3 days, and then 1 in the morning for 3 days.  Stage 2 moderate COPD by GOLD classification (HCC) -     budesonide-formoterol (SYMBICORT) 160-4.5 MCG/ACT inhaler; Inhale 2 puffs into the lungs 2 (two) times daily. -     albuterol (PROVENTIL) (2.5 MG/3ML) 0.083% nebulizer solution; Take 3 mLs (2.5 mg total) by nebulization every 6 (six) hours as needed for wheezing. Please place on filer for patient. -     ipratropium (ATROVENT) 0.02 % nebulizer solution; Take 2.5 mLs (0.5 mg total) by nebulization 4  (four) times daily.    The patient is advised to call or return to clinic if he does not see an improvement in symptoms, or to seek the care of the closest emergency department if he worsens with the above plan.   Philis Fendt, MHS, PA-C [facility] 07/08/2017 4:11 PM

## 2017-07-08 NOTE — Patient Instructions (Signed)
     IF you received an x-ray today, you will receive an invoice from [facility]. Please contact [facility] at [phone] with questions or concerns regarding your invoice.   IF you received labwork today, you will receive an invoice from [facility]. Please contact [facility] at [phone] with questions or concerns regarding your invoice.   Our billing staff will not be able to assist you with questions regarding bills from these companies.  You will be contacted with the lab results as soon as they are available. The fastest way to get your results is to activate your My Chart account. Instructions are located on the last page of this paperwork. If you have not heard from us regarding the results in 2 weeks, please contact this office.     

## 2017-08-31 ENCOUNTER — Encounter: Payer: Self-pay | Admitting: Physician Assistant

## 2017-08-31 ENCOUNTER — Other Ambulatory Visit: Payer: Self-pay

## 2017-08-31 ENCOUNTER — Ambulatory Visit: Payer: BLUE CROSS/BLUE SHIELD | Admitting: Physician Assistant

## 2017-08-31 VITALS — BP 170/118 | HR 97 | Temp 98.1°F | Ht 72.0 in | Wt 278.6 lb

## 2017-08-31 DIAGNOSIS — J441 Chronic obstructive pulmonary disease with (acute) exacerbation: Secondary | ICD-10-CM

## 2017-08-31 MED ORDER — PREDNISONE 20 MG PO TABS: ORAL_TABLET | ORAL | 0 refills | Status: AC

## 2017-08-31 MED ORDER — IPRATROPIUM BROMIDE 0.02 % IN SOLN
0.5000 mg | Freq: Once | RESPIRATORY_TRACT | Status: AC
  Administered 2017-08-31: 0.5 mg via RESPIRATORY_TRACT

## 2017-08-31 MED ORDER — METHYLPREDNISOLONE SODIUM SUCC 125 MG IJ SOLR: 125.0000 mg | Freq: Once | INTRAMUSCULAR | Status: DC

## 2017-08-31 MED ORDER — AZITHROMYCIN 250 MG PO TABS: ORAL_TABLET | ORAL | 0 refills | Status: AC

## 2017-08-31 MED ORDER — METHYLPREDNISOLONE SODIUM SUCC 125 MG IJ SOLR
125.0000 mg | Freq: Once | INTRAMUSCULAR | Status: AC
  Administered 2017-08-31: 125 mg via INTRAMUSCULAR

## 2017-08-31 MED ORDER — ALBUTEROL SULFATE (2.5 MG/3ML) 0.083% IN NEBU
2.5000 mg | INHALATION_SOLUTION | Freq: Once | RESPIRATORY_TRACT | Status: AC
  Administered 2017-08-31: 2.5 mg via RESPIRATORY_TRACT

## 2017-08-31 NOTE — Patient Instructions (Addendum)
Is good to see you again today recommend.  It is okay to try prednisone by mouth in the future if you beginning to have a flare of your symptoms.  I sent a refill of the prednisone and I sent a Z-Pak to the pharmacy.  Please come back if you need me any further for this acute episode.  I am always happy to see you for any reason.    IF you received an x-ray today, you will receive an invoice from [facility]. Please contact [facility] at [phone] with questions or concerns regarding your invoice.   IF you received labwork today, you will receive an invoice from [facility]. Please contact [facility] at [phone] with questions or concerns regarding your invoice.   Our billing staff will not be able to assist you with questions regarding bills from these companies.  You will be contacted with the lab results as soon as they are available. The fastest way to get your results is to activate your My Chart account. Instructions are located on the last page of this paperwork. If you have not heard from [place] regarding the results in 2 weeks, please contact this office.

## 2017-08-31 NOTE — Addendum Note (Signed)
Addended by: Tereasa Coop on: 08/31/2017 04:34 PM   Modules accepted: Orders

## 2017-08-31 NOTE — Progress Notes (Signed)
08/31/2017 4:09 PM   DOB: January 28, 1966 / MRN: [id number]  SUBJECTIVE:  Derek MERRIOTT is a 52 y.o. male presenting for shortness of breath.  Patient has a long history of COPD with frequent exacerbation for which I will see him at least 3-4 times a year.  He responds well to IM Solu-Medrol 125, p.o. antibiotics and p.o. steroids.  He tends to make a turnaround after about 4 days of treatment.  He is a former smoker.  Patient notes that his peak flow is "in the red."  He has No Known Allergies.   He  has a past medical history of Allergy, Asthma, Bronchitis, GERD (gastroesophageal reflux disease), Insomnia, Migraines, and Sleep apnea.    He  reports that he quit smoking about 11 years ago. His smoking use included cigarettes. He has a 25.00 pack-year smoking history. He has never used smokeless tobacco. He reports that he drinks alcohol. He reports that he does not use drugs. He  reports that he currently engages in sexual activity. He reports using the following method of birth control/protection: None. The patient  has a past surgical history that includes Knee surgery (Right).  His family history includes Asthma in his mother.  Review of Systems  Constitutional: Negative for chills and fever.  Respiratory: Positive for cough, sputum production, shortness of breath and wheezing. Negative for hemoptysis.   Cardiovascular: Negative for chest pain.  Skin: Negative for itching and rash.  Neurological: Negative for dizziness.    The problem list and medications were reviewed and updated by myself where necessary and exist elsewhere in the encounter.   OBJECTIVE:  BP (!) 170/118 (BP Location: Left Arm, Patient Position: Sitting, Cuff Size: Large)   Pulse 97   Temp 98.1 F (36.7 C) (Oral)   Ht 6' (1.829 m)   Wt 278 lb 9.6 oz (126.4 kg)   SpO2 97%   BMI 37.78 kg/m   Physical Exam  Constitutional: He is oriented to person, place, and time. He appears well-developed. He does not appear  ill.  Eyes: Pupils are equal, round, and reactive to light. Conjunctivae and EOM are normal.  Cardiovascular: Normal rate, regular rhythm, S1 normal, S2 normal, normal heart sounds, intact distal pulses and normal pulses. Exam reveals no gallop and no friction rub.  No murmur heard. Pulmonary/Chest: Effort normal. No stridor. No respiratory distress. He has no wheezes. He has no rales.  Abdominal: He exhibits no distension.  Musculoskeletal: Normal range of motion. He exhibits no edema.  Neurological: He is alert and oriented to person, place, and time. No cranial nerve deficit. Coordination normal.  Skin: Skin is warm and dry. He is not diaphoretic.  Psychiatric: He has a normal mood and affect.  Nursing note and vitals reviewed.   No results found for this or any previous visit (from the past 72 hour(s)).  No results found.  ASSESSMENT AND PLAN:  Emelio was seen today for cough.  Diagnoses and all orders for this visit:  COPD with acute exacerbation ([place]) it is not uncommon for him to have a long day of work with shortness of breath and come in acutely hypertensive.  I monitored his pressure in the past via ambulatory monitoring and his numbers have always been excellent.  I will treat his COPD flare as this is the most likely cause of his elevated blood pressure today. -     methylPREDNISolone sodium succinate (SOLU-MEDROL) 125 mg/2 mL injection 125 mg -  predniSONE (DELTASONE) 20 MG tablet; Take 3 in the morning for 3 days, then 2 in the morning for 3 days, and then 1 in the morning for 3 days. -     azithromycin (ZITHROMAX) 250 MG tablet; Take two tabs on day on and one daily thereafter. -     albuterol (PROVENTIL) (2.5 MG/3ML) 0.083% nebulizer solution 2.5 mg -     ipratropium (ATROVENT) nebulizer solution 0.5 mg    The patient is advised to call or return to clinic if he does not see an improvement in symptoms, or to seek the care of the closest emergency department if he  worsens with the above plan.   Philis Fendt, MHS, PA-C [facility] 08/31/2017 4:09 PM

## 2017-09-07 ENCOUNTER — Ambulatory Visit: Payer: BLUE CROSS/BLUE SHIELD | Admitting: Internal Medicine

## 2017-09-12 ENCOUNTER — Telehealth: Payer: Self-pay | Admitting: Physician Assistant

## 2017-09-12 NOTE — Telephone Encounter (Signed)
Pt returned call, pt says that he had some blood in his urine so he was told by cologuard to not worry about it and pt completed a colonoscopy instead.

## 2017-09-12 NOTE — Telephone Encounter (Signed)
Received fax from [employer] stating pt's order for Cologuard was cancelled due to no response from patient. Per Philis Fendt PA-C he would like pt to do this.  Left vm for pt to call back to assure he is ok with doing testing before re-ordering.

## 2017-09-13 NOTE — Telephone Encounter (Signed)
I see.  He is correct.  Colonoscopy is complete.  No further action is required. Philis Fendt, MS, PA-C 8:55 AM, 09/13/2017

## 2017-09-28 ENCOUNTER — Ambulatory Visit: Payer: BLUE CROSS/BLUE SHIELD | Admitting: Physician Assistant

## 2017-09-28 ENCOUNTER — Other Ambulatory Visit: Payer: Self-pay

## 2017-09-28 ENCOUNTER — Encounter: Payer: Self-pay | Admitting: Physician Assistant

## 2017-09-28 VITALS — BP 150/110 | HR 96 | Temp 98.1°F | Resp 16 | Ht 71.0 in | Wt 274.8 lb

## 2017-09-28 DIAGNOSIS — R062 Wheezing: Secondary | ICD-10-CM

## 2017-09-28 DIAGNOSIS — J441 Chronic obstructive pulmonary disease with (acute) exacerbation: Secondary | ICD-10-CM | POA: Diagnosis not present

## 2017-09-28 DIAGNOSIS — R030 Elevated blood-pressure reading, without diagnosis of hypertension: Secondary | ICD-10-CM

## 2017-09-28 MED ORDER — PREDNISONE 20 MG PO TABS: ORAL_TABLET | ORAL | 0 refills | Status: DC

## 2017-09-28 MED ORDER — IPRATROPIUM BROMIDE 0.02 % IN SOLN
0.5000 mg | Freq: Once | RESPIRATORY_TRACT | Status: AC
  Administered 2017-09-28: 0.5 mg via RESPIRATORY_TRACT

## 2017-09-28 MED ORDER — METHYLPREDNISOLONE SODIUM SUCC 125 MG IJ SOLR
125.0000 mg | Freq: Once | INTRAMUSCULAR | Status: AC
  Administered 2017-09-28: 125 mg via INTRAMUSCULAR

## 2017-09-28 MED ORDER — AZITHROMYCIN 250 MG PO TABS: ORAL_TABLET | ORAL | 0 refills | Status: DC

## 2017-09-28 MED ORDER — ALBUTEROL SULFATE (2.5 MG/3ML) 0.083% IN NEBU
5.0000 mg | INHALATION_SOLUTION | Freq: Once | RESPIRATORY_TRACT | Status: AC
  Administered 2017-09-28: 5 mg via RESPIRATORY_TRACT

## 2017-09-28 NOTE — Progress Notes (Signed)
Derek Pacheco  MRN: [id number] DOB: 11-May-1965  PCP: [name]  Subjective:  Pt is a 52 year old male PMH COPD who presents to clinic for cough and wheezing x 3 days. Pt has long h/o COPD exacerbations - at least 3-4/year, usually sees PA-C Philis Fendt. As per Michael's last OV note on 6/5: "He responds well to IM Solu-Medrol 125, p.o. antibiotics and p.o. steroids.  He tends to make a turnaround after about 4 days of treatment."  Today he states he has been using his Albuterol neb every 2 hrs, "Helps for an hour or two".  Exacerbations seem to be related to conditions at his place of work. He works Architect, wears a mask. Some job sites are dirty which makes his breathing more labored. Recently has been working in a dirty work environment.   the patient  has a past medical history of Allergy, Asthma, Bronchitis, GERD (gastroesophageal reflux disease), Insomnia, Migraines, and Sleep apnea.    Review of Systems  Constitutional: Negative for chills, diaphoresis, fatigue and fever.  HENT: Positive for congestion.   Respiratory: Positive for cough, shortness of breath and wheezing.   Cardiovascular: Negative for chest pain and palpitations.    Patient Active Problem List   Diagnosis Date Noted  . Elevated LFTs 09/13/2016  . Stopped smoking with greater than 20 pack year history 10/23/2015  . COPD (chronic obstructive pulmonary disease) ([place]) 07/02/2015  . Insomnia 05/10/2012    Current Outpatient Medications on File Prior to Visit  Medication Sig Dispense Refill  . acyclovir ointment (ZOVIRAX) 5 % USE EVERY 4 TO 6 HOURS AS NEEDED FOR COLD SORES 15 g 2  . albuterol (PROVENTIL) (2.5 MG/3ML) 0.083% nebulizer solution Take 3 mLs (2.5 mg total) by nebulization every 6 (six) hours as needed for wheezing. Please place on filer for patient. 75 mL 12  . ALPRAZolam (XANAX) 1 MG tablet Take 1 tablet (1 mg total) by mouth at bedtime. 30 tablet 5  . budesonide-formoterol  (SYMBICORT) 160-4.5 MCG/ACT inhaler Inhale 2 puffs into the lungs 2 (two) times daily. 1 Inhaler 12  . fluticasone (FLONASE) 50 MCG/ACT nasal spray Place 2 sprays into both nostrils daily as needed for allergies or rhinitis.    Marland Kitchen ipratropium (ATROVENT) 0.02 % nebulizer solution Take 2.5 mLs (0.5 mg total) by nebulization 4 (four) times daily. 75 mL 18  . nystatin (MYCOSTATIN) 100000 UNIT/ML suspension Take 5 mLs (500,000 Units total) by mouth 4 (four) times daily. Nystatin swish and swallow. 60 mL 0  . pantoprazole (PROTONIX) 40 MG tablet Take 1 tablet (40 mg total) by mouth daily. Always take on an empty stomach in the morning and eat something 30 minutes after taking. 90 tablet 3  . Peak Flow Meter (AIRZONE PEAK FLOW METER) DEVI Please use daily to keep track of your breathing. 1 each 0  . SUMAtriptan (IMITREX) 100 MG tablet TAKE ONE-HALF TO ONE TABLET BY MOUTH AS NEEDED FOR MIGRAINE. MAX 200 MG IN 24 HOURS. 9 tablet 3  . topiramate (TOPAMAX) 50 MG tablet Take 1 tablet (50 mg total) by mouth at bedtime. 30 tablet 11  . NON FORMULARY Stool softeners take daily due to hemorrhoids     Current Facility-Administered Medications on File Prior to Visit  Medication Dose Route Frequency Provider Last Rate Last Dose  . methylPREDNISolone sodium succinate (SOLU-MEDROL) 125 mg/2 mL injection 125 mg  125 mg Intravenous Once Tereasa Coop, PA-C        No  Known Allergies   Objective:  BP (!) 160/120 (BP Location: Left Arm, Patient Position: Sitting, Cuff Size: Large)   Pulse 96   Temp 98.1 F (36.7 C) (Oral)   Resp 16   Ht 5\' 11"  (1.803 m)   Wt 274 lb 12.8 oz (124.6 kg)   SpO2 99%   BMI 38.33 kg/m  Peak flow reading is 200. Peak flow reading is 250.  Physical Exam  Constitutional: He is oriented to person, place, and time. He appears well-developed and well-nourished.  Cardiovascular: Normal rate and regular rhythm.  Pulmonary/Chest: Effort normal. No respiratory distress. He has wheezes. He  has rhonchi. He has no rales.  Audible wheezing appreciated while speaking.  Improved following breathing treatment.  Wheezes with lung auscultation continue after breathing treatment  Neurological: He is alert and oriented to person, place, and time.  Skin: Skin is warm and dry.  Psychiatric: He has a normal mood and affect. His behavior is normal. Judgment and thought content normal.  Vitals reviewed.  Assessment and Plan :  1. COPD exacerbation ([place]) 2. Wheezing -Patient presents with COPD exacerbation for the past 3 days.  Patient states he feels much better after Solu-Medrol injection and DuoNeb treatment.  Peak flow improved from 200-250.  Will cover with azithromycin and a prednisone taper.  Continue home neb treatments as needed. he has appointment with pulmonologist later this month.  Return to clinic if no improvement or worsening symptoms. - azithromycin (ZITHROMAX) 250 MG tablet; Take 2 tabs PO x 1 dose, then 1 tab PO QD x 4 days  Dispense: 6 tablet; Refill: 0 - predniSONE (DELTASONE) 20 MG tablet; Take 3 PO QAM x3days, 2 PO QAM x3days, 1 PO QAM x3days  Dispense: 18 tablet; Refill: 0 - Check Peak Flow - methylPREDNISolone sodium succinate (SOLU-MEDROL) 125 mg/2 mL injection 125 mg - albuterol (PROVENTIL) (2.5 MG/3ML) 0.083% nebulizer solution 5 mg - ipratropium (ATROVENT) nebulizer solution 0.5 mg   Mercer Pod, PA-C  [facility] 09/28/2017 9:32 AM

## 2017-09-28 NOTE — Patient Instructions (Addendum)
   Start taking azithromycin for the next 5 days.  Take this as directed Start your prednisone taper for the next 9 days.  Take this as directed. Continue using your inhaler as needed every 4-6 hours Come back if your symptoms worsen.  Thank you for coming in today. I hope you feel we met your needs.  Feel free to call PCP if you have any questions or further requests.  Please consider signing up for MyChart if you do not already have it, as this is a great way to communicate with me.  Best,  Whitney McVey, PA-C  IF you received an x-ray today, you will receive an invoice from [facility]. Please contact [facility] at [phone] with questions or concerns regarding your invoice.   IF you received labwork today, you will receive an invoice from [facility]. Please contact [facility] at [phone] with questions or concerns regarding your invoice.   Our billing staff will not be able to assist you with questions regarding bills from these companies.  You will be contacted with the lab results as soon as they are available. The fastest way to get your results is to activate your My Chart account. Instructions are located on the last page of this paperwork. If you have not heard from [place] regarding the results in 2 weeks, please contact this office.

## 2017-10-14 ENCOUNTER — Ambulatory Visit: Payer: BLUE CROSS/BLUE SHIELD | Admitting: Internal Medicine

## 2017-10-20 ENCOUNTER — Other Ambulatory Visit: Payer: Self-pay | Admitting: Physician Assistant

## 2017-10-20 DIAGNOSIS — G43109 Migraine with aura, not intractable, without status migrainosus: Secondary | ICD-10-CM

## 2017-10-21 NOTE — Telephone Encounter (Signed)
Refill request for imitrex / LOV 03/02/17 for this medication / Last filled:  Disp Refills Start End   SUMAtriptan (IMITREX) 100 MG tablet 9 tablet 3 03/03/2017    Sig: TAKE ONE-HALF TO ONE TABLET BY MOUTH AS NEEDED FOR MIGRAINE. MAX 200 MG IN 24 HOURS.   Sent to pharmacy as: SUMAtriptan (IMITREX) 100 MG tablet    / Sending to provider for approval

## 2017-10-24 ENCOUNTER — Other Ambulatory Visit: Payer: Self-pay | Admitting: Physician Assistant

## 2017-10-24 DIAGNOSIS — K219 Gastro-esophageal reflux disease without esophagitis: Secondary | ICD-10-CM

## 2017-10-24 NOTE — Telephone Encounter (Signed)
Patient requesting Imitrex last office visit 7 months ago. Please advise, thank you.

## 2017-10-26 ENCOUNTER — Ambulatory Visit: Payer: BLUE CROSS/BLUE SHIELD | Admitting: Physician Assistant

## 2017-10-26 ENCOUNTER — Encounter: Payer: Self-pay | Admitting: Physician Assistant

## 2017-10-26 VITALS — BP 134/100 | HR 104 | Temp 98.6°F | Resp 16 | Ht 71.0 in | Wt 273.8 lb

## 2017-10-26 DIAGNOSIS — M25511 Pain in right shoulder: Secondary | ICD-10-CM | POA: Diagnosis not present

## 2017-10-26 DIAGNOSIS — B370 Candidal stomatitis: Secondary | ICD-10-CM | POA: Diagnosis not present

## 2017-10-26 DIAGNOSIS — G8929 Other chronic pain: Secondary | ICD-10-CM

## 2017-10-26 DIAGNOSIS — F5101 Primary insomnia: Secondary | ICD-10-CM

## 2017-10-26 MED ORDER — ALPRAZOLAM 2 MG PO TABS: 2.0000 mg | ORAL_TABLET | Freq: Every evening | ORAL | 5 refills | Status: AC | PRN

## 2017-10-26 MED ORDER — CHLORHEXIDINE GLUCONATE 0.12% ORAL RINSE (MEDLINE KIT): 15.0000 mL | Freq: Two times a day (BID) | OROMUCOSAL | 3 refills | Status: DC

## 2017-10-26 NOTE — Progress Notes (Signed)
11/01/2017 12:39 PM   DOB: 04-24-1965 / MRN: [id number]  SUBJECTIVE:  Derek Pacheco is a 52 y.o. male presenting for right shoulder pain. Symptoms present for years.  The problem is waxes and wanes. He has tried shoulder injection in the past and this has helped. He would like this again today.   He continues to sleep poorly with 1 mg xanax.  I have tried every other medication for sleeping available and he fails this.  He was diagnosed with sleep apnea and wears his mask as instructed anytime he sleeps.  He is requesting 2 mg xanax today because that is what worked for him in the past.  This was carried by Dr. Joseph Art.    He uses ICS daily for control of COPD.  He suffers from mouth irritation and thrush often.  He would like a solution for this.    He has No Known Allergies.   He  has a past medical history of Allergy, Asthma, Bronchitis, GERD (gastroesophageal reflux disease), Insomnia, Migraines, and Sleep apnea.    He  reports that he quit smoking about 11 years ago. His smoking use included cigarettes. He has a 25.00 pack-year smoking history. He has never used smokeless tobacco. He reports that he drinks alcohol. He reports that he does not use drugs. He  reports that he currently engages in sexual activity. He reports using the following method of birth control/protection: None. The patient  has a past surgical history that includes Knee surgery (Right).  His family history includes Asthma in his mother.  Review of Systems  Constitutional: Negative for chills and fever.  HENT: Negative for sore throat.   Respiratory: Negative for cough and shortness of breath.   Cardiovascular: Negative for chest pain and leg swelling.  Musculoskeletal: Positive for joint pain.  Skin: Negative for itching and rash.  Neurological: Negative for dizziness.    The problem list and medications were reviewed and updated by myself where necessary and exist elsewhere in the encounter.    OBJECTIVE:  BP (!) 134/100   Pulse (!) 104   Temp 98.6 F (37 C)   Resp 16   Ht '5\' 11"'$  (1.803 m)   Wt 273 lb 12.8 oz (124.2 kg)   SpO2 96%   BMI 38.19 kg/m   Wt Readings from Last 3 Encounters:  10/26/17 273 lb 12.8 oz (124.2 kg)  09/28/17 274 lb 12.8 oz (124.6 kg)  08/31/17 278 lb 9.6 oz (126.4 kg)   Temp Readings from Last 3 Encounters:  10/26/17 98.6 F (37 C)  09/28/17 98.1 F (36.7 C) (Oral)  08/31/17 98.1 F (36.7 C) (Oral)   BP Readings from Last 3 Encounters:  10/26/17 (!) 134/100  09/28/17 (!) 150/110  08/31/17 (!) 170/118   Pulse Readings from Last 3 Encounters:  10/26/17 (!) 104  09/28/17 96  08/31/17 97    Physical Exam  Constitutional: He is oriented to person, place, and time. He appears well-developed. He does not appear ill.  Eyes: Pupils are equal, round, and reactive to light. Conjunctivae and EOM are normal.  Cardiovascular: Normal rate.  Pulmonary/Chest: Effort normal.  Abdominal: He exhibits no distension.  Musculoskeletal: Normal range of motion.  Neurological: He is alert and oriented to person, place, and time. No cranial nerve deficit. Coordination normal.  Skin: Skin is warm and dry. He is not diaphoretic.  Psychiatric: He has a normal mood and affect.  Nursing note and vitals reviewed.   Lab Results  Component Value Date   HGBA1C 5.7 (H) 12/17/2016    Lab Results  Component Value Date   WBC 7.1 09/09/2016   HGB 15.3 09/09/2016   HCT 44.1 09/09/2016   MCV 87.6 09/09/2016   PLT 204 07/02/2015    Lab Results  Component Value Date   CREATININE 1.09 09/09/2016   BUN 17 09/09/2016   NA 143 09/09/2016   K 4.2 09/09/2016   CL 105 09/09/2016   CO2 25 09/09/2016    Lab Results  Component Value Date   ALT 50 (H) 09/09/2016   AST 39 09/09/2016   ALKPHOS 77 09/09/2016   BILITOT 0.8 09/09/2016    Lab Results  Component Value Date   TSH 1.903 Test methodology is 3rd generation TSH 05/26/2009    Lab Results   Component Value Date   CHOL 173 12/24/2016   HDL 34 (L) 12/24/2016   LDLCALC 85 12/24/2016   TRIG 271 (H) 12/24/2016   CHOLHDL 5.1 (H) 12/24/2016   Procedure: Right posterior shoulder prepped with Betadine x3.  1.5 inch 25-gauge needle inserted into the shoulder joint using a posterior approach and 1 mL triamcinolone +2 cc of lidocaine injected without any resistance.  Needle removed and bandage placed.  Patient with immediate relief of symptoms.  ASSESSMENT AND PLAN:  Kelen was seen today for shoulder pain.  Diagnoses and all orders for this visit:  Chronic right shoulder pain Comments: Injected here. Patient with immediate relief of pain.   Primary insomnia Comments: I've tried belsomra, trazadone, along with other less risky meds.  He has seen sleep and is compliant with CPAP.  He is miserable due to insomina. Xanax '2mg'$  qhs Orders: -     ALPRAZolam (XANAX) 2 MG tablet; Take 1 tablet (2 mg total) by mouth at bedtime as needed for anxiety. Do not use this without using you CPAP.  Oral thrush Comments: Chronic. 2/2 ICS.  chlorhexidine glucontage has worked well for him in the past.   Other orders -     chlorhexidine gluconate, MEDLINE KIT, (PERIDEX) 0.12 % solution; Use as directed 15 mLs in the mouth or throat 2 (two) times daily.    The patient is advised to call or return to clinic if he does not see an improvement in symptoms, or to seek the care of the closest emergency department if he worsens with the above plan.   Philis Fendt, MHS, PA-C [facility] 11/01/2017 12:39 PM

## 2017-10-27 ENCOUNTER — Ambulatory Visit: Payer: BLUE CROSS/BLUE SHIELD | Admitting: Physician Assistant

## 2017-10-27 ENCOUNTER — Other Ambulatory Visit: Payer: Self-pay | Admitting: Physician Assistant

## 2017-10-27 DIAGNOSIS — K219 Gastro-esophageal reflux disease without esophagitis: Secondary | ICD-10-CM

## 2017-11-04 ENCOUNTER — Ambulatory Visit: Payer: BLUE CROSS/BLUE SHIELD | Admitting: Primary Care

## 2017-11-04 ENCOUNTER — Encounter: Payer: Self-pay | Admitting: Primary Care

## 2017-11-04 DIAGNOSIS — J449 Chronic obstructive pulmonary disease, unspecified: Secondary | ICD-10-CM | POA: Diagnosis not present

## 2017-11-04 MED ORDER — MONTELUKAST SODIUM 10 MG PO TABS: 10.0000 mg | ORAL_TABLET | Freq: Every day | ORAL | 11 refills | Status: AC

## 2017-11-04 MED ORDER — PREDNISONE 20 MG PO TABS: ORAL_TABLET | ORAL | 1 refills | Status: DC

## 2017-11-04 NOTE — Assessment & Plan Note (Addendum)
Experiencing unpredictable COPD exacerbations approximately 3-6 times a year triggered by work enviornement  - Add Singulair 10 mg at bedtime - Prednisone 20mg  qd x 5 days given to have on hand for acute COPD exacerbation with one refill only  - Repeat PFTs in 6 months with follow-up visit  - Consider in the future adding Daliresp and/or a different LAMA to therapy based on the fact that his FEV1 <50 and that he is experiencing random and frequent exacerbations

## 2017-11-04 NOTE — Patient Instructions (Addendum)
Start singulair 10mg  daily - sending RX   Prednisone on hand for flare of COPD symptoms   Repeat PFTs please in 6 months with FU visit after with Dr. Chase Caller or Eustaquio Maize NP

## 2017-11-04 NOTE — Progress Notes (Signed)
Thank you for thinking about his plan.  I feel like he is optimized medically and continues to have frequent exacerbations.  I had thought about changing him to all nebs at one point but his insurance would not pay.  Thank you again and appreciate any recs you may have.  Philis Fendt, MS, PA-C 5:54 PM, 11/04/2017

## 2017-11-04 NOTE — Progress Notes (Signed)
_0  ID: Derek Pacheco, male    DOB: 08/04/1965, 52 y.o.   MRN: [id number]  Chief Complaint  Patient presents with  . Follow-up    breathing is under control    Referring provider: Hillis Pacheco  HPI: 52 year old male, former smoker.  History of COPD and asthma.  Patient of Dr. Lynford Pacheco, last seen by Derek Pacheco  in November 2018.   PFTs 10/2015: FVC 3.29 (61%), FEV1 1.60 (38%), Ratio 49 (63%)  11/04/2017 Presents today for routine follow-up.  He has been doing well, breathing is good. Continues with Symbicort 160. Has not required nebulizer treatment in 3 to 4 weeks.  He did have a recent COPD exacerbation in July treated by his PCP office with Solu-Medrol injection, abx and prednisone.  He has been experiencing approximately 3-6 exacerbations a year.  These flares have been managed by PCP office d/t convenience and availability. States that his symptoms come on fast and that he always requires a steroid injection.   Patient works in [employer] and that it's difficult to predict when his symptoms will flare. Feels that his triggers are sometimes unavoidable. 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 He has tried Incruse in the past with no reported benefit to his breathing.  No Known Allergies   Immunization History  Administered Date(s) Administered  . Influenza,inj,Quad PF,6+ Mos 12/12/2015, 12/17/2016  . Pneumococcal Polysaccharide-23 02/24/2016  . Tdap 05/31/2012    Past Medical History:  Diagnosis Date  . Allergy   . Asthma   . Bronchitis   . GERD (gastroesophageal reflux disease)   . Insomnia   . Migraines   . Sleep apnea     Tobacco History: Social History   Tobacco Use  Smoking Status Former Smoker  . Packs/day: 1.00  . Years: 25.00  . Pack years: 25.00  . Types: Cigarettes  . Last attempt to quit: 03/29/2006  . Years  since quitting: 11.6  Smokeless Tobacco Never Used   Counseling given: Not Answered   Outpatient Medications Prior to Visit  Medication Sig Dispense Refill  . acyclovir ointment (ZOVIRAX) 5 % USE EVERY 4 TO 6 HOURS AS NEEDED FOR COLD SORES 15 g 2  . albuterol (PROVENTIL) (2.5 MG/3ML) 0.083% nebulizer solution Take 3 mLs (2.5 mg total) by nebulization every 6 (six) hours as needed for wheezing. Please place on filer for patient. 75 mL 12  . ALPRAZolam (XANAX) 2 MG tablet Take 1 tablet (2 mg total) by mouth at bedtime as needed for anxiety. Do not use this without using you CPAP. 30 tablet 5  . budesonide-formoterol (SYMBICORT) 160-4.5 MCG/ACT inhaler Inhale 2 puffs into the lungs 2 (two) times daily. 1 Inhaler 12  . chlorhexidine gluconate, MEDLINE KIT, (PERIDEX) 0.12 % solution Use as directed 15 mLs in the mouth or throat 2 (two) times daily. 120 mL 3  . fluticasone (FLONASE) 50 MCG/ACT nasal spray Place 2 sprays into both nostrils daily as needed for allergies or rhinitis.    Marland Kitchen ipratropium (ATROVENT) 0.02 % nebulizer solution Take 2.5 mLs (0.5 mg total) by nebulization 4 (four) times daily. 75 mL 18  . NON FORMULARY Stool softeners take daily due to hemorrhoids    . pantoprazole (PROTONIX) 40 MG tablet TAKE 1 TABLET BY MOUTH ONCE DAILY *ALWAYS TAKE ON AN EMPTY STOMACH IN THE MORNING  AND EAT SOMETHING 30 MINUTES AFTER TAKING* 90 tablet 0  . Peak Flow Meter (AIRZONE PEAK FLOW METER) DEVI Please use daily to keep track of your breathing. 1 each 0  . SUMAtriptan (IMITREX) 100 MG tablet TAKE ONE-HALF TO ONE TABLET BY MOUTH AS NEEDED FOR MIGRAINE. MAX 200 MG IN 24 HOURS. 9 tablet 3  . SUMAtriptan (IMITREX) 100 MG tablet TAKE 1/2 TO 1 (ONE-HALF TO ONE) TABLET BY MOUTH AS NEEDED FOR  MIGRAINE -- **MAXIMUM OF 200 MG IN 24 HOURS** 9 tablet 5  . topiramate (TOPAMAX) 50 MG tablet Take 1 tablet (50 mg total) by mouth at bedtime. 30 tablet 11  . nystatin (MYCOSTATIN) 100000 UNIT/ML suspension Take 5 mLs  (500,000 Units total) by mouth 4 (four) times daily. Nystatin swish and swallow. 60 mL 0  . predniSONE (DELTASONE) 20 MG tablet Take 3 PO QAM x3days, 2 PO QAM x3days, 1 PO QAM x3days 18 tablet 0  . azithromycin (ZITHROMAX) 250 MG tablet Take 2 tabs PO x 1 dose, then 1 tab PO QD x 4 days (Patient not taking: Reported on 11/04/2017) 6 tablet 0   Facility-Administered Medications Prior to Visit  Medication Dose Route Frequency Provider Last Rate Last Dose  . methylPREDNISolone sodium succinate (SOLU-MEDROL) 125 mg/2 mL injection 125 mg  125 mg Intravenous Once Tereasa Coop, PA-C        Review of Systems  Review of Systems  Constitutional: Negative.   HENT: Negative.   Respiratory: Negative.   Cardiovascular: Negative.   Gastrointestinal: Negative.   Genitourinary: Negative.   Skin: Negative.   Neurological: Negative.   Psychiatric/Behavioral: Negative.     Physical Exam  BP 138/80 (BP Location: Right Arm, Cuff Size: Normal)   Pulse 88   Ht 6' (1.829 m)   Wt 271 lb 6.4 oz (123.1 kg)   SpO2 94%   BMI 36.81 kg/m  Physical Exam  Constitutional: He is oriented to person, place, and time. He appears well-developed and well-nourished.  HENT:  Head: Normocephalic and atraumatic.  Eyes: Pupils are equal, round, and reactive to light. EOM are normal.  Neck: Normal Pacheco of motion. Neck supple.  Cardiovascular: Normal rate, regular rhythm, normal heart sounds and intact distal pulses.  Pulmonary/Chest: Effort normal and breath sounds normal. No respiratory distress. He has no wheezes.  Abdominal: Soft. Bowel sounds are normal. There is no tenderness.  Neurological: He is alert and oriented to person, place, and time.  Skin: Skin is warm and dry. No rash noted. No erythema.  Psychiatric: He has a normal mood and affect. His behavior is normal. Judgment normal.     Lab Results:  CBC    Component Value Date/Time   WBC 7.1 09/09/2016 1842   WBC 6.6 07/02/2015 0450   RBC 5.03  09/09/2016 1842   RBC 4.84 07/02/2015 0450   HGB 15.3 09/09/2016 1842   HGB 14.4 07/02/2015 0450   HCT 44.1 09/09/2016 1842   HCT 42.8 07/02/2015 0450   PLT 204 07/02/2015 0450   MCV 87.6 09/09/2016 1842   MCH 30.4 09/09/2016 1842   MCH 29.8 07/02/2015 0450   MCHC 34.7 09/09/2016 1842   MCHC 33.6 07/02/2015 0450   RDW 12.7 07/02/2015 0450   LYMPHSABS 1.9 05/30/2009 2035   MONOABS 1.0 05/30/2009 2035   EOSABS 0.0 05/30/2009 2035   BASOSABS 0.0 05/30/2009 2035    BMET    Component Value Date/Time   NA 143 09/09/2016 1816   K 4.2 09/09/2016 1816  CL 105 09/09/2016 1816   CO2 25 09/09/2016 1816   GLUCOSE 100 (H) 09/09/2016 1816   GLUCOSE 237 (H) 07/02/2015 0450   BUN 17 09/09/2016 1816   CREATININE 1.09 09/09/2016 1816   CALCIUM 9.6 09/09/2016 1816   GFRNONAA 78 09/09/2016 1816   GFRAA 90 09/09/2016 1816    BNP    Component Value Date/Time   BNP 21.0 07/01/2015 2216    ProBNP    Component Value Date/Time   PROBNP <30.0 05/25/2009 1522    Imaging: No results found.   Assessment & Plan:   COPD (chronic obstructive pulmonary disease) ([employer]) Experiencing unpredictable COPD exacerbations approximately 3-6 times a year triggered by work enviornement  - Add Singulair 10 mg at bedtime - Prednisone 75m qd x 5 days given to have on hand for acute COPD exacerbation with one refill only  - Repeat PFTs in 6 months with follow-up visit  - Consider in the future adding Daliresp and/or a different LAMA to therapy based on the fact that his FEV1 <50 and that he is experiencing random and frequent exacerbations      EMartyn Ehrich NP 11/04/2017

## 2017-12-23 IMAGING — NM NM MISC PROCEDURE
3 series · 18 of 18 positions shown · non-contrast
Comparison: none

[Series 1: wbr_s-proj_st stress_(id)_sa · 6.5mm · 6.51mm/px · 6 of 512 frames shown (1 of 2)]
[frame 43/512]
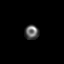
[frame 128/512]
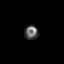
[frame 214/512]
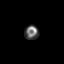
[frame 299/512]
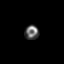
[frame 384/512]
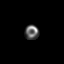
[frame 470/512]
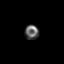

[Series 1: wbr_s-proj_st stress_(id)_sa · 6.5mm · 6.51mm/px · 6 of 64 frames shown (2 of 2)]
[frame 6/64]
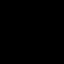
[frame 16/64]
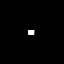
[frame 27/64]
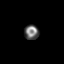
[frame 38/64]
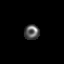
[frame 48/64]
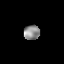
[frame 59/64]
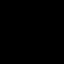

[Series 1: wbr_r-proj_st rest_(id)_sa · 6.5mm · 6.51mm/px · 6 of 64 frames shown]
[frame 6/64]
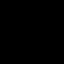
[frame 16/64]
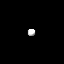
[frame 27/64]
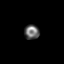
[frame 38/64]
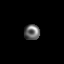
[frame 48/64]
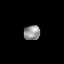
[frame 59/64]
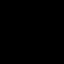

[18 of 18 positions shown; findings below may reference images not displayed]

Canned report from images found in remote index.

Refer to host system for actual result text.

## 2017-12-25 ENCOUNTER — Encounter: Payer: Self-pay | Admitting: Adult Health

## 2017-12-27 ENCOUNTER — Encounter: Payer: Self-pay | Admitting: Adult Health

## 2017-12-27 ENCOUNTER — Ambulatory Visit: Payer: BLUE CROSS/BLUE SHIELD | Admitting: Adult Health

## 2017-12-27 VITALS — BP 145/96 | HR 85 | Ht 72.0 in | Wt 284.0 lb

## 2017-12-27 DIAGNOSIS — Z9989 Dependence on other enabling machines and devices: Secondary | ICD-10-CM

## 2017-12-27 DIAGNOSIS — G4733 Obstructive sleep apnea (adult) (pediatric): Secondary | ICD-10-CM

## 2017-12-27 NOTE — Progress Notes (Addendum)
PATIENT: Derek Pacheco DOB: 10-12-1965  REASON FOR VISIT: follow up HISTORY FROM: patient  HISTORY OF PRESENT ILLNESS: Today 12/27/17: Derek Pacheco is a 52 year old male with a history of obstructive sleep apnea on CPAP.  He returns today for follow-up.  His download indicates that he uses machine 28 out of 30 days for compliance of 93%.  He uses machine greater than 4 hours 20 days for compliance of 87%.  On average he uses his machine 5 hours and 53 minutes.  His residual AHI is 1.9 on 7 cm of water with EPR of 3.  His leak in the 95th percentile is 35.2 L/min.  He states his leak is from his facial hair.  He does report that he needs to trim his beard and typically that helps the leak.  Overall he feels that he is doing well.  He returns today for evaluation.   HISTORY 06/15/2017: I reviewed his CPAP compliance data from 05/15/2017 through 06/13/2017 which is a total of 30 days, during which time he used his CPAP every night with percent used days greater than 4 hours at 83%, indicating very good compliance with an average usage of 5 hours and 17 minutes, residual AHI at 2.6 per hour, leak acceptable with the 95th percentile at 15.2 L/m on a pressure of 7 cm with EPR of 3. He reports not feeling that much difference in his sleep-related issues, but is overall motivated to continue with CPAP. He is compliant with treatment. Weight has been up and down. He is no longer on trazodone and takes xanax 1 mg qHS  REVIEW OF SYSTEMS: Out of a complete 14 system review of symptoms, the patient complains only of the following symptoms, and all other reviewed systems are negative.  See HPI  ESS 10 [id number]  ALLERGIES: No Known Allergies  HOME MEDICATIONS: Outpatient Medications Prior to Visit  Medication Sig Dispense Refill  . acyclovir ointment (ZOVIRAX) 5 % USE EVERY 4 TO 6 HOURS AS NEEDED FOR COLD SORES 15 g 2  . albuterol (PROVENTIL) (2.5 MG/3ML) 0.083% nebulizer solution Take 3 mLs (2.5 mg  total) by nebulization every 6 (six) hours as needed for wheezing. Please place on filer for patient. 75 mL 12  . ALPRAZolam (XANAX) 2 MG tablet Take 1 tablet (2 mg total) by mouth at bedtime as needed for anxiety. Do not use this without using you CPAP. 30 tablet 5  . azithromycin (ZITHROMAX) 250 MG tablet Take 2 tabs PO x 1 dose, then 1 tab PO QD x 4 days 6 tablet 0  . budesonide-formoterol (SYMBICORT) 160-4.5 MCG/ACT inhaler Inhale 2 puffs into the lungs 2 (two) times daily. 1 Inhaler 12  . chlorhexidine gluconate, MEDLINE KIT, (PERIDEX) 0.12 % solution Use as directed 15 mLs in the mouth or throat 2 (two) times daily. 120 mL 3  . fluticasone (FLONASE) 50 MCG/ACT nasal spray Place 2 sprays into both nostrils daily as needed for allergies or rhinitis.    Marland Kitchen ipratropium (ATROVENT) 0.02 % nebulizer solution Take 2.5 mLs (0.5 mg total) by nebulization 4 (four) times daily. 75 mL 18  . montelukast (SINGULAIR) 10 MG tablet Take 1 tablet (10 mg total) by mouth at bedtime. 30 tablet 11  . NON FORMULARY Stool softeners take daily due to hemorrhoids    . pantoprazole (PROTONIX) 40 MG tablet TAKE 1 TABLET BY MOUTH ONCE DAILY *ALWAYS TAKE ON AN EMPTY STOMACH IN THE MORNING AND EAT SOMETHING 30 MINUTES AFTER TAKING* 90 tablet  0  . Peak Flow Meter (AIRZONE PEAK FLOW METER) DEVI Please use daily to keep track of your breathing. 1 each 0  . predniSONE (DELTASONE) 20 MG tablet Take 1 tab daily x 5 days prn COPD exacerbation symptoms 5 tablet 1  . SUMAtriptan (IMITREX) 100 MG tablet TAKE ONE-HALF TO ONE TABLET BY MOUTH AS NEEDED FOR MIGRAINE. MAX 200 MG IN 24 HOURS. 9 tablet 3  . SUMAtriptan (IMITREX) 100 MG tablet TAKE 1/2 TO 1 (ONE-HALF TO ONE) TABLET BY MOUTH AS NEEDED FOR  MIGRAINE -- **MAXIMUM OF 200 MG IN 24 HOURS** 9 tablet 5  . topiramate (TOPAMAX) 50 MG tablet Take 1 tablet (50 mg total) by mouth at bedtime. 30 tablet 11   Facility-Administered Medications Prior to Visit  Medication Dose Route Frequency  Provider Last Rate Last Dose  . methylPREDNISolone sodium succinate (SOLU-MEDROL) 125 mg/2 mL injection 125 mg  125 mg Intravenous Once [name]        PAST MEDICAL HISTORY: Past Medical History:  Diagnosis Date  . Allergy   . Asthma   . Bronchitis   . GERD (gastroesophageal reflux disease)   . Insomnia   . Migraines   . Sleep apnea     PAST SURGICAL HISTORY: Past Surgical History:  Procedure Laterality Date  . KNEE SURGERY Right    2017    FAMILY HISTORY: Family History  Problem Relation Age of Onset  . Asthma Mother   . Colon cancer Neg Hx   . Colon polyps Neg Hx   . Esophageal cancer Neg Hx   . Rectal cancer Neg Hx   . Stomach cancer Neg Hx     SOCIAL HISTORY: Social History   Socioeconomic History  . Marital status: Significant Other    Spouse name: Not on file  . Number of children: Not on file  . Years of education: Not on file  . Highest education level: Not on file  Occupational History  . Not on file  Social Needs  . Financial resource strain: Not on file  . Food insecurity:    Worry: Not on file    Inability: Not on file  . Transportation needs:    Medical: Not on file    Non-medical: Not on file  Tobacco Use  . Smoking status: Former Smoker    Packs/day: 1.00    Years: 25.00    Pack years: 25.00    Types: Cigarettes    Last attempt to quit: 03/29/2006    Years since quitting: 11.7  . Smokeless tobacco: Never Used  Substance and Sexual Activity  . Alcohol use: Yes    Alcohol/week: 0.0 standard drinks    Comment: rarely- nothing for a few years  . Drug use: No  . Sexual activity: Yes    Birth control/protection: None  Lifestyle  . Physical activity:    Days per week: Not on file    Minutes per session: Not on file  . Stress: Not on file  Relationships  . Social connections:    Talks on phone: Not on file    Gets together: Not on file    Attends religious service: Not on file    Active member of club or organization:  Not on file    Attends meetings of clubs or organizations: Not on file    Relationship status: Not on file  . Intimate partner violence:    Fear of current or ex partner: Not on file    Emotionally  abused: Not on file    Physically abused: Not on file    Forced sexual activity: Not on file  Other Topics Concern  . Not on file  Social History Narrative  . Not on file      PHYSICAL EXAM  Vitals:   12/27/17 1341  BP: (!) 145/96  Pulse: 85  Weight: 284 lb (128.8 kg)  Height: 6' (1.829 m)   Body mass index is 38.52 kg/m.  Generalized: Well developed, in no acute distress   Neurological examination  Mentation: Alert oriented to time, place, history taking. Follows all commands speech and language fluent Cranial nerve II-XII: Pupils were equal round reactive to light. Extraocular movements were full, visual field were full on confrontational test. Facial sensation and strength were normal. Uvula tongue midline. Head turning and shoulder shrug  were normal and symmetric.  Neck circumference 19 inches, Mallampati 4+ Motor: The motor testing reveals 5 over 5 strength of all 4 extremities. Good symmetric motor tone is noted throughout.  Sensory: Sensory testing is intact to soft touch on all 4 extremities. No evidence of extinction is noted.  Coordination: Cerebellar testing reveals good finger-nose-finger and heel-to-shin bilaterally.  Gait and station: Gait is normal. Tandem gait is normal. Romberg is negative. No drift is seen.  Reflexes: Deep tendon reflexes are symmetric and normal bilaterally.   DIAGNOSTIC DATA (LABS, IMAGING, TESTING) - I reviewed patient records, labs, notes, testing and imaging myself where available.  Lab Results  Component Value Date   WBC 7.1 09/09/2016   HGB 15.3 09/09/2016   HCT 44.1 09/09/2016   MCV 87.6 09/09/2016   PLT 204 07/02/2015      Component Value Date/Time   NA 143 09/09/2016 1816   K 4.2 09/09/2016 1816   CL 105 09/09/2016 1816    CO2 25 09/09/2016 1816   GLUCOSE 100 (H) 09/09/2016 1816   GLUCOSE 237 (H) 07/02/2015 0450   BUN 17 09/09/2016 1816   CREATININE 1.09 09/09/2016 1816   CALCIUM 9.6 09/09/2016 1816   PROT 7.3 09/09/2016 1816   ALBUMIN 4.9 09/09/2016 1816   AST 39 09/09/2016 1816   ALT 50 (H) 09/09/2016 1816   ALKPHOS 77 09/09/2016 1816   BILITOT 0.8 09/09/2016 1816   GFRNONAA 78 09/09/2016 1816   GFRAA 90 09/09/2016 1816   Lab Results  Component Value Date   CHOL 173 12/24/2016   HDL 34 (L) 12/24/2016   LDLCALC 85 12/24/2016   TRIG 271 (H) 12/24/2016   CHOLHDL 5.1 (H) 12/24/2016   Lab Results  Component Value Date   HGBA1C 5.7 (H) 12/17/2016   No results found for: [place] PLAN 52 y.o. year old male  has a past medical history of Allergy, Asthma, Bronchitis, GERD (gastroesophageal reflux disease), Insomnia, Migraines, and Sleep apnea. here with:  1.  Obstructive sleep apnea on CPAP  Overall the patient is doing well.  His download shows good compliance and treatment of his apnea.  He is encouraged to continue using the CPAP nightly and greater than 4 hours each night.  We will follow-up in 1 year or sooner if needed.   I spent 15 minutes with the patient. 50% of this time was spent reviewing his CPAP download   Ward Givens, MSN, NP-C 12/27/2017, 2:06 PM [facility] [address], [place],  [address] [phone]  I reviewed the above note and documentation by the Nurse Practitioner and agree with the history, physical exam,  assessment and plan as outlined above. I was immediately available for face-to-face consultation. Star Age, MD, PhD [facility])

## 2017-12-27 NOTE — Patient Instructions (Signed)
Your Plan:  Continue using CPAP nightly If your symptoms worsen or you develop new symptoms please let us know.   Thank you for coming to see us at [facility]. I hope we have been able to provide you high quality care today.  You may receive a patient satisfaction survey over the next few weeks. We would appreciate your feedback and comments so that we may continue to improve ourselves and the health of our patients.  

## 2018-01-28 ENCOUNTER — Other Ambulatory Visit: Payer: Self-pay | Admitting: Physician Assistant

## 2018-01-28 DIAGNOSIS — K219 Gastro-esophageal reflux disease without esophagitis: Secondary | ICD-10-CM

## 2018-01-31 DIAGNOSIS — G4733 Obstructive sleep apnea (adult) (pediatric): Secondary | ICD-10-CM | POA: Diagnosis not present

## 2018-03-02 DIAGNOSIS — G4733 Obstructive sleep apnea (adult) (pediatric): Secondary | ICD-10-CM | POA: Diagnosis not present

## 2018-04-02 DIAGNOSIS — G4733 Obstructive sleep apnea (adult) (pediatric): Secondary | ICD-10-CM | POA: Diagnosis not present

## 2018-04-05 ENCOUNTER — Ambulatory Visit (INDEPENDENT_AMBULATORY_CARE_PROVIDER_SITE_OTHER): Payer: BLUE CROSS/BLUE SHIELD | Admitting: Physician Assistant

## 2018-04-05 ENCOUNTER — Other Ambulatory Visit: Payer: Self-pay

## 2018-04-05 ENCOUNTER — Encounter: Payer: Self-pay | Admitting: Physician Assistant

## 2018-04-05 VITALS — BP 160/116 | HR 94 | Temp 99.2°F | Resp 20 | Ht 72.0 in | Wt 264.8 lb

## 2018-04-05 DIAGNOSIS — J441 Chronic obstructive pulmonary disease with (acute) exacerbation: Secondary | ICD-10-CM | POA: Diagnosis not present

## 2018-04-05 DIAGNOSIS — J449 Chronic obstructive pulmonary disease, unspecified: Secondary | ICD-10-CM | POA: Diagnosis not present

## 2018-04-05 DIAGNOSIS — R062 Wheezing: Secondary | ICD-10-CM | POA: Diagnosis not present

## 2018-04-05 MED ORDER — DOXYCYCLINE HYCLATE 100 MG PO TABS: 100.0000 mg | ORAL_TABLET | Freq: Two times a day (BID) | ORAL | 0 refills | Status: DC

## 2018-04-05 MED ORDER — ALBUTEROL SULFATE (2.5 MG/3ML) 0.083% IN NEBU
2.5000 mg | INHALATION_SOLUTION | Freq: Once | RESPIRATORY_TRACT | Status: AC
  Administered 2018-04-05: 2.5 mg via RESPIRATORY_TRACT

## 2018-04-05 MED ORDER — PREDNISONE 20 MG PO TABS: ORAL_TABLET | ORAL | 0 refills | Status: DC

## 2018-04-05 MED ORDER — METHYLPREDNISOLONE SODIUM SUCC 125 MG IJ SOLR
125.0000 mg | Freq: Once | INTRAMUSCULAR | Status: AC
  Administered 2018-04-05: 125 mg via INTRAMUSCULAR

## 2018-04-05 MED ORDER — IPRATROPIUM BROMIDE 0.02 % IN SOLN
0.5000 mg | Freq: Once | RESPIRATORY_TRACT | Status: AC
  Administered 2018-04-05: 0.5 mg via RESPIRATORY_TRACT

## 2018-04-05 NOTE — Progress Notes (Signed)
Derek Pacheco  MRN: [id number] DOB: [birth year]  PCP: Tereasa Coop, PA-C  Subjective:  Pt is a 53 year old male who presents to clinic for wheezing and shob x 2 days.  Albuterol breathing treatment at home this morning.  He has a long history of this problem.   Review of Systems  Constitutional: Positive for chills and fever.  Respiratory: Positive for cough, shortness of breath and wheezing. Negative for chest tightness.     Patient Active Problem List   Diagnosis Date Noted  . Elevated LFTs 09/13/2016  . Stopped smoking with greater than 20 pack year history 10/23/2015  . COPD (chronic obstructive pulmonary disease) ([place]) 07/02/2015  . Insomnia 05/10/2012    Current Outpatient Medications on File Prior to Visit  Medication Sig Dispense Refill  . acyclovir ointment (ZOVIRAX) 5 % USE EVERY 4 TO 6 HOURS AS NEEDED FOR COLD SORES 15 g 2  . albuterol (PROVENTIL) (2.5 MG/3ML) 0.083% nebulizer solution Take 3 mLs (2.5 mg total) by nebulization every 6 (six) hours as needed for wheezing. Please place on filer for patient. 75 mL 12  . ALPRAZolam (XANAX) 2 MG tablet Take 1 tablet (2 mg total) by mouth at bedtime as needed for anxiety. Do not use this without using you CPAP. 30 tablet 5  . budesonide-formoterol (SYMBICORT) 160-4.5 MCG/ACT inhaler Inhale 2 puffs into the lungs 2 (two) times daily. 1 Inhaler 12  . chlorhexidine gluconate, MEDLINE KIT, (PERIDEX) 0.12 % solution Use as directed 15 mLs in the mouth or throat 2 (two) times daily. 120 mL 3  . fluticasone (FLONASE) 50 MCG/ACT nasal spray Place 2 sprays into both nostrils daily as needed for allergies or rhinitis.    Marland Kitchen ipratropium (ATROVENT) 0.02 % nebulizer solution Take 2.5 mLs (0.5 mg total) by nebulization 4 (four) times daily. 75 mL 18  . montelukast (SINGULAIR) 10 MG tablet Take 1 tablet (10 mg total) by mouth at bedtime. 30 tablet 11  . NON FORMULARY Stool softeners take daily due to hemorrhoids    . pantoprazole  (PROTONIX) 40 MG tablet TAKE 1 TABLET BY MOUTH ONCE DAILY **TAKE  ON  AN  EMPTY  STOMACH  IN  THE  MORNING  AND  EAT  SOMETHING  30  MINUTES  AFTER  TAKING* 90 tablet 0  . Peak Flow Meter (AIRZONE PEAK FLOW METER) DEVI Please use daily to keep track of your breathing. 1 each 0  . SUMAtriptan (IMITREX) 100 MG tablet TAKE ONE-HALF TO ONE TABLET BY MOUTH AS NEEDED FOR MIGRAINE. MAX 200 MG IN 24 HOURS. 9 tablet 3  . SUMAtriptan (IMITREX) 100 MG tablet TAKE 1/2 TO 1 (ONE-HALF TO ONE) TABLET BY MOUTH AS NEEDED FOR  MIGRAINE -- **MAXIMUM OF 200 MG IN 24 HOURS** 9 tablet 5  . topiramate (TOPAMAX) 50 MG tablet Take 1 tablet (50 mg total) by mouth at bedtime. 30 tablet 11   Current Facility-Administered Medications on File Prior to Visit  Medication Dose Route Frequency Provider Last Rate Last Dose  . methylPREDNISolone sodium succinate (SOLU-MEDROL) 125 mg/2 mL injection 125 mg  125 mg Intravenous Once Derek Fendt L, PA-C        No Known Allergies   Objective:  BP (!) 160/116 (BP Location: Right Arm, Patient Position: Sitting, Cuff Size: Large)   Pulse 94   Temp 99.2 F (37.3 C) (Oral)   Resp 20   Ht 6' (1.829 m)   Wt 264 lb 12.8 oz (120.1 kg)  SpO2 93%   BMI 35.91 kg/m   Physical Exam Vitals signs reviewed.  Constitutional:      General: He is not in acute distress.    Appearance: Normal appearance.  Pulmonary:     Effort: Pulmonary effort is normal.     Breath sounds: Wheezing (diffuse) present.  Neurological:     Mental Status: He is alert.  Psychiatric:        Mood and Affect: Mood normal.        Behavior: Behavior normal.     Assessment and Plan :  1. COPD exacerbation (HCC) - doxycycline (VIBRA-TABS) 100 MG tablet; Take 1 tablet (100 mg total) by mouth 2 (two) times daily.  Dispense: 20 tablet; Refill: 0 - predniSONE (DELTASONE) 20 MG tablet; Take 3 PO QAM x3days, 2 PO QAM x3days, 1 PO QAM x3days  Dispense: 18 tablet; Refill: 0  2. Wheezing - albuterol (PROVENTIL) (2.5  MG/3ML) 0.083% nebulizer solution 2.5 mg - ipratropium (ATROVENT) nebulizer solution 0.5 mg - methylPREDNISolone sodium succinate (SOLU-MEDROL) 125 mg/2 mL injection 125 mg - Check Peak Flow   Mercer Pod, PA-C  [facility] at [place] 04/05/2018 4:42 PM  Please note: Portions of this report may have been transcribed using dragon voice recognition software. Every effort was made to ensure accuracy; however, inadvertent computerized transcription errors may be present.

## 2018-04-05 NOTE — Patient Instructions (Addendum)
Start taking prednisone and doxycycline.  It was great to see you! Please tell Legrand Como I said hello.   Feel better soon!

## 2018-04-13 DIAGNOSIS — Z125 Encounter for screening for malignant neoplasm of prostate: Secondary | ICD-10-CM | POA: Diagnosis not present

## 2018-04-13 DIAGNOSIS — Z13220 Encounter for screening for lipoid disorders: Secondary | ICD-10-CM | POA: Diagnosis not present

## 2018-04-13 DIAGNOSIS — I10 Essential (primary) hypertension: Secondary | ICD-10-CM | POA: Diagnosis not present

## 2018-04-13 DIAGNOSIS — Z1159 Encounter for screening for other viral diseases: Secondary | ICD-10-CM | POA: Diagnosis not present

## 2018-04-13 DIAGNOSIS — Z23 Encounter for immunization: Secondary | ICD-10-CM | POA: Diagnosis not present

## 2018-04-13 DIAGNOSIS — Z114 Encounter for screening for human immunodeficiency virus [HIV]: Secondary | ICD-10-CM | POA: Diagnosis not present

## 2018-04-13 DIAGNOSIS — S6982XA Other specified injuries of left wrist, hand and finger(s), initial encounter: Secondary | ICD-10-CM | POA: Diagnosis not present

## 2018-04-13 DIAGNOSIS — F5101 Primary insomnia: Secondary | ICD-10-CM | POA: Diagnosis not present

## 2018-04-13 DIAGNOSIS — Z131 Encounter for screening for diabetes mellitus: Secondary | ICD-10-CM | POA: Diagnosis not present

## 2018-05-03 ENCOUNTER — Other Ambulatory Visit: Payer: Self-pay | Admitting: Family Medicine

## 2018-05-03 DIAGNOSIS — K219 Gastro-esophageal reflux disease without esophagitis: Secondary | ICD-10-CM

## 2018-05-03 DIAGNOSIS — G4733 Obstructive sleep apnea (adult) (pediatric): Secondary | ICD-10-CM | POA: Diagnosis not present

## 2018-05-04 NOTE — Telephone Encounter (Signed)
Requested Prescriptions  Pending Prescriptions Disp Refills  . pantoprazole (PROTONIX) 40 MG tablet [Pharmacy Med Name: Pantoprazole Sodium 40 MG Oral Tablet Delayed Release] 90 tablet 0    Sig: TAKE 1 TABLET BY MOUTH ONCE DAILY IN THE MORNING ON AN EMPTY STOMACH AND  EAT  SOMETHING  30  MINUTES  AFTER  TAKING     Gastroenterology: Proton Pump Inhibitors Passed - 05/03/2018  9:07 PM      Passed - Valid encounter within last 12 months    Recent Outpatient Visits          4 weeks ago COPD exacerbation [facility])   Primary Care at [facility], Gelene Mink, PA-C   6 months ago Chronic right shoulder pain   [facility] at [place], PA-C   7 months ago COPD exacerbation [facility])   Primary Care at [facility], Gelene Mink, PA-C   8 months ago COPD with acute exacerbation [facility])   Primary [facility] at [place], PA-C   10 months ago COPD exacerbation [facility])   [facility] at [place], PA-C

## 2018-05-18 DIAGNOSIS — I10 Essential (primary) hypertension: Secondary | ICD-10-CM | POA: Diagnosis not present

## 2018-05-18 DIAGNOSIS — G4733 Obstructive sleep apnea (adult) (pediatric): Secondary | ICD-10-CM | POA: Diagnosis not present

## 2018-05-18 DIAGNOSIS — J449 Chronic obstructive pulmonary disease, unspecified: Secondary | ICD-10-CM | POA: Diagnosis not present

## 2018-05-18 DIAGNOSIS — G43909 Migraine, unspecified, not intractable, without status migrainosus: Secondary | ICD-10-CM | POA: Diagnosis not present

## 2018-06-01 DIAGNOSIS — G4733 Obstructive sleep apnea (adult) (pediatric): Secondary | ICD-10-CM | POA: Diagnosis not present

## 2018-07-02 DIAGNOSIS — G4733 Obstructive sleep apnea (adult) (pediatric): Secondary | ICD-10-CM | POA: Diagnosis not present

## 2018-08-01 DIAGNOSIS — G4733 Obstructive sleep apnea (adult) (pediatric): Secondary | ICD-10-CM | POA: Diagnosis not present

## 2018-08-12 IMAGING — DX DG CHEST 2V
2 series · 2 of 2 positions shown · non-contrast
Comparison: 07/01/2015

CLINICAL DATA: Cough with questionable history of asthma and/or
COPD.

EXAM:
CHEST - 2 VIEW

[chest pa]
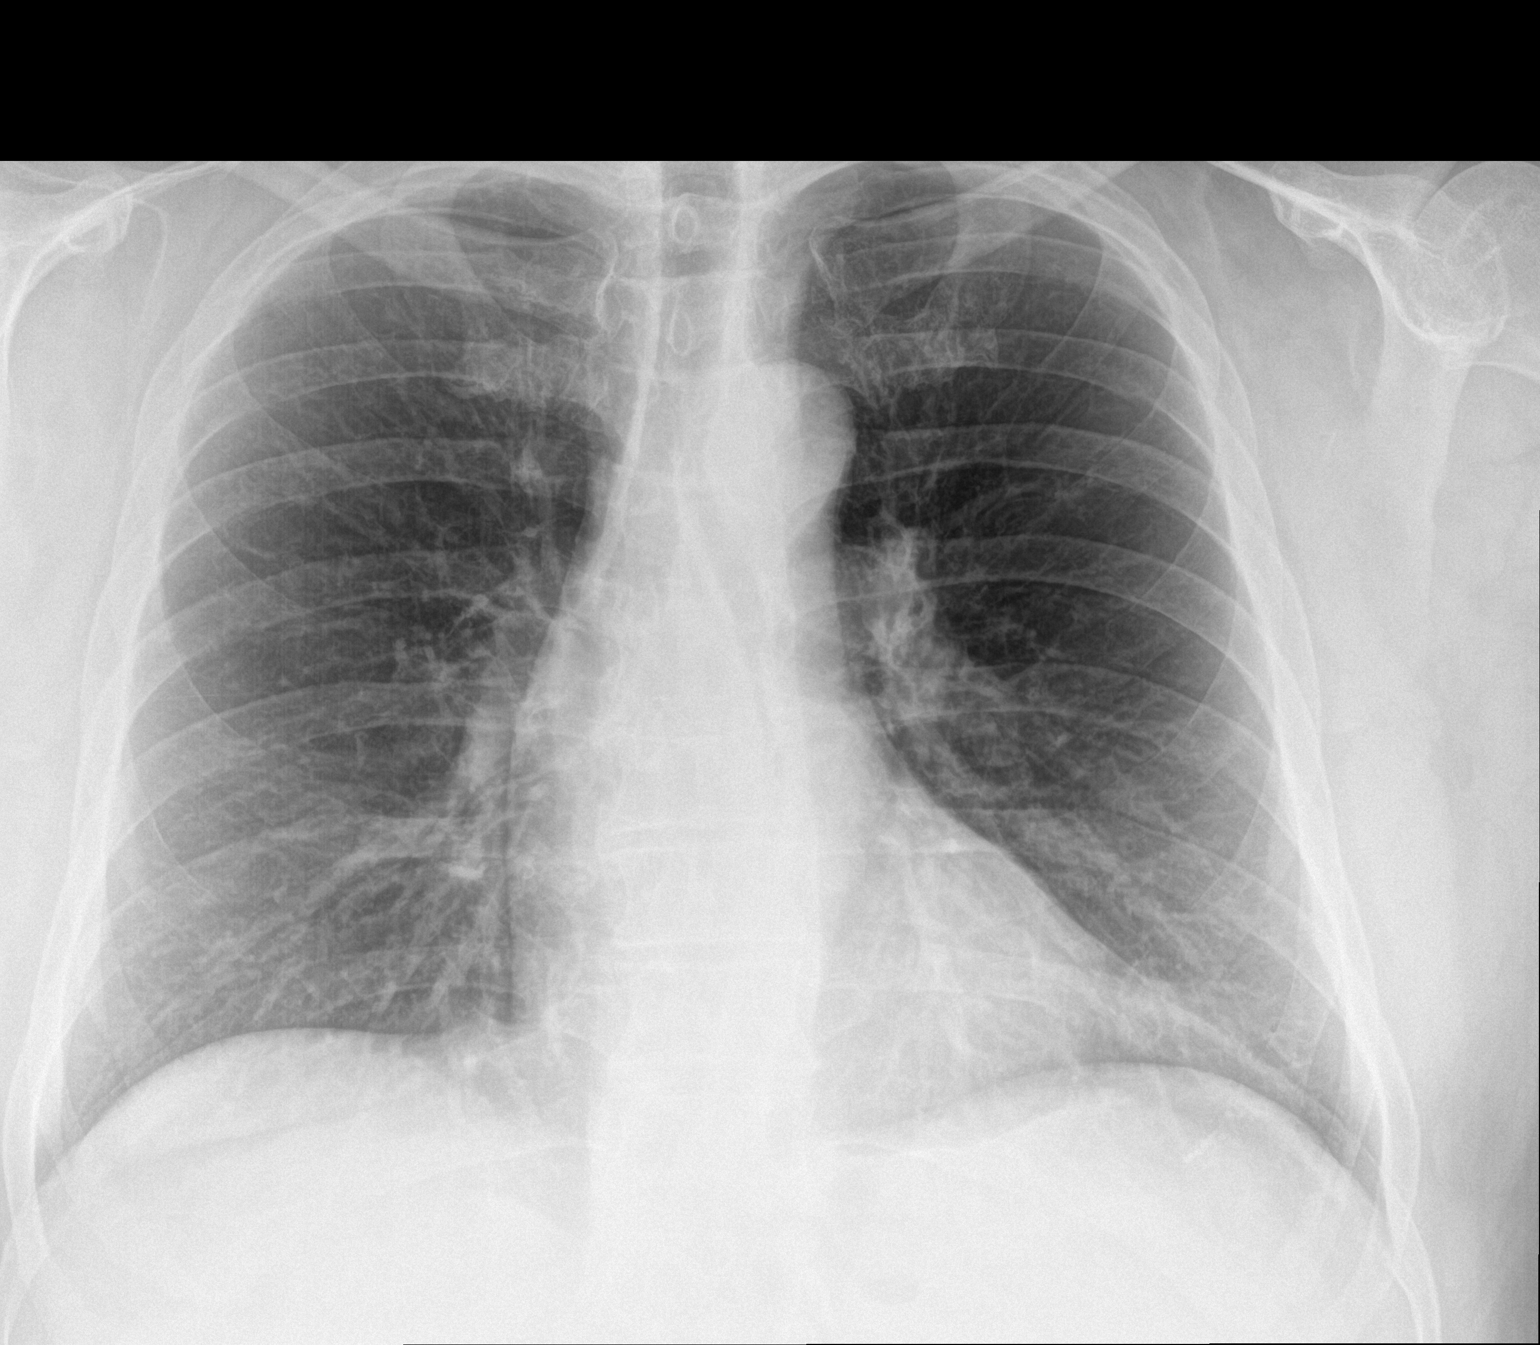

[chest lat]
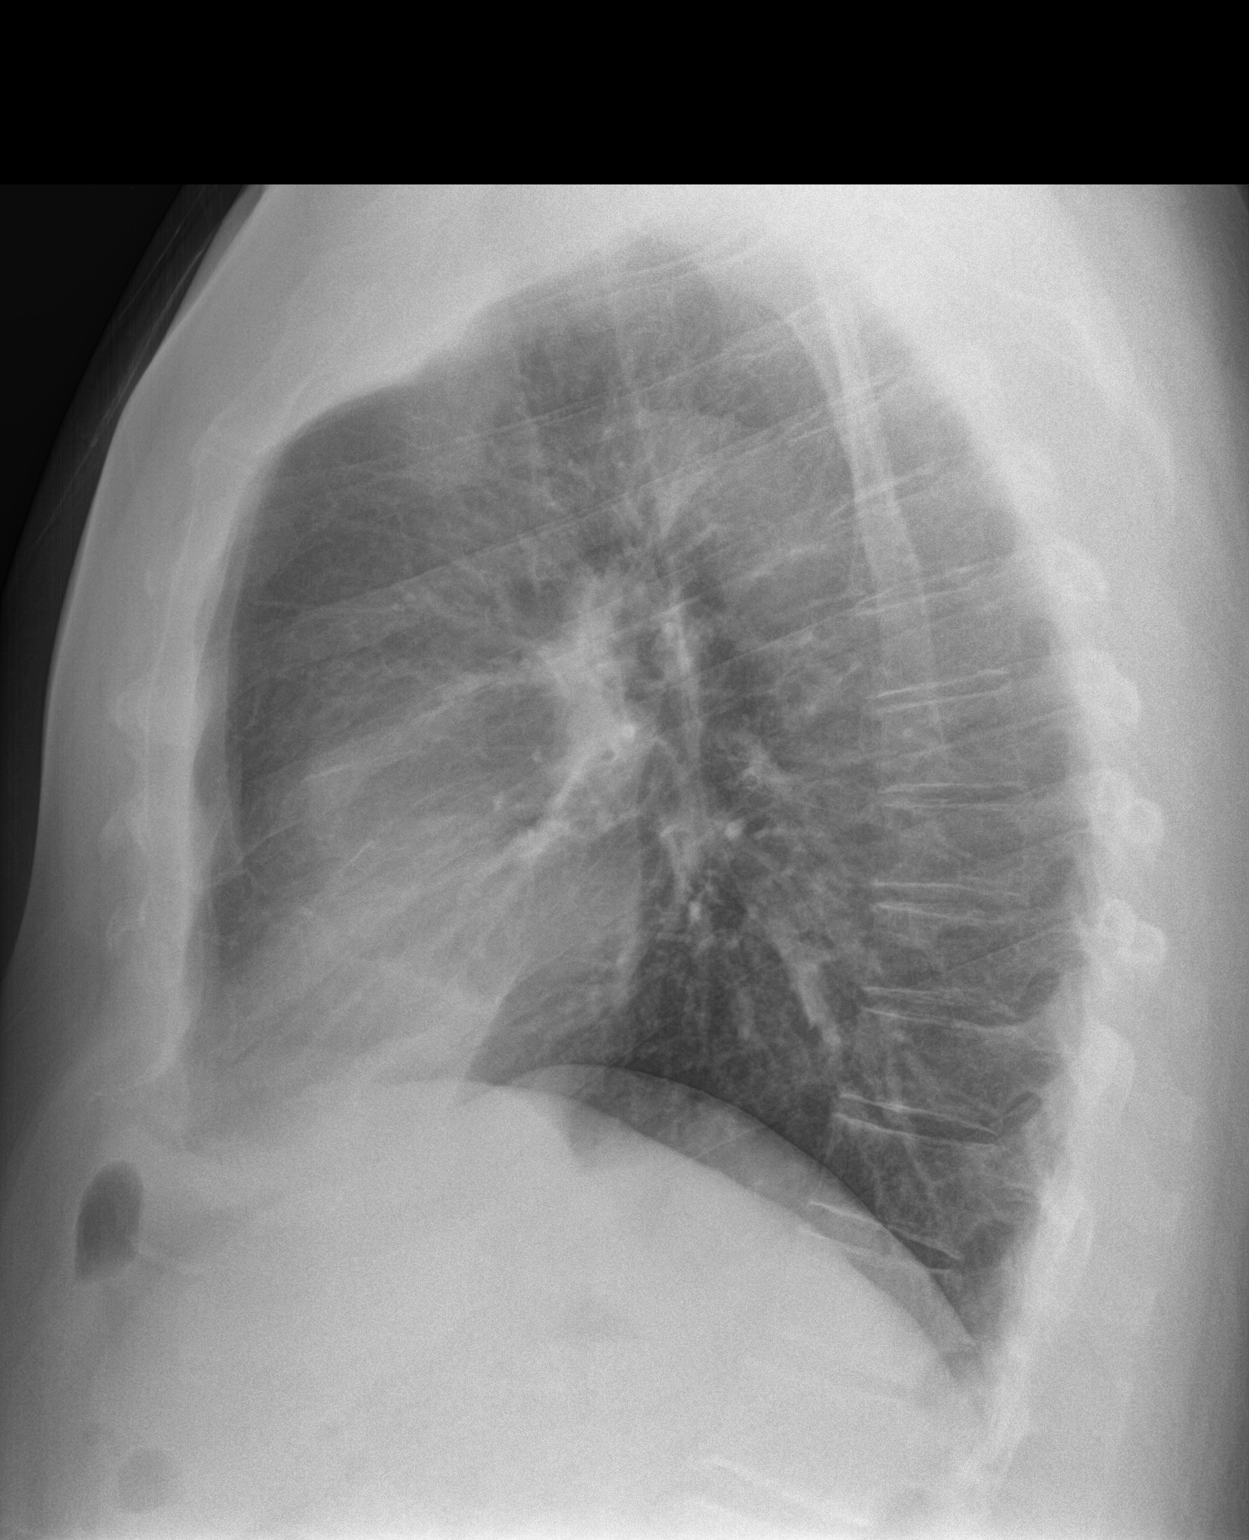

[2 of 2 positions shown; findings below may reference images not displayed]

FINDINGS: Lungs mildly hyperinflated, clear.

Heart size and mediastinal contours are within normal limits.

No effusion.

Visualized bones unremarkable.
IMPRESSION: No acute cardiopulmonary disease.

## 2018-08-17 DIAGNOSIS — H6122 Impacted cerumen, left ear: Secondary | ICD-10-CM | POA: Diagnosis not present

## 2018-08-17 DIAGNOSIS — N529 Male erectile dysfunction, unspecified: Secondary | ICD-10-CM | POA: Diagnosis not present

## 2018-08-17 DIAGNOSIS — I10 Essential (primary) hypertension: Secondary | ICD-10-CM | POA: Diagnosis not present

## 2018-08-25 ENCOUNTER — Other Ambulatory Visit: Payer: Self-pay | Admitting: Physician Assistant

## 2018-08-25 DIAGNOSIS — J449 Chronic obstructive pulmonary disease, unspecified: Secondary | ICD-10-CM

## 2018-09-02 ENCOUNTER — Other Ambulatory Visit: Payer: Self-pay | Admitting: Physician Assistant

## 2018-09-02 DIAGNOSIS — J449 Chronic obstructive pulmonary disease, unspecified: Secondary | ICD-10-CM

## 2018-11-02 DIAGNOSIS — G4733 Obstructive sleep apnea (adult) (pediatric): Secondary | ICD-10-CM | POA: Diagnosis not present

## 2019-01-09 ENCOUNTER — Ambulatory Visit: Payer: BLUE CROSS/BLUE SHIELD | Admitting: Adult Health

## 2019-02-28 ENCOUNTER — Ambulatory Visit: Payer: BC Managed Care – PPO | Admitting: Adult Health

## 2019-02-28 ENCOUNTER — Encounter: Payer: Self-pay | Admitting: Adult Health

## 2019-02-28 ENCOUNTER — Other Ambulatory Visit: Payer: Self-pay

## 2019-02-28 VITALS — BP 147/98 | HR 84 | Temp 97.2°F | Ht 72.0 in | Wt 278.8 lb

## 2019-02-28 DIAGNOSIS — G4733 Obstructive sleep apnea (adult) (pediatric): Secondary | ICD-10-CM | POA: Diagnosis not present

## 2019-02-28 DIAGNOSIS — Z9989 Dependence on other enabling machines and devices: Secondary | ICD-10-CM

## 2019-02-28 NOTE — Patient Instructions (Signed)

## 2019-02-28 NOTE — Progress Notes (Signed)
PATIENT: ZYRON DEELEY DOB: Dec 17, 1965  REASON FOR VISIT: follow up HISTORY FROM: patient  HISTORY OF PRESENT ILLNESS: Today 02/28/19:  Mr.Deats is a 53 year old male with a history of obstructive sleep apnea on CPAP.  His download indicates that he uses machine 26 out of 30 days for compliance of 87%.  He uses machine greater than 4 hours 15 days for compliance of 50%.  On average he uses his machine 4 hours and 50 minutes.  His residual AHI is 1.2 on 7 cm of water with EPR 3.  His leak in the 95th percentile is 44.2 L/min.  He reports that some nights he does not sleep more than 4 hours.  He does have a full beard and reports that this sometimes creates a leak with his mask.  Overall he feels that he is doing well.  He returns today for an evaluation.  HISTORY 12/27/17: Mr. Meals is a 53 year old male with a history of obstructive sleep apnea on CPAP.  He returns today for follow-up.  His download indicates that he uses machine 28 out of 30 days for compliance of 93%.  He uses machine greater than 4 hours 20 days for compliance of 87%.  On average he uses his machine 5 hours and 53 minutes.  His residual AHI is 1.9 on 7 cm of water with EPR of 3.  His leak in the 95th percentile is 35.2 L/min.  He states his leak is from his facial hair.  He does report that he needs to trim his beard and typically that helps the leak.  Overall he feels that he is doing well.  He returns today for evaluation.   REVIEW OF SYSTEMS: Out of a complete 14 system review of symptoms, the patient complains only of the following symptoms, and all other reviewed systems are negative.  Epworth sleepiness score 6, fatigue severity score 27  ALLERGIES: No Known Allergies  HOME MEDICATIONS: Outpatient Medications Prior to Visit  Medication Sig Dispense Refill  . acyclovir ointment (ZOVIRAX) 5 % USE EVERY 4 TO 6 HOURS AS NEEDED FOR COLD SORES 15 g 2  . albuterol (PROVENTIL) (2.5 MG/3ML) 0.083% nebulizer  solution Take 3 mLs (2.5 mg total) by nebulization every 6 (six) hours as needed for wheezing. Please place on filer for patient. 75 mL 12  . ALPRAZolam (XANAX) 2 MG tablet Take 1 tablet (2 mg total) by mouth at bedtime as needed for anxiety. Do not use this without using you CPAP. 30 tablet 5  . amLODipine (NORVASC) 5 MG tablet Take 5 mg by mouth daily.    . budesonide-formoterol (SYMBICORT) 160-4.5 MCG/ACT inhaler Inhale 2 puffs into the lungs 2 (two) times daily. 1 Inhaler 12  . chlorhexidine gluconate, MEDLINE KIT, (PERIDEX) 0.12 % solution Use as directed 15 mLs in the mouth or throat 2 (two) times daily. 120 mL 3  . fluticasone (FLONASE) 50 MCG/ACT nasal spray Place 2 sprays into both nostrils daily as needed for allergies or rhinitis.    Marland Kitchen ipratropium (ATROVENT) 0.02 % nebulizer solution Take 2.5 mLs (0.5 mg total) by nebulization 4 (four) times daily. 75 mL 18  . montelukast (SINGULAIR) 10 MG tablet Take 1 tablet (10 mg total) by mouth at bedtime. 30 tablet 11  . NON FORMULARY Stool softeners take daily due to hemorrhoids    . pantoprazole (PROTONIX) 40 MG tablet TAKE 1 TABLET BY MOUTH ONCE DAILY IN THE MORNING ON AN EMPTY STOMACH AND  EAT  SOMETHING  30  MINUTES  AFTER  TAKING 90 tablet 0  . Peak Flow Meter (AIRZONE PEAK FLOW METER) DEVI Please use daily to keep track of your breathing. 1 each 0  . promethazine (PHENERGAN) 12.5 MG tablet Take 12.5 mg by mouth as needed.    . sildenafil (REVATIO) 20 MG tablet Take 20 mg by mouth as needed. Take 1-4 tablets    . SUMAtriptan (IMITREX) 100 MG tablet TAKE ONE-HALF TO ONE TABLET BY MOUTH AS NEEDED FOR MIGRAINE. MAX 200 MG IN 24 HOURS. 9 tablet 3  . SUMAtriptan (IMITREX) 100 MG tablet TAKE 1/2 TO 1 (ONE-HALF TO ONE) TABLET BY MOUTH AS NEEDED FOR  MIGRAINE -- **MAXIMUM OF 200 MG IN 24 HOURS** 9 tablet 5  . topiramate (TOPAMAX) 50 MG tablet Take 1 tablet (50 mg total) by mouth at bedtime. 30 tablet 11  . doxycycline (VIBRA-TABS) 100 MG tablet Take 1  tablet (100 mg total) by mouth 2 (two) times daily. 20 tablet 0  . predniSONE (DELTASONE) 20 MG tablet Take 3 PO QAM x3days, 2 PO QAM x3days, 1 PO QAM x3days 18 tablet 0   Facility-Administered Medications Prior to Visit  Medication Dose Route Frequency Provider Last Rate Last Dose  . methylPREDNISolone sodium succinate (SOLU-MEDROL) 125 mg/2 mL injection 125 mg  125 mg Intravenous Once [name]        PAST MEDICAL HISTORY: Past Medical History:  Diagnosis Date  . Allergy   . Asthma   . Bronchitis   . GERD (gastroesophageal reflux disease)   . Insomnia   . Migraines   . Sleep apnea     PAST SURGICAL HISTORY: Past Surgical History:  Procedure Laterality Date  . KNEE SURGERY Right    2017    FAMILY HISTORY: Family History  Problem Relation Age of Onset  . Asthma Mother   . Colon cancer Neg Hx   . Colon polyps Neg Hx   . Esophageal cancer Neg Hx   . Rectal cancer Neg Hx   . Stomach cancer Neg Hx     SOCIAL HISTORY: Social History   Socioeconomic History  . Marital status: Significant Other    Spouse name: Not on file  . Number of children: Not on file  . Years of education: Not on file  . Highest education level: Not on file  Occupational History  . Not on file  Social Needs  . Financial resource strain: Not on file  . Food insecurity    Worry: Not on file    Inability: Not on file  . Transportation needs    Medical: Not on file    Non-medical: Not on file  Tobacco Use  . Smoking status: Former Smoker    Packs/day: 1.00    Years: 25.00    Pack years: 25.00    Types: Cigarettes    Quit date: 03/29/2006    Years since quitting: 12.9  . Smokeless tobacco: Never Used  Substance and Sexual Activity  . Alcohol use: Yes    Alcohol/week: 0.0 standard drinks    Comment: rarely- nothing for a few years  . Drug use: No  . Sexual activity: Yes    Birth control/protection: None  Lifestyle  . Physical activity    Days per week: Not on file     Minutes per session: Not on file  . Stress: Not on file  Relationships  . Social Herbalist on phone: Not on file    Gets  together: Not on file    Attends religious service: Not on file    Active member of club or organization: Not on file    Attends meetings of clubs or organizations: Not on file    Relationship status: Not on file  . Intimate partner violence    Fear of current or ex partner: Not on file    Emotionally abused: Not on file    Physically abused: Not on file    Forced sexual activity: Not on file  Other Topics Concern  . Not on file  Social History Narrative  . Not on file      PHYSICAL EXAM  Vitals:   02/28/19 0755  BP: (!) 147/98  Pulse: 84  Temp: (!) 97.2 F (36.2 C)  TempSrc: Oral  Weight: 278 lb 12.8 oz (126.5 kg)  Height: 6' (1.829 m)   Body mass index is 37.81 kg/m.  Generalized: Well developed, in no acute distress  Chest: Lungs clear to auscultation bilaterally  Neurological examination  Mentation: Alert oriented to time, place, history taking. Follows all commands speech and language fluent Cranial nerve II-XII: Extraocular movements were full, visual field were full on confrontational test Head turning and shoulder shrug  were normal and symmetric. Motor: The motor testing reveals 5 over 5 strength of all 4 extremities. Good symmetric motor tone is noted throughout.  Sensory: Sensory testing is intact to soft touch on all 4 extremities. No evidence of extinction is noted.  Gait and station: Gait is normal.     DIAGNOSTIC DATA (LABS, IMAGING, TESTING) - I reviewed patient records, labs, notes, testing and imaging myself where available.  Lab Results  Component Value Date   WBC 7.1 09/09/2016   HGB 15.3 09/09/2016   HCT 44.1 09/09/2016   MCV 87.6 09/09/2016   PLT 204 07/02/2015      Component Value Date/Time   NA 143 09/09/2016 1816   K 4.2 09/09/2016 1816   CL 105 09/09/2016 1816   CO2 25 09/09/2016 1816   GLUCOSE  100 (H) 09/09/2016 1816   GLUCOSE 237 (H) 07/02/2015 0450   BUN 17 09/09/2016 1816   CREATININE 1.09 09/09/2016 1816   CALCIUM 9.6 09/09/2016 1816   PROT 7.3 09/09/2016 1816   ALBUMIN 4.9 09/09/2016 1816   AST 39 09/09/2016 1816   ALT 50 (H) 09/09/2016 1816   ALKPHOS 77 09/09/2016 1816   BILITOT 0.8 09/09/2016 1816   GFRNONAA 78 09/09/2016 1816   GFRAA 90 09/09/2016 1816   Lab Results  Component Value Date   CHOL 173 12/24/2016   HDL 34 (L) 12/24/2016   LDLCALC 85 12/24/2016   TRIG 271 (H) 12/24/2016   CHOLHDL 5.1 (H) 12/24/2016   Lab Results  Component Value Date   HGBA1C 5.7 (H) 12/17/2016   No results found for: Yaak PLAN 53 y.o. year old male  has a past medical history of Allergy, Asthma, Bronchitis, GERD (gastroesophageal reflux disease), Insomnia, Migraines, and Sleep apnea. here with :  1. Obstructive sleep apnea on CPAP  The patient's CPAP download shows excellent compliance and good treatment of his apnea.  He is encouraged to continue using CPAP nightly and greater than 4 hours each night.  He is advised that if his symptoms worsen or he develops new symptoms he should let us know.  He will follow-up in 1 year or sooner if needed    I spent 15 minutes with the patient. 50% of this time  was spent reviewing CPAP download   Ward Givens, MSN, NP-C 02/28/2019, 8:27 AM [facility] [address] [place], [place] [address] [phone]

## 2019-03-27 DIAGNOSIS — B350 Tinea barbae and tinea capitis: Secondary | ICD-10-CM | POA: Diagnosis not present

## 2019-03-27 DIAGNOSIS — Z131 Encounter for screening for diabetes mellitus: Secondary | ICD-10-CM | POA: Diagnosis not present

## 2019-03-27 DIAGNOSIS — J449 Chronic obstructive pulmonary disease, unspecified: Secondary | ICD-10-CM | POA: Diagnosis not present

## 2019-03-27 DIAGNOSIS — Z13220 Encounter for screening for lipoid disorders: Secondary | ICD-10-CM | POA: Diagnosis not present

## 2019-03-27 DIAGNOSIS — R21 Rash and other nonspecific skin eruption: Secondary | ICD-10-CM | POA: Diagnosis not present

## 2019-03-27 DIAGNOSIS — Z23 Encounter for immunization: Secondary | ICD-10-CM | POA: Diagnosis not present

## 2019-03-27 DIAGNOSIS — J410 Simple chronic bronchitis: Secondary | ICD-10-CM | POA: Diagnosis not present

## 2019-06-14 DIAGNOSIS — G4733 Obstructive sleep apnea (adult) (pediatric): Secondary | ICD-10-CM | POA: Diagnosis not present

## 2019-08-10 DIAGNOSIS — K219 Gastro-esophageal reflux disease without esophagitis: Secondary | ICD-10-CM | POA: Diagnosis not present

## 2019-08-10 DIAGNOSIS — B350 Tinea barbae and tinea capitis: Secondary | ICD-10-CM | POA: Diagnosis not present

## 2019-08-10 DIAGNOSIS — G4700 Insomnia, unspecified: Secondary | ICD-10-CM | POA: Diagnosis not present

## 2019-09-13 DIAGNOSIS — D649 Anemia, unspecified: Secondary | ICD-10-CM | POA: Diagnosis not present

## 2019-09-13 DIAGNOSIS — L308 Other specified dermatitis: Secondary | ICD-10-CM | POA: Diagnosis not present

## 2019-09-13 DIAGNOSIS — R21 Rash and other nonspecific skin eruption: Secondary | ICD-10-CM | POA: Diagnosis not present

## 2019-09-14 DIAGNOSIS — R21 Rash and other nonspecific skin eruption: Secondary | ICD-10-CM | POA: Diagnosis not present

## 2019-09-14 DIAGNOSIS — D649 Anemia, unspecified: Secondary | ICD-10-CM | POA: Diagnosis not present

## 2019-09-18 DIAGNOSIS — G4733 Obstructive sleep apnea (adult) (pediatric): Secondary | ICD-10-CM | POA: Diagnosis not present

## 2019-09-26 DIAGNOSIS — D649 Anemia, unspecified: Secondary | ICD-10-CM | POA: Diagnosis not present

## 2019-09-26 DIAGNOSIS — M25562 Pain in left knee: Secondary | ICD-10-CM | POA: Diagnosis not present

## 2019-09-26 DIAGNOSIS — R21 Rash and other nonspecific skin eruption: Secondary | ICD-10-CM | POA: Diagnosis not present

## 2019-09-26 DIAGNOSIS — N529 Male erectile dysfunction, unspecified: Secondary | ICD-10-CM | POA: Diagnosis not present

## 2019-12-18 DIAGNOSIS — G4733 Obstructive sleep apnea (adult) (pediatric): Secondary | ICD-10-CM | POA: Diagnosis not present

## 2020-02-11 DIAGNOSIS — M25511 Pain in right shoulder: Secondary | ICD-10-CM | POA: Diagnosis not present

## 2020-02-11 DIAGNOSIS — G8929 Other chronic pain: Secondary | ICD-10-CM | POA: Diagnosis not present

## 2020-02-15 DIAGNOSIS — Z23 Encounter for immunization: Secondary | ICD-10-CM | POA: Diagnosis not present

## 2020-02-15 DIAGNOSIS — M25511 Pain in right shoulder: Secondary | ICD-10-CM | POA: Diagnosis not present

## 2020-02-15 DIAGNOSIS — N529 Male erectile dysfunction, unspecified: Secondary | ICD-10-CM | POA: Diagnosis not present

## 2020-02-15 DIAGNOSIS — G8929 Other chronic pain: Secondary | ICD-10-CM | POA: Diagnosis not present

## 2020-02-28 ENCOUNTER — Ambulatory Visit: Payer: BC Managed Care – PPO | Admitting: Adult Health

## 2021-03-24 ENCOUNTER — Encounter (HOSPITAL_COMMUNITY): Payer: Self-pay

## 2021-03-24 ENCOUNTER — Emergency Department (HOSPITAL_COMMUNITY): Payer: BC Managed Care – PPO

## 2021-03-24 ENCOUNTER — Emergency Department (HOSPITAL_COMMUNITY)
Admission: EM | Admit: 2021-03-24 | Discharge: 2021-03-24 | Disposition: A | Discharge status: Home / Self Care | Payer: BC Managed Care – PPO | Attending: Emergency Medicine | Admitting: Emergency Medicine

## 2021-03-24 DIAGNOSIS — W000XXA Fall on same level due to ice and snow, initial encounter: Secondary | ICD-10-CM | POA: Insufficient documentation

## 2021-03-24 DIAGNOSIS — J449 Chronic obstructive pulmonary disease, unspecified: Secondary | ICD-10-CM | POA: Insufficient documentation

## 2021-03-24 DIAGNOSIS — Z87891 Personal history of nicotine dependence: Secondary | ICD-10-CM | POA: Diagnosis not present

## 2021-03-24 DIAGNOSIS — S4992XA Unspecified injury of left shoulder and upper arm, initial encounter: Secondary | ICD-10-CM | POA: Diagnosis present

## 2021-03-24 DIAGNOSIS — J45909 Unspecified asthma, uncomplicated: Secondary | ICD-10-CM | POA: Diagnosis not present

## 2021-03-24 DIAGNOSIS — S43015A Anterior dislocation of left humerus, initial encounter: Secondary | ICD-10-CM | POA: Diagnosis not present

## 2021-03-24 DIAGNOSIS — Z79899 Other long term (current) drug therapy: Secondary | ICD-10-CM | POA: Insufficient documentation

## 2021-03-24 DIAGNOSIS — Z7951 Long term (current) use of inhaled steroids: Secondary | ICD-10-CM | POA: Insufficient documentation

## 2021-03-24 MED ORDER — HYDROMORPHONE HCL 1 MG/ML IJ SOLN
1.0000 mg | Freq: Once | INTRAMUSCULAR | Status: AC
  Administered 2021-03-24: 15:00:00 1 mg via INTRAVENOUS
  Filled 2021-03-24: qty 1

## 2021-03-24 MED ORDER — FENTANYL CITRATE PF 50 MCG/ML IJ SOSY
50.0000 ug | PREFILLED_SYRINGE | INTRAMUSCULAR | Status: DC | PRN
  Administered 2021-03-24: 14:00:00 50 ug via INTRAVENOUS
  Filled 2021-03-24: qty 1

## 2021-03-24 MED ORDER — LIDOCAINE-EPINEPHRINE (PF) 2 %-1:200000 IJ SOLN
20.0000 mL | Freq: Once | INTRAMUSCULAR | Status: AC
  Administered 2021-03-24: 20 mL
  Filled 2021-03-24: qty 20

## 2021-03-24 MED ORDER — PROPOFOL 10 MG/ML IV BOLUS
0.5000 mg/kg | Freq: Once | INTRAVENOUS | Status: AC
  Administered 2021-03-24: 17:00:00 62.6 mg via INTRAVENOUS
  Filled 2021-03-24: qty 20

## 2021-03-24 MED ORDER — SODIUM CHLORIDE 0.9 % IV BOLUS
1000.0000 mL | Freq: Once | INTRAVENOUS | Status: AC
  Administered 2021-03-24: 15:00:00 1000 mL via INTRAVENOUS

## 2021-03-24 MED ORDER — HYDROCODONE-ACETAMINOPHEN 5-325 MG PO TABS: 1.0000 | ORAL_TABLET | ORAL | 0 refills | Status: DC | PRN

## 2021-03-24 NOTE — ED Notes (Signed)
Pt at baseline.  Ambulatory to bathroom w/ a steady gait.

## 2021-03-24 NOTE — Discharge Instructions (Addendum)
Continue to wear sling until you follow-up with orthopedic surgery.  There is a number below to call for orthopedic surgery follow-up if you are unable to see your personal orthopedic surgeon.  A prescription for narcotic pain medication was sent to your pharmacy in [place].  Take this only as needed.  Avoid taking this medication with Xanax as the combination can cause respiratory depression.

## 2021-03-24 NOTE — ED Provider Notes (Signed)
[facility] DEPT Provider Note   CSN: [id number] Arrival date & time: 03/24/21  1308     History Chief Complaint  Patient presents with   Shoulder Injury    Derek Pacheco is a 55 y.o. male.  HPI 55 year old male presents with left shoulder injury.  On 12/24 he slipped on black ice and landed on his shoulder.  Is been hurting since.  Very painful to move even his elbow.  He went to [facility] as he has seen them before for his knee and today they told him he dislocated his shoulder via x-ray.  They sent him here for reduction.  He has a little bit of tingling in his left hand. Pain is rated as 4. Last ate at around 7 AM.  Past Medical History:  Diagnosis Date   Allergy    Asthma    Bronchitis    GERD (gastroesophageal reflux disease)    Insomnia    Migraines    Sleep apnea     Patient Active Problem List   Diagnosis Date Noted   Elevated LFTs 09/13/2016   Stopped smoking with greater than 20 pack year history 10/23/2015   COPD (chronic obstructive pulmonary disease) ([place]) 07/02/2015   Insomnia 05/10/2012    Past Surgical History:  Procedure Laterality Date   KNEE SURGERY Right    2017       Family History  Problem Relation Age of Onset   Asthma Mother    Colon cancer Neg Hx    Colon polyps Neg Hx    Esophageal cancer Neg Hx    Rectal cancer Neg Hx    Stomach cancer Neg Hx     Social History   Tobacco Use   Smoking status: Former    Packs/day: 1.00    Years: 25.00    Pack years: 25.00    Types: Cigarettes    Quit date: 03/29/2006    Years since quitting: 14.9   Smokeless tobacco: Never  Substance Use Topics   Alcohol use: Yes    Alcohol/week: 0.0 standard drinks    Comment: rarely- nothing for a few years   Drug use: No    Home Medications Prior to Admission medications   Medication Sig Start Date End Date Taking? Authorizing Provider  acyclovir ointment (ZOVIRAX) 5 % USE EVERY 4 TO 6 HOURS AS NEEDED  FOR COLD SORES 03/23/16   Tereasa Coop, PA-C  albuterol (PROVENTIL) (2.5 MG/3ML) 0.083% nebulizer solution Take 3 mLs (2.5 mg total) by nebulization every 6 (six) hours as needed for wheezing. Please place on filer for patient. 07/08/17   Tereasa Coop, PA-C  ALPRAZolam Duanne Moron) 2 MG tablet Take 1 tablet (2 mg total) by mouth at bedtime as needed for anxiety. Do not use this without using you CPAP. 10/26/17   Tereasa Coop, PA-C  amLODipine (NORVASC) 5 MG tablet Take 5 mg by mouth daily. 12/21/18   [provider]  budesonide-formoterol (SYMBICORT) 160-4.5 MCG/ACT inhaler Inhale 2 puffs into the lungs 2 (two) times daily. 07/08/17   Tereasa Coop, PA-C  chlorhexidine gluconate, MEDLINE KIT, (PERIDEX) 0.12 % solution Use as directed 15 mLs in the mouth or throat 2 (two) times daily. 10/26/17   Tereasa Coop, PA-C  fluticasone (FLONASE) 50 MCG/ACT nasal spray Place 2 sprays into both nostrils daily as needed for allergies or rhinitis.    [provider]  ipratropium (ATROVENT) 0.02 % nebulizer solution Take 2.5 mLs (0.5 mg total)  by nebulization 4 (four) times daily. 07/08/17   Tereasa Coop, PA-C  montelukast (SINGULAIR) 10 MG tablet Take 1 tablet (10 mg total) by mouth at bedtime. 11/04/17   Martyn Ehrich, NP  NON FORMULARY Stool softeners take daily due to hemorrhoids    [provider]  pantoprazole (PROTONIX) 40 MG tablet TAKE 1 TABLET BY MOUTH ONCE DAILY IN THE MORNING ON AN EMPTY STOMACH AND  EAT  SOMETHING  30  MINUTES  AFTER  TAKING 05/04/18   Jacelyn Pi, Lilia Argue, MD  Peak Flow Meter ([place]) DEVI Please use daily to keep track of your breathing. 10/10/15   Tereasa Coop, PA-C  promethazine (PHENERGAN) 12.5 MG tablet Take 12.5 mg by mouth as needed. 09/02/17   [provider]  sildenafil (REVATIO) 20 MG tablet Take 20 mg by mouth as needed. Take 1-4 tablets 09/04/18   [provider]  SUMAtriptan (IMITREX) 100 MG tablet  TAKE ONE-HALF TO ONE TABLET BY MOUTH AS NEEDED FOR MIGRAINE. MAX 200 MG IN 24 HOURS. 03/03/17   Tereasa Coop, PA-C  SUMAtriptan (IMITREX) 100 MG tablet TAKE 1/2 TO 1 (ONE-HALF TO ONE) TABLET BY MOUTH AS NEEDED FOR  MIGRAINE -- **MAXIMUM OF 200 MG IN 24 HOURS** 10/24/17   Tereasa Coop, PA-C  topiramate (TOPAMAX) 50 MG tablet Take 1 tablet (50 mg total) by mouth at bedtime. 03/21/15   Robyn Haber, MD    Allergies    Patient has no known allergies.  Review of Systems   Review of Systems  Musculoskeletal:  Positive for arthralgias.  Neurological:  Positive for numbness (tingling). Negative for weakness.   Physical Exam Updated Vital Signs BP (!) 148/117    Pulse 94    Temp 98.3 F (36.8 C) (Oral)    Resp 19    SpO2 94%   Physical Exam Vitals and nursing note reviewed.  Constitutional:      Appearance: He is well-developed.  HENT:     Head: Normocephalic and atraumatic.     Right Ear: External ear normal.     Left Ear: External ear normal.     Nose: Nose normal.  Eyes:     General:        Right eye: No discharge.        Left eye: No discharge.  Cardiovascular:     Rate and Rhythm: Normal rate and regular rhythm.     Pulses:          Radial pulses are 2+ on the left side.  Pulmonary:     Effort: Pulmonary effort is normal.  Abdominal:     General: There is no distension.  Musculoskeletal:     Left shoulder: Deformity and tenderness present. Decreased range of motion.     Left upper arm: No tenderness.     Comments: Normal radial, ulnar, median nerve testing in left hand Normal grip strength left hand Grossly normal sensation in left hand and left shoulder  Skin:    General: Skin is warm and dry.  Neurological:     Mental Status: He is alert.  Psychiatric:        Mood and Affect: Mood is not anxious.    ED Results / Procedures / Treatments   Labs (all labs ordered are listed, but only abnormal results are displayed) Labs Reviewed - No data to  display  EKG None  Radiology DG Shoulder Left  Result Date: 03/24/2021 CLINICAL DATA:  Shoulder pain.  EXAM: LEFT SHOULDER - 2+ VIEW COMPARISON:  None. FINDINGS: Anterior dislocation of the LEFT humeral head relative to the glenoid fossa. No fracture line or displaced fracture fragment is seen. Overlying acromioclavicular joint space appears normally aligned. IMPRESSION: Anterior dislocation of the LEFT humeral head. Electronically Signed   By: Franki Cabot M.D.   On: 03/24/2021 14:26    Procedures .Sedation  Date/Time: 03/24/2021 4:47 PM Performed by: Sherwood Gambler, MD Authorized by: Sherwood Gambler, MD   Consent:    Consent obtained:  Verbal and written   Consent given by:  Patient   Risks discussed:  Allergic reaction, dysrhythmia, inadequate sedation, nausea, vomiting, respiratory compromise necessitating ventilatory assistance and intubation, prolonged sedation necessitating reversal and prolonged hypoxia resulting in organ damage Universal protocol:    Immediately prior to procedure, a time out was called: yes   Indications:    Procedure performed:  Dislocation reduction   Procedure necessitating sedation performed by:  Physician performing sedation Pre-sedation assessment:    Time since last food or drink:  9+ hoursr   ASA classification: class 2 - patient with mild systemic disease     Mallampati score:  I - soft palate, uvula, fauces, pillars visible   Pre-sedation assessments completed and reviewed: airway patency, cardiovascular function, hydration status, mental status, nausea/vomiting, pain level, respiratory function and temperature   Immediate pre-procedure details:    Reassessment: Patient reassessed immediately prior to procedure     Reviewed: vital signs, relevant labs/tests and NPO status     Verified: bag valve mask available, emergency equipment available, IV patency confirmed, oxygen available and suction available   Procedure details (see MAR for exact  dosages):    Preoxygenation:  Nasal cannula   Sedation:  Propofol   Intended level of sedation: deep   Intra-procedure monitoring:  Blood pressure monitoring, cardiac monitor, continuous pulse oximetry, continuous capnometry, frequent LOC assessments and frequent vital sign checks   Intra-procedure events: none     Total Provider sedation time (minutes):  12 Post-procedure details:    Attendance: Constant attendance by certified staff until patient recovered     Recovery: Patient returned to pre-procedure baseline     Post-sedation assessments completed and reviewed: airway patency, cardiovascular function, hydration status, mental status, nausea/vomiting, pain level, respiratory function and temperature     Patient is stable for discharge or admission: yes     Procedure completion:  Tolerated well, no immediate complications Reduction of dislocation  Date/Time: 03/24/2021 4:48 PM Performed by: Sherwood Gambler, MD Authorized by: Sherwood Gambler, MD  Consent: Verbal consent obtained. Written consent obtained. Risks and benefits: risks, benefits and alternatives were discussed Consent given by: patient Required items: required blood products, implants, devices, and special equipment available Patient identity confirmed: verbally with patient Time out: Immediately prior to procedure a "time out" was called to verify the correct patient, procedure, equipment, support staff and site/side marked as required. Preparation: Patient was prepped and draped in the usual sterile fashion. Local anesthesia used: yes Anesthesia: local infiltration (joint injection, 10 cc 2% lido with epi)  Anesthesia: Local anesthesia used: yes Local Anesthetic: lidocaine 2% with epinephrine Anesthetic total: 10 mL  Sedation: Patient sedated: yes Sedatives: propofol  Patient tolerance: patient tolerated the procedure well with no immediate complications Comments: Initially tried to reduce after joint injection  and pain medicine but it was very difficult.  After sedation with propofol, minimal manipulation needed to reduce left shoulder.  Intact radial pulse after procedure.     Medications Ordered in  ED Medications  fentaNYL (SUBLIMAZE) injection 50 mcg (50 mcg Intravenous Given 03/24/21 1416)  sodium chloride 0.9 % bolus 1,000 mL (has no administration in time range)  HYDROmorphone (DILAUDID) injection 1 mg (has no administration in time range)  lidocaine-EPINEPHrine (XYLOCAINE W/EPI) 2 %-1:200000 (PF) injection 20 mL (has no administration in time range)    ED Course  I have reviewed the triage vital signs and the nursing notes.  Pertinent labs & imaging results that were available during my care of the patient were reviewed by me and considered in my medical decision making (see chart for details).    MDM Rules/Calculators/A&P                         Difficult to reduce with just IV pain medicine and joint injection.  Thus after consent, propofol sedation performed.  Did seem to get reduction of shoulder.  He is also complaining of a little bit of elbow pain so we will add on left elbow x-ray.  Care transferred to Dr. Doren Custard.  No obvious neurovascular compromise.    Final Clinical Impression(s) / ED Diagnoses Final diagnoses:  Anterior dislocation of left shoulder, initial encounter    Rx / DC Orders ED Discharge Orders     None        Sherwood Gambler, MD 03/24/21 1651

## 2021-03-24 NOTE — ED Provider Notes (Signed)
Emergency Medicine Provider Triage Evaluation Note  Derek Pacheco , a 55 y.o. male  was evaluated in triage.  Pt complains of fell 12/24 onto his left shoulder states no head injury or other areas of pain. States he went to an orthopedist office and was told it was dislocated and came to ER. Hx of R shoulder pains. No hx of dislocation.   Review of Systems  Positive: Left shoulder pain Negative: Fever   Physical Exam  BP (!) 148/117    Pulse 94    Temp 98.3 F (36.8 C) (Oral)    Resp 19    SpO2 94%  Gen:   Awake, in pain Resp:  Normal effort  MSK:   Moves extremities without difficulty  Other:  L radial pulse intact. Sensation intact.   Medical Decision Making  Medically screening exam initiated at 2:12 PM.  Appropriate orders placed.  KELSO BIBBY was informed that the remainder of the evaluation will be completed by another provider, this initial triage assessment does not replace that evaluation, and the importance of remaining in the ED until their evaluation is complete.  Will obtain xray and give analgesia    Tedd Sias, [place] 03/24/21 1414    Sherwood Gambler, MD 03/24/21 [phone]

## 2021-03-24 NOTE — ED Notes (Signed)
Conscious Sedation For Reduction of Left Shoulder  1630: Consent signed, end tidal CO2 monitor in place, VS cycling Q3, O2 set up, Ambu bag at bedside, cardiac monitor on pt.  1631: Time out preformed, Dr. Regenia Skeeter, Dr. Doren Custard, Sharrie Rothman, RN, Wille Glaser, OT and [place], RT all present at bedside.  1633: 60 mg propofol given per Dr. Regenia Skeeter.  Pt remains awake and talking. Respirations even and unlabored. 1635: 30 mg propofol given per Dr. Regenia Skeeter.  Sedation beginning to take effect, pt speaking gibberish, respirations even and unlabored.  Began attempting reduction. 1637: 30 mg propofol given per Dr. Regenia Skeeter.  Pt sedated however w/ negative vocalization.  Respirations remain unlabored. Lead for respirations pulled loose during reduction.  Cessation of negative vocalization. 1638: Reduction completed.  1641: X-ray to bedside for post reduction film.  1643: Pt awake and alert.  Reports shoulder feeling better.

## 2021-03-24 NOTE — ED Notes (Signed)
Pt ambulatory to bathroom

## 2021-03-24 NOTE — ED Triage Notes (Signed)
Pt fell several days ago and has a dislocated left shoulder confirmed from orthopedic doctor.

## 2021-06-04 ENCOUNTER — Encounter: Payer: Self-pay | Admitting: Gastroenterology

## 2021-06-25 ENCOUNTER — Ambulatory Visit: Payer: BC Managed Care – PPO | Admitting: Gastroenterology

## 2021-10-28 ENCOUNTER — Inpatient Hospital Stay (HOSPITAL_COMMUNITY)
Admission: EM | Admit: 2021-10-28 | Discharge: 2021-10-30 | DRG: 193 | Disposition: A | Discharge status: Home / Self Care | Payer: BC Managed Care – PPO | Attending: Internal Medicine | Admitting: Internal Medicine

## 2021-10-28 ENCOUNTER — Encounter (HOSPITAL_COMMUNITY): Payer: Self-pay | Admitting: Emergency Medicine

## 2021-10-28 ENCOUNTER — Emergency Department (HOSPITAL_COMMUNITY): Payer: BC Managed Care – PPO

## 2021-10-28 ENCOUNTER — Other Ambulatory Visit: Payer: Self-pay

## 2021-10-28 DIAGNOSIS — J189 Pneumonia, unspecified organism: Principal | ICD-10-CM

## 2021-10-28 DIAGNOSIS — Z825 Family history of asthma and other chronic lower respiratory diseases: Secondary | ICD-10-CM

## 2021-10-28 DIAGNOSIS — J9601 Acute respiratory failure with hypoxia: Secondary | ICD-10-CM | POA: Diagnosis present

## 2021-10-28 DIAGNOSIS — Z79899 Other long term (current) drug therapy: Secondary | ICD-10-CM | POA: Diagnosis not present

## 2021-10-28 DIAGNOSIS — J441 Chronic obstructive pulmonary disease with (acute) exacerbation: Secondary | ICD-10-CM

## 2021-10-28 DIAGNOSIS — Z9989 Dependence on other enabling machines and devices: Secondary | ICD-10-CM | POA: Diagnosis not present

## 2021-10-28 DIAGNOSIS — Z87891 Personal history of nicotine dependence: Secondary | ICD-10-CM

## 2021-10-28 DIAGNOSIS — Z6831 Body mass index (BMI) 31.0-31.9, adult: Secondary | ICD-10-CM | POA: Diagnosis not present

## 2021-10-28 DIAGNOSIS — K219 Gastro-esophageal reflux disease without esophagitis: Secondary | ICD-10-CM

## 2021-10-28 DIAGNOSIS — G4733 Obstructive sleep apnea (adult) (pediatric): Secondary | ICD-10-CM

## 2021-10-28 DIAGNOSIS — R718 Other abnormality of red blood cells: Secondary | ICD-10-CM

## 2021-10-28 DIAGNOSIS — F419 Anxiety disorder, unspecified: Secondary | ICD-10-CM | POA: Diagnosis present

## 2021-10-28 DIAGNOSIS — I10 Essential (primary) hypertension: Secondary | ICD-10-CM | POA: Diagnosis present

## 2021-10-28 DIAGNOSIS — R739 Hyperglycemia, unspecified: Secondary | ICD-10-CM | POA: Diagnosis present

## 2021-10-28 DIAGNOSIS — E669 Obesity, unspecified: Secondary | ICD-10-CM | POA: Diagnosis present

## 2021-10-28 DIAGNOSIS — J470 Bronchiectasis with acute lower respiratory infection: Secondary | ICD-10-CM | POA: Diagnosis present

## 2021-10-28 DIAGNOSIS — Z7951 Long term (current) use of inhaled steroids: Secondary | ICD-10-CM | POA: Diagnosis not present

## 2021-10-28 DIAGNOSIS — Z20822 Contact with and (suspected) exposure to covid-19: Secondary | ICD-10-CM | POA: Diagnosis present

## 2021-10-28 DIAGNOSIS — E66811 Obesity, class 1: Secondary | ICD-10-CM | POA: Diagnosis present

## 2021-10-28 LAB — BLOOD GAS, ARTERIAL
Acid-Base Excess: 0.5 mmol/L (ref 0.0–2.0)
Bicarbonate: 25.4 mmol/L (ref 20.0–28.0)
Drawn by: 22223
FIO2: 50 %
O2 Saturation: 99.6 %
Patient temperature: 37
pCO2 arterial: 41 mmHg (ref 32–48)
pH, Arterial: 7.4 (ref 7.35–7.45)
pO2, Arterial: 187 mmHg — ABNORMAL HIGH (ref 83–108)

## 2021-10-28 LAB — BASIC METABOLIC PANEL
Anion gap: 9 (ref 5–15)
BUN: 14 mg/dL (ref 6–20)
CO2: 23 mmol/L (ref 22–32)
Calcium: 8.8 mg/dL — ABNORMAL LOW (ref 8.9–10.3)
Chloride: 105 mmol/L (ref 98–111)
Creatinine, Ser: 0.9 mg/dL (ref 0.61–1.24)
GFR, Estimated: >60 mL/min (ref >60)
Glucose, Bld: 160 mg/dL — ABNORMAL HIGH (ref 70–99)
Potassium: 3.9 mmol/L (ref 3.5–5.1)
Sodium: 137 mmol/L (ref 135–145)

## 2021-10-28 LAB — CBC
HCT: 44.2 % (ref 39.0–52.0)
Hemoglobin: 14.1 g/dL (ref 13.0–17.0)
MCH: 24.9 pg — ABNORMAL LOW (ref 26.0–34.0)
MCHC: 31.9 g/dL (ref 30.0–36.0)
MCV: 78 fL — ABNORMAL LOW (ref 80.0–100.0)
Platelets: 287 10*3/uL (ref 150–400)
RBC: 5.67 MIL/uL (ref 4.22–5.81)
RDW: 16.7 % — ABNORMAL HIGH (ref 11.5–15.5)
WBC: 8.3 10*3/uL (ref 4.0–10.5)
nRBC: 0 % (ref 0.0–0.2)

## 2021-10-28 LAB — IRON AND TIBC
Iron: 60 ug/dL (ref 45–182)
Saturation Ratios: 11 % — ABNORMAL LOW (ref 17.9–39.5)
TIBC: 556 ug/dL — ABNORMAL HIGH (ref 250–450)
UIBC: 496 ug/dL

## 2021-10-28 LAB — MAGNESIUM: Magnesium: 2.1 mg/dL (ref 1.7–2.4)

## 2021-10-28 LAB — TROPONIN I (HIGH SENSITIVITY)
Troponin I (High Sensitivity): 6 ng/L
Troponin I (High Sensitivity): 6 ng/L (ref <18)

## 2021-10-28 LAB — FERRITIN: Ferritin: 7 ng/mL — ABNORMAL LOW (ref 24–336)

## 2021-10-28 LAB — PHOSPHORUS: Phosphorus: 4.1 mg/dL (ref 2.5–4.6)

## 2021-10-28 LAB — PROCALCITONIN: Procalcitonin: <0.1 ng/mL

## 2021-10-28 LAB — HIV ANTIBODY (ROUTINE TESTING W REFLEX): HIV Screen 4th Generation wRfx: NONREACTIVE

## 2021-10-28 LAB — BRAIN NATRIURETIC PEPTIDE: B Natriuretic Peptide: 20 pg/mL (ref 0.0–100.0)

## 2021-10-28 LAB — MRSA NEXT GEN BY PCR, NASAL: MRSA by PCR Next Gen: NOT DETECTED

## 2021-10-28 LAB — STREP PNEUMONIAE URINARY ANTIGEN: Strep Pneumo Urinary Antigen: NEGATIVE

## 2021-10-28 LAB — SARS CORONAVIRUS 2 BY RT PCR: SARS Coronavirus 2 by RT PCR: NEGATIVE

## 2021-10-28 MED ORDER — MONTELUKAST SODIUM 10 MG PO TABS
10.0000 mg | ORAL_TABLET | Freq: Every day | ORAL | Status: DC
  Administered 2021-10-28 – 2021-10-29 (×2): 10 mg via ORAL
  Filled 2021-10-28 (×2): qty 1

## 2021-10-28 MED ORDER — IPRATROPIUM BROMIDE 0.02 % IN SOLN
0.5000 mg | Freq: Four times a day (QID) | RESPIRATORY_TRACT | Status: DC
  Administered 2021-10-28: 0.5 mg via RESPIRATORY_TRACT
  Filled 2021-10-28: qty 2.5

## 2021-10-28 MED ORDER — GUAIFENESIN-DM 100-10 MG/5ML PO SYRP: 5.0000 mL | ORAL_SOLUTION | ORAL | Status: DC | PRN

## 2021-10-28 MED ORDER — SODIUM CHLORIDE 0.9 % IV SOLN
1.0000 g | Freq: Once | INTRAVENOUS | Status: AC
  Administered 2021-10-28: 1 g via INTRAVENOUS
  Filled 2021-10-28: qty 10

## 2021-10-28 MED ORDER — ARFORMOTEROL TARTRATE 15 MCG/2ML IN NEBU
15.0000 ug | INHALATION_SOLUTION | Freq: Two times a day (BID) | RESPIRATORY_TRACT | Status: DC
  Administered 2021-10-28 – 2021-10-30 (×5): 15 ug via RESPIRATORY_TRACT
  Filled 2021-10-28 (×5): qty 2

## 2021-10-28 MED ORDER — PANTOPRAZOLE SODIUM 40 MG PO TBEC
40.0000 mg | DELAYED_RELEASE_TABLET | Freq: Two times a day (BID) | ORAL | Status: DC
  Administered 2021-10-28 – 2021-10-30 (×4): 40 mg via ORAL
  Filled 2021-10-28 (×5): qty 1

## 2021-10-28 MED ORDER — DM-GUAIFENESIN ER 30-600 MG PO TB12
1.0000 | ORAL_TABLET | Freq: Two times a day (BID) | ORAL | Status: DC
Start: 2021-10-28 — End: 2021-10-30
  Administered 2021-10-28 – 2021-10-30 (×5): 1 via ORAL
  Filled 2021-10-28 (×5): qty 1

## 2021-10-28 MED ORDER — ACETAMINOPHEN 325 MG PO TABS: 650.0000 mg | ORAL_TABLET | Freq: Four times a day (QID) | ORAL | Status: DC | PRN

## 2021-10-28 MED ORDER — HYDRALAZINE HCL 20 MG/ML IJ SOLN
10.0000 mg | Freq: Four times a day (QID) | INTRAMUSCULAR | Status: DC | PRN
  Administered 2021-10-28 (×2): 10 mg via INTRAVENOUS
  Filled 2021-10-28 (×2): qty 1

## 2021-10-28 MED ORDER — METHYLPREDNISOLONE SODIUM SUCC 40 MG IJ SOLR
40.0000 mg | Freq: Two times a day (BID) | INTRAMUSCULAR | Status: DC
  Administered 2021-10-28 – 2021-10-30 (×4): 40 mg via INTRAVENOUS
  Filled 2021-10-28 (×4): qty 1

## 2021-10-28 MED ORDER — SODIUM CHLORIDE 0.9 % IV SOLN
500.0000 mg | INTRAVENOUS | Status: DC
  Administered 2021-10-28 – 2021-10-30 (×3): 500 mg via INTRAVENOUS
  Filled 2021-10-28 (×3): qty 5

## 2021-10-28 MED ORDER — ONDANSETRON HCL 4 MG/2ML IJ SOLN
4.0000 mg | Freq: Four times a day (QID) | INTRAMUSCULAR | Status: DC | PRN
  Administered 2021-10-28: 4 mg via INTRAVENOUS
  Filled 2021-10-28: qty 2

## 2021-10-28 MED ORDER — IPRATROPIUM-ALBUTEROL 0.5-2.5 (3) MG/3ML IN SOLN
3.0000 mL | Freq: Four times a day (QID) | RESPIRATORY_TRACT | Status: DC
Start: 2021-10-28 — End: 2021-10-29
  Administered 2021-10-28 – 2021-10-29 (×6): 3 mL via RESPIRATORY_TRACT
  Filled 2021-10-28 (×6): qty 3

## 2021-10-28 MED ORDER — ACETAMINOPHEN 650 MG RE SUPP: 650.0000 mg | Freq: Four times a day (QID) | RECTAL | Status: DC | PRN

## 2021-10-28 MED ORDER — PROMETHAZINE HCL 12.5 MG PO TABS: 12.5000 mg | ORAL_TABLET | Freq: Four times a day (QID) | ORAL | Status: DC | PRN

## 2021-10-28 MED ORDER — MUPIROCIN 2 % EX OINT: 1.0000 | TOPICAL_OINTMENT | Freq: Two times a day (BID) | CUTANEOUS | Status: DC

## 2021-10-28 MED ORDER — CEFDINIR 300 MG PO CAPS
300.0000 mg | ORAL_CAPSULE | Freq: Two times a day (BID) | ORAL | Status: DC
  Administered 2021-10-28 – 2021-10-30 (×5): 300 mg via ORAL
  Filled 2021-10-28 (×5): qty 1

## 2021-10-28 MED ORDER — CHLORHEXIDINE GLUCONATE CLOTH 2 % EX PADS
6.0000 | MEDICATED_PAD | Freq: Every day | CUTANEOUS | Status: DC
  Administered 2021-10-28 – 2021-10-29 (×2): 6 via TOPICAL

## 2021-10-28 MED ORDER — MOMETASONE FURO-FORMOTEROL FUM 200-5 MCG/ACT IN AERO
2.0000 | INHALATION_SPRAY | Freq: Two times a day (BID) | RESPIRATORY_TRACT | Status: DC
  Administered 2021-10-28: 2 via RESPIRATORY_TRACT
  Filled 2021-10-28: qty 8.8

## 2021-10-28 MED ORDER — IOHEXOL 350 MG/ML SOLN
100.0000 mL | Freq: Once | INTRAVENOUS | Status: AC | PRN
  Administered 2021-10-28: 80 mL via INTRAVENOUS

## 2021-10-28 MED ORDER — SUMATRIPTAN SUCCINATE 100 MG PO TABS
50.0000 mg | ORAL_TABLET | ORAL | Status: DC | PRN
  Administered 2021-10-28: 100 mg via ORAL
  Filled 2021-10-28 (×3): qty 1

## 2021-10-28 MED ORDER — ONDANSETRON HCL 4 MG/2ML IJ SOLN
4.0000 mg | Freq: Once | INTRAMUSCULAR | Status: AC
  Administered 2021-10-28: 4 mg via INTRAVENOUS
  Filled 2021-10-28: qty 2

## 2021-10-28 MED ORDER — PANTOPRAZOLE SODIUM 40 MG IV SOLR
40.0000 mg | INTRAVENOUS | Status: DC
  Administered 2021-10-28: 40 mg via INTRAVENOUS
  Filled 2021-10-28: qty 10

## 2021-10-28 MED ORDER — DM-GUAIFENESIN ER 30-600 MG PO TB12
1.0000 | ORAL_TABLET | Freq: Two times a day (BID) | ORAL | Status: DC
  Administered 2021-10-28: 1 via ORAL
  Filled 2021-10-28: qty 1

## 2021-10-28 MED ORDER — ALBUTEROL SULFATE (2.5 MG/3ML) 0.083% IN NEBU
10.0000 mg/h | INHALATION_SOLUTION | Freq: Once | RESPIRATORY_TRACT | Status: AC
  Administered 2021-10-28: 10 mg/h via RESPIRATORY_TRACT
  Filled 2021-10-28: qty 12

## 2021-10-28 MED ORDER — ORAL CARE MOUTH RINSE: 15.0000 mL | OROMUCOSAL | Status: DC | PRN

## 2021-10-28 MED ORDER — MAGNESIUM SULFATE IN D5W 1-5 GM/100ML-% IV SOLN
1.0000 g | Freq: Once | INTRAVENOUS | Status: AC
Start: 2021-10-28 — End: 2021-10-28
  Administered 2021-10-28: 1 g via INTRAVENOUS
  Filled 2021-10-28: qty 100

## 2021-10-28 MED ORDER — ENOXAPARIN SODIUM 60 MG/0.6ML IJ SOSY
60.0000 mg | PREFILLED_SYRINGE | INTRAMUSCULAR | Status: DC
  Administered 2021-10-28 – 2021-10-30 (×3): 60 mg via SUBCUTANEOUS
  Filled 2021-10-28 (×3): qty 0.6

## 2021-10-28 MED ORDER — ALPRAZOLAM 1 MG PO TABS
1.0000 mg | ORAL_TABLET | Freq: Two times a day (BID) | ORAL | Status: DC | PRN
  Administered 2021-10-28 – 2021-10-29 (×2): 1 mg via ORAL
  Filled 2021-10-28 (×2): qty 2
  Filled 2021-10-28: qty 1

## 2021-10-28 MED ORDER — METHYLPREDNISOLONE SODIUM SUCC 125 MG IJ SOLR
125.0000 mg | Freq: Once | INTRAMUSCULAR | Status: AC
  Administered 2021-10-28: 125 mg via INTRAVENOUS
  Filled 2021-10-28: qty 2

## 2021-10-28 MED ORDER — BUDESONIDE 0.5 MG/2ML IN SUSP
0.5000 mg | Freq: Two times a day (BID) | RESPIRATORY_TRACT | Status: DC
  Administered 2021-10-28 – 2021-10-30 (×5): 0.5 mg via RESPIRATORY_TRACT
  Filled 2021-10-28 (×5): qty 2

## 2021-10-28 MED ORDER — MAGNESIUM SULFATE 50 % IJ SOLN
1.0000 g | Freq: Once | INTRAMUSCULAR | Status: DC
  Filled 2021-10-28: qty 2

## 2021-10-28 MED ORDER — AMLODIPINE BESYLATE 5 MG PO TABS
5.0000 mg | ORAL_TABLET | Freq: Every day | ORAL | Status: DC
  Administered 2021-10-28 – 2021-10-30 (×3): 5 mg via ORAL
  Filled 2021-10-28 (×3): qty 1

## 2021-10-28 MED ORDER — ONDANSETRON HCL 4 MG PO TABS: 4.0000 mg | ORAL_TABLET | Freq: Four times a day (QID) | ORAL | Status: DC | PRN

## 2021-10-28 NOTE — TOC Progression Note (Signed)
  Transition of Care [facility]) Screening Note   Patient Details  Name: Derek Pacheco Date of Birth: 03/22/1966   [facility] CM/SW Contact:    Boneta Lucks, RN Phone Number: 10/28/2021, 10:04 AM    Transition of Care Department [facility]) has reviewed patient and no TOC needs have been identified at this time. We will continue to monitor patient advancement through interdisciplinary progression rounds. If new patient transition needs arise, please place a TOC consult.      Barriers to Discharge: Continued Medical Work up  Expected Discharge Plan and Services       Living arrangements for the past 2 months: [place]

## 2021-10-28 NOTE — ED Notes (Signed)
Patient took BiPAP off at this time to eat breakfast. Patient states his breathing still fells tight. Patient placed on 4L/[place] at this time while patient eats.

## 2021-10-28 NOTE — ED Triage Notes (Signed)
Pt c/o sob for a couple of days and chest pain that started last night.

## 2021-10-28 NOTE — H&P (Addendum)
History and Physical    Patient: Derek Pacheco:[id number] DOB: February 18, 1966 DOA: 10/28/2021 DOS: the patient was seen and examined on 10/28/2021 PCP: Tereasa Coop, PA-C  Patient coming from: Home  Chief Complaint:  Chief Complaint  Patient presents with   Shortness of Breath   HPI: Derek Pacheco is a 56 y.o. male with medical history significant of hypertension, OSA on CPAP, hypertension, GERD, migraine, COPD who presents to the emergency department via EMS due to about 1 week of mild shortness of breath which progressively worsened yesterday in the evening around 6 PM, this was associated with some chest pain which has since self resolved.  He attempted home breathing treatments without any alleviation, so EMS was activated and on arrival of EMS team, he was noted to be hypoxic and was placed on 6 LPM of oxygen via [place] with O2 sat at 88% on arrival to the ED per ED physician.  He endorsed occasional nonproductive cough mostly at night, but denies fever, chills, headache, nausea, vomiting, abdominal pain.   ED Course:  In the emergency department, he was tachypneic, BP was 172/112, O2 sat was 100% on BiPAP, temperature 98.49F, pulse 98 bpm.  Work-up in the ED showed normal CBC except low MCV, BMP was normal except hyperglycemia, troponin x2 was flat at 6, BNP 20.0.  SARS coronavirus 2 was negative. Chest x-ray showed no active disease CT angiography chest with contrast showed: 1. No evidence of pulmonary embolism. 2. Bronchial wall thickening bilaterally. 3. Focal ground-glass opacity in the right upper lobe measuring 2.9 cm. While findings may be infectious or inflammatory, Adenocarcinoma cannot be excluded. Patient was placed on BiPAP with improved oxygenation, breathing treatment was provided, he was empirically treated for consequent pneumonia, Solu-Medrol 125 mg x 1 was given, Zofran was given.  Hospitalist was asked to admit patient for further evaluation and  management.   Review of Systems: Review of systems as noted in the HPI. All other systems reviewed and are negative.   Past Medical History:  Diagnosis Date   Allergy    Asthma    Bronchitis    GERD (gastroesophageal reflux disease)    Insomnia    Migraines    Sleep apnea    Past Surgical History:  Procedure Laterality Date   KNEE SURGERY Right    2017    Social History:  reports that he quit smoking about 15 years ago. His smoking use included cigarettes. He has a 25.00 pack-year smoking history. He has never used smokeless tobacco. He reports current alcohol use. He reports that he does not use drugs.   No Known Allergies  Family History  Problem Relation Age of Onset   Asthma Mother    Colon cancer Neg Hx    Colon polyps Neg Hx    Esophageal cancer Neg Hx    Rectal cancer Neg Hx    Stomach cancer Neg Hx      Prior to Admission medications   Medication Sig Start Date End Date Taking? Authorizing Provider  acyclovir ointment (ZOVIRAX) 5 % USE EVERY 4 TO 6 HOURS AS NEEDED FOR COLD SORES 03/23/16   Tereasa Coop, PA-C  albuterol (PROVENTIL) (2.5 MG/3ML) 0.083% nebulizer solution Take 3 mLs (2.5 mg total) by nebulization every 6 (six) hours as needed for wheezing. Please place on filer for patient. 07/08/17   Tereasa Coop, PA-C  ALPRAZolam Duanne Moron) 2 MG tablet Take 1 tablet (2 mg total) by mouth at bedtime as needed for  anxiety. Do not use this without using you CPAP. 10/26/17   Tereasa Coop, PA-C  amLODipine (NORVASC) 5 MG tablet Take 5 mg by mouth daily. 12/21/18   [provider]  budesonide-formoterol (SYMBICORT) 160-4.5 MCG/ACT inhaler Inhale 2 puffs into the lungs 2 (two) times daily. 07/08/17   Tereasa Coop, PA-C  chlorhexidine gluconate, MEDLINE KIT, (PERIDEX) 0.12 % solution Use as directed 15 mLs in the mouth or throat 2 (two) times daily. 10/26/17   Tereasa Coop, PA-C  fluticasone (FLONASE) 50 MCG/ACT nasal spray Place 2 sprays into both  nostrils daily as needed for allergies or rhinitis.    [provider]  HYDROcodone-acetaminophen (NORCO) 5-325 MG tablet Take 1 tablet by mouth every 4 (four) hours as needed for severe pain. 03/24/21   Sherwood Gambler, MD  ipratropium (ATROVENT) 0.02 % nebulizer solution Take 2.5 mLs (0.5 mg total) by nebulization 4 (four) times daily. 07/08/17   Tereasa Coop, PA-C  montelukast (SINGULAIR) 10 MG tablet Take 1 tablet (10 mg total) by mouth at bedtime. 11/04/17   Martyn Ehrich, NP  NON FORMULARY Stool softeners take daily due to hemorrhoids    [provider]  pantoprazole (PROTONIX) 40 MG tablet TAKE 1 TABLET BY MOUTH ONCE DAILY IN THE MORNING ON AN EMPTY STOMACH AND  EAT  SOMETHING  30  MINUTES  AFTER  TAKING 05/04/18   Jacelyn Pi, Lilia Argue, MD  Peak Flow Meter ([place]) DEVI Please use daily to keep track of your breathing. 10/10/15   Tereasa Coop, PA-C  promethazine (PHENERGAN) 12.5 MG tablet Take 12.5 mg by mouth as needed. 09/02/17   [provider]  sildenafil (REVATIO) 20 MG tablet Take 20 mg by mouth as needed. Take 1-4 tablets 09/04/18   [provider]  SUMAtriptan (IMITREX) 100 MG tablet TAKE ONE-HALF TO ONE TABLET BY MOUTH AS NEEDED FOR MIGRAINE. MAX 200 MG IN 24 HOURS. 03/03/17   Tereasa Coop, PA-C  SUMAtriptan (IMITREX) 100 MG tablet TAKE 1/2 TO 1 (ONE-HALF TO ONE) TABLET BY MOUTH AS NEEDED FOR  MIGRAINE -- **MAXIMUM OF 200 MG IN 24 HOURS** 10/24/17   Tereasa Coop, PA-C  topiramate (TOPAMAX) 50 MG tablet Take 1 tablet (50 mg total) by mouth at bedtime. 03/21/15   Robyn Haber, MD    Physical Exam: BP (!) 161/122   Pulse 95   Temp 98.3 F (36.8 C) (Oral)   Resp (!) 30   Ht 6' (1.829 m)   Wt 125 kg   SpO2 100%   BMI 37.37 kg/m   General: 56 y.o. year-old male ill appearing, but in no acute distress.  Alert and oriented x3. HEENT: NCAT, EOMI Neck: Supple, trachea medial Cardiovascular: Regular rate and rhythm  with no rubs or gallops.  No thyromegaly or JVD noted.  No lower extremity edema. 2/4 pulses in all 4 extremities. Respiratory: Tachypnea.  Diffuse expiratory wheezing on auscultation.   Abdomen: Soft, nontender nondistended with normal bowel sounds x4 quadrants. Muskuloskeletal: No cyanosis, clubbing or edema noted bilaterally Neuro: CN II-XII intact, strength 5/5 x 4, sensation, reflexes intact Skin: No ulcerative lesions noted or rashes Psychiatry: Judgement and insight appear normal. Mood is appropriate for condition and setting          Labs on Admission:  Basic Metabolic Panel: Recent Labs  Lab 10/28/21 0229 10/28/21 0509  NA 137  --   K 3.9  --   CL 105  --  CO2 23  --   GLUCOSE 160*  --   BUN 14  --   CREATININE 0.90  --   CALCIUM 8.8*  --   MG  --  2.1  PHOS  --  4.1   Liver Function Tests: No results for input(s): "AST", "ALT", "ALKPHOS", "BILITOT", "PROT", "ALBUMIN" in the last 168 hours. No results for input(s): "LIPASE", "AMYLASE" in the last 168 hours. No results for input(s): "AMMONIA" in the last 168 hours. CBC: Recent Labs  Lab 10/28/21 0229  WBC 8.3  HGB 14.1  HCT 44.2  MCV 78.0*  PLT 287   Cardiac Enzymes: No results for input(s): "CKTOTAL", "CKMB", "CKMBINDEX", "TROPONINI" in the last 168 hours.  BNP (last 3 results) Recent Labs    10/28/21 0229  BNP 20.0    ProBNP (last 3 results) No results for input(s): "PROBNP" in the last 8760 hours.  CBG: No results for input(s): "GLUCAP" in the last 168 hours.  Radiological Exams on Admission: CT Angio Chest Pulmonary Embolism (PE) W or WO Contrast  Result Date: 10/28/2021 CLINICAL DATA:  Pulmonary embolism suspected, high probability. Chest pain and shortness of breath. EXAM: CT ANGIOGRAPHY CHEST WITH CONTRAST TECHNIQUE: Multidetector CT imaging of the chest was performed using the standard protocol during bolus administration of intravenous contrast. Multiplanar CT image reconstructions and MIPs  were obtained to evaluate the vascular anatomy. RADIATION DOSE REDUCTION: This exam was performed according to the departmental dose-optimization program which includes automated exposure control, adjustment of the mA and/or kV according to patient size and/or use of iterative reconstruction technique. CONTRAST:  15mL OMNIPAQUE IOHEXOL 350 MG/ML SOLN COMPARISON:  08/14/2009. FINDINGS: Cardiovascular: The heart is normal in size and there is no pericardial effusion. 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 The pulmonary trunk is normal in caliber. No pulmonary artery filling defect is identified. Evaluation of the pulmonary arteries at the lung bases is limited due to respiratory motion artifact. Mediastinum/Nodes: No mediastinal or axillary lymphadenopathy. A prominent nonspecific lymph node is present at the left hilum measuring 1.1 cm in short axis diameter. The thyroid gland, trachea, and esophagus are within normal limits. Lungs/Pleura: Mild bronchial wall thickening is present bilaterally. A focal ground-glass opacity is noted in the right upper lobe measuring 2.9 cm, axial image 89. No consolidation, effusion, or pneumothorax. Upper Abdomen: No acute abnormality. Musculoskeletal: Degenerative changes in the thoracic spine. No acute osseous abnormality. Review of the MIP images confirms the above findings. IMPRESSION: 1. No evidence of pulmonary embolism. 2. Bronchial wall thickening bilaterally. 3. Focal ground-glass opacity in the right upper lobe measuring 2.9 cm. While findings may be infectious or inflammatory, Adenocarcinoma cannot be excluded. Follow up by CT is recommended in 12 months, with continued annual surveillance for a minimum of 3 years. These recommendations are taken from: Recommendations for the Management of Subsolid Pulmonary Nodules Detected at CT: A Statement from the [facility] 2013; 266:1, 304-317. 4. Aneurysmal  dilatation of the ascending aorta measuring 4.3 cm. Recommend annual imaging followup by CTA or MRA. This recommendation follows 2010 ACCF/AHA/AATS/ACR/ASA/SCA/SCAI/SIR/STS/SVM Guidelines for the Diagnosis and Management of Patients with Thoracic Aortic Disease. Circulation. 2010; 121: N165-B903. Aortic aneurysm NOS (ICD10-I71.9) Electronically Signed   By: Brett Fairy M.D.   On: 10/28/2021 04:36   DG Chest Portable 1 View  Result Date: 10/28/2021 CLINICAL DATA:  Respiratory distress for 2 days EXAM: PORTABLE CHEST 1 VIEW COMPARISON:  04/09/2016 FINDINGS: The heart size and mediastinal  contours are within normal limits. Both lungs are clear. The visualized skeletal structures are unremarkable. IMPRESSION: No active disease. Electronically Signed   By: Inez Catalina M.D.   On: 10/28/2021 02:54    EKG: I independently viewed the EKG done and my findings are as followed: Sinus tachycardia at a rate of 101 bpm with increased PR interval  Assessment/Plan Present on Admission:  Acute respiratory failure with hypoxia ([place])  COPD exacerbation ([place])  Principal Problem:   Acute respiratory failure with hypoxia ([place]) Active Problems:   COPD exacerbation ([place])   CAP (community acquired pneumonia)   Low mean corpuscular volume (MCV)   Essential hypertension   GERD (gastroesophageal reflux disease)   OSA on CPAP   Hyperglycemia  Acute respiratory failure with hypoxia possibly due to presumed CAP POA with superimposed COPD exacerbation Patient was started on ceftriaxone and azithromycin, we shall continue same at this time with plan to de-escalate/discontinue based on blood culture, sputum culture, urine Legionella, strep pneumo and procalcitonin Continue Tylenol as needed Continue Atrovent, Mucinex, Robitussin, Solu-Medrol, Dulera, Singulair. Continue Protonix to prevent steroid-induced ulcer Continue incentive spirometry and flutter valve Continue BiPAP with plan to transition to supplemental oxygen  to maintain O2 sat > 94% and with eventual plan to wean patient off oxygen as tolerated (patient does not use oxygen at baseline)  Low MCV MCV 78, H/H is normal at 14.1/44.2 Iron studies will be done  Hyperglycemia possibly reactive CBG 160.  Continue to monitor blood glucose level and consider checking hemoglobin A1c if blood glucose continues to stay elevated, but this could be due to steroid effect  OSA on CPAP Patient is currently on BiPAP  Essential hypertension Continue amlodipine Continue hydralazine 10 mg every 6 hours as needed for SBP > 170  GERD Continue Protonix   DVT prophylaxis: Lovenox   Advance Care Planning:   Code Status: Full Code   Consults: None  Family Communication: Wife at bedside (all questions answered to satisfaction)  Severity of Illness: The appropriate patient status for this patient is OBSERVATION. Observation status is judged to be reasonable and necessary in order to provide the required intensity of service to ensure the patient's safety. The patient's presenting symptoms, physical exam findings, and initial radiographic and laboratory data in the context of their medical condition is felt to place them at decreased risk for further clinical deterioration. Furthermore, it is anticipated that the patient will be medically stable for discharge from the hospital within 2 midnights of admission.   Author: Bernadette Hoit, DO 10/28/2021 6:13 AM  For on call review [email or web address].

## 2021-10-28 NOTE — ED Provider Notes (Signed)
[facility] EMERGENCY DEPARTMENT Provider Note   CSN: [id number] Arrival date & time: 10/28/21  0216     History  Chief Complaint  Patient presents with   Shortness of Breath    Derek Pacheco is a 56 y.o. male.  Patient presents to the emergency department for evaluation of difficulty breathing.  Patient reports that he has been having trouble breathing all day.  He does have a history of asthma.  He did have some chest pain earlier, this has resolved.  Patient reports that breathing difficulty progressively worsened over the evening and tonight.       Home Medications Prior to Admission medications   Medication Sig Start Date End Date Taking? Authorizing Provider  acyclovir ointment (ZOVIRAX) 5 % USE EVERY 4 TO 6 HOURS AS NEEDED FOR COLD SORES 03/23/16   Tereasa Coop, PA-C  albuterol (PROVENTIL) (2.5 MG/3ML) 0.083% nebulizer solution Take 3 mLs (2.5 mg total) by nebulization every 6 (six) hours as needed for wheezing. Please place on filer for patient. 07/08/17   Tereasa Coop, PA-C  ALPRAZolam Duanne Moron) 2 MG tablet Take 1 tablet (2 mg total) by mouth at bedtime as needed for anxiety. Do not use this without using you CPAP. 10/26/17   Tereasa Coop, PA-C  amLODipine (NORVASC) 5 MG tablet Take 5 mg by mouth daily. 12/21/18   [provider]  budesonide-formoterol (SYMBICORT) 160-4.5 MCG/ACT inhaler Inhale 2 puffs into the lungs 2 (two) times daily. 07/08/17   Tereasa Coop, PA-C  chlorhexidine gluconate, MEDLINE KIT, (PERIDEX) 0.12 % solution Use as directed 15 mLs in the mouth or throat 2 (two) times daily. 10/26/17   Tereasa Coop, PA-C  fluticasone (FLONASE) 50 MCG/ACT nasal spray Place 2 sprays into both nostrils daily as needed for allergies or rhinitis.    [provider]  HYDROcodone-acetaminophen (NORCO) 5-325 MG tablet Take 1 tablet by mouth every 4 (four) hours as needed for severe pain. 03/24/21   Sherwood Gambler, MD  ipratropium (ATROVENT)  0.02 % nebulizer solution Take 2.5 mLs (0.5 mg total) by nebulization 4 (four) times daily. 07/08/17   Tereasa Coop, PA-C  montelukast (SINGULAIR) 10 MG tablet Take 1 tablet (10 mg total) by mouth at bedtime. 11/04/17   Martyn Ehrich, NP  NON FORMULARY Stool softeners take daily due to hemorrhoids    [provider]  pantoprazole (PROTONIX) 40 MG tablet TAKE 1 TABLET BY MOUTH ONCE DAILY IN THE MORNING ON AN EMPTY STOMACH AND  EAT  SOMETHING  30  MINUTES  AFTER  TAKING 05/04/18   Jacelyn Pi, Lilia Argue, MD  Peak Flow Meter (Gann) DEVI Please use daily to keep track of your breathing. 10/10/15   Tereasa Coop, PA-C  promethazine (PHENERGAN) 12.5 MG tablet Take 12.5 mg by mouth as needed. 09/02/17   [provider]  sildenafil (REVATIO) 20 MG tablet Take 20 mg by mouth as needed. Take 1-4 tablets 09/04/18   [provider]  SUMAtriptan (IMITREX) 100 MG tablet TAKE ONE-HALF TO ONE TABLET BY MOUTH AS NEEDED FOR MIGRAINE. MAX 200 MG IN 24 HOURS. 03/03/17   Tereasa Coop, PA-C  SUMAtriptan (IMITREX) 100 MG tablet TAKE 1/2 TO 1 (ONE-HALF TO ONE) TABLET BY MOUTH AS NEEDED FOR  MIGRAINE -- **MAXIMUM OF 200 MG IN 24 HOURS** 10/24/17   Tereasa Coop, PA-C  topiramate (TOPAMAX) 50 MG tablet Take 1 tablet (50 mg total) by mouth at bedtime. 03/21/15  Robyn Haber, MD      Allergies    Patient has no known allergies.    Review of Systems   Review of Systems  Physical Exam Updated Vital Signs BP (!) 152/116   Pulse 96   Temp 98.3 F (36.8 C) (Oral)   Resp (!) 26   Ht 6' (1.829 m)   Wt 125 kg   SpO2 100%   BMI 37.37 kg/m  Physical Exam Vitals and nursing note reviewed.  Constitutional:      General: He is not in acute distress.    Appearance: He is well-developed. He is diaphoretic.  HENT:     Head: Normocephalic and atraumatic.     Mouth/Throat:     Mouth: Mucous membranes are moist.  Eyes:     General: Vision grossly intact. Gaze aligned  appropriately.     Extraocular Movements: Extraocular movements intact.     Conjunctiva/sclera: Conjunctivae normal.  Cardiovascular:     Rate and Rhythm: Normal rate and regular rhythm.     Pulses: Normal pulses.     Heart sounds: Normal heart sounds, S1 normal and S2 normal. No murmur heard.    No friction rub. No gallop.  Pulmonary:     Effort: Tachypnea and accessory muscle usage present. No respiratory distress.     Breath sounds: Normal breath sounds.  Abdominal:     Palpations: Abdomen is soft.     Tenderness: There is no abdominal tenderness. There is no guarding or rebound.     Hernia: No hernia is present.  Musculoskeletal:        General: No swelling.     Cervical back: Full passive range of motion without pain, normal range of motion and neck supple. No pain with movement, spinous process tenderness or muscular tenderness. Normal range of motion.     Right lower leg: No edema.     Left lower leg: No edema.  Skin:    General: Skin is warm.     Capillary Refill: Capillary refill takes less than 2 seconds.     Findings: No ecchymosis, erythema, lesion or wound.  Neurological:     Mental Status: He is alert and oriented to person, place, and time.     GCS: GCS eye subscore is 4. GCS verbal subscore is 5. GCS motor subscore is 6.     Cranial Nerves: Cranial nerves 2-12 are intact.     Sensory: Sensation is intact.     Motor: Motor function is intact. No weakness or abnormal muscle tone.     Coordination: Coordination is intact.  Psychiatric:        Mood and Affect: Mood normal.        Speech: Speech normal.        Behavior: Behavior normal.     ED Results / Procedures / Treatments   Labs (all labs ordered are listed, but only abnormal results are displayed) Labs Reviewed  CBC - Abnormal; Notable for the following components:      Result Value   MCV 78.0 (*)    MCH 24.9 (*)    RDW 16.7 (*)    All other components within normal limits  BASIC METABOLIC PANEL -  Abnormal; Notable for the following components:   Glucose, Bld 160 (*)    Calcium 8.8 (*)    All other components within normal limits  BLOOD GAS, ARTERIAL - Abnormal; Notable for the following components:   pO2, Arterial 187 (*)    All other  components within normal limits  SARS CORONAVIRUS 2 BY RT PCR  BRAIN NATRIURETIC PEPTIDE  TROPONIN I (HIGH SENSITIVITY)  TROPONIN I (HIGH SENSITIVITY)    EKG EKG Interpretation  Date/Time:  Wednesday October 28 2021 02:27:48 EDT Ventricular Rate:  101 PR Interval:  205 QRS Duration: 97 QT Interval:  364 QTC Calculation: 472 R Axis:   50 Text Interpretation: Sinus tachycardia Prolonged PR interval Confirmed by Orpah Greek [phone]) on 10/28/2021 2:35:06 AM  Radiology CT Angio Chest Pulmonary Embolism (PE) W or WO Contrast  Result Date: 10/28/2021 CLINICAL DATA:  Pulmonary embolism suspected, high probability. Chest pain and shortness of breath. EXAM: CT ANGIOGRAPHY CHEST WITH CONTRAST TECHNIQUE: Multidetector CT imaging of the chest was performed using the standard protocol during bolus administration of intravenous contrast. Multiplanar CT image reconstructions and MIPs were obtained to evaluate the vascular anatomy. RADIATION DOSE REDUCTION: This exam was performed according to the departmental dose-optimization program which includes automated exposure control, adjustment of the mA and/or kV according to patient size and/or use of iterative reconstruction technique. CONTRAST:  50mL OMNIPAQUE IOHEXOL 350 MG/ML SOLN COMPARISON:  08/14/2009. FINDINGS: Cardiovascular: The heart is normal in size and there is no pericardial effusion. 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 The pulmonary trunk is normal in caliber. No pulmonary artery filling defect is identified. Evaluation of the pulmonary arteries at the lung bases is limited due to respiratory motion artifact. Mediastinum/Nodes: No mediastinal or  axillary lymphadenopathy. A prominent nonspecific lymph node is present at the left hilum measuring 1.1 cm in short axis diameter. The thyroid gland, trachea, and esophagus are within normal limits. Lungs/Pleura: Mild bronchial wall thickening is present bilaterally. A focal ground-glass opacity is noted in the right upper lobe measuring 2.9 cm, axial image 89. No consolidation, effusion, or pneumothorax. Upper Abdomen: No acute abnormality. Musculoskeletal: Degenerative changes in the thoracic spine. No acute osseous abnormality. Review of the MIP images confirms the above findings. IMPRESSION: 1. No evidence of pulmonary embolism. 2. Bronchial wall thickening bilaterally. 3. Focal ground-glass opacity in the right upper lobe measuring 2.9 cm. While findings may be infectious or inflammatory, Adenocarcinoma cannot be excluded. Follow up by CT is recommended in 12 months, with continued annual surveillance for a minimum of 3 years. These recommendations are taken from: Recommendations for the Management of Subsolid Pulmonary Nodules Detected at CT: A Statement from the [facility] 2013; 266:1, 304-317. 4. 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 Recommend annual imaging followup by CTA or MRA. This recommendation follows 2010 ACCF/AHA/AATS/ACR/ASA/SCA/SCAI/SIR/STS/SVM Guidelines for the Diagnosis and Management of Patients with Thoracic Aortic Disease. Circulation. 2010; 121: I347-Q259. Aortic aneurysm NOS (ICD10-I71.9) Electronically Signed   By: Brett Fairy M.D.   On: 10/28/2021 04:36   DG Chest Portable 1 View  Result Date: 10/28/2021 CLINICAL DATA:  Respiratory distress for 2 days EXAM: PORTABLE CHEST 1 VIEW COMPARISON:  04/09/2016 FINDINGS: The heart size and mediastinal contours are within normal limits. Both lungs are clear. The visualized skeletal structures are unremarkable. IMPRESSION: No active disease. Electronically Signed   By: Inez Catalina M.D.   On:  10/28/2021 02:54    Procedures Procedures    Medications Ordered in ED Medications  methylPREDNISolone sodium succinate (SOLU-MEDROL) 125 mg/2 mL injection 125 mg (has no administration in time range)  cefTRIAXone (ROCEPHIN) 1 g in sodium chloride 0.9 % 100 mL IVPB (has no administration in time range)  azithromycin (ZITHROMAX) 500 mg in  sodium chloride 0.9 % 250 mL IVPB (has no administration in time range)  albuterol (PROVENTIL,VENTOLIN) solution continuous neb (has no administration in time range)  ondansetron (ZOFRAN) injection 4 mg (4 mg Intravenous Given 10/28/21 0422)  iohexol (OMNIPAQUE) 350 MG/ML injection 100 mL (80 mLs Intravenous Contrast Given 10/28/21 0421)    ED Course/ Medical Decision Making/ A&P                           Medical Decision Making Amount and/or Complexity of Data Reviewed Labs: ordered. Radiology: ordered.  Risk Prescription drug management.   Patient presents to emergency department for evaluation of shortness of breath.  Differential diagnosis includes asthma exacerbation, pneumonia, PE, acute coronary syndrome, CHF.  Patient indicates that he has been having progressively worsening symptoms over the course of the day.  Patient in distress on arrival, diaphoretic, and with significantly increased work of breathing.   Lung auscultation, however, reveals minimal wheezing and he does not have any rales.  Etiology of his shortness of breath was unclear. He was placed on BiPAP for hypoxia and increased work of breathing.    Patient underwent extensive work-up.  Blood work does not suggest acute coronary syndrome or congestive heart failure.  Chest x-ray did not show evidence of pneumonia.  Patient underwent CT angiography to further evaluate.  There is no evidence of PE, but perhaps right upper lobe pneumonia.  Patient treated with Solu-Medrol, Rocephin, Zithromax, albuterol.  CRITICAL CARE Performed by: Orpah Greek   Total critical care time:  30 minutes  Critical care time was exclusive of separately billable procedures and treating other patients.  Critical care was necessary to treat or prevent imminent or life-threatening deterioration.  Critical care was time spent personally by me on the following activities: development of treatment plan with patient and/or surrogate as well as nursing, discussions with consultants, evaluation of patient's response to treatment, examination of patient, obtaining history from patient or surrogate, ordering and performing treatments and interventions, ordering and review of laboratory studies, ordering and review of radiographic studies, pulse oximetry and re-evaluation of patient's condition.         Final Clinical Impression(s) / ED Diagnoses Final diagnoses:  Acute respiratory failure with hypoxia [facility])    Rx / DC Orders ED Discharge Orders     None         Jasdeep Kepner, Gwenyth Allegra, MD 10/28/21 [phone]

## 2021-10-28 NOTE — Progress Notes (Signed)
Patient seen and examined; admitted after midnight secondary to worsening shortness of breath, productive cough and general malaise.  Increased work of breathing appreciated to the point of requiring the use of BiPAP to assist with respiratory distress.  Work-up demonstrating acute exacerbation of COPD with right lung bronchiectasis/pneumonia.  Please refer to H&P written by Dr. Josephine Cables on 10/28/2021 for further info/details on admission.  Plan: -continue aggressive bronchodilator management, steroids and antibiotics -wean off oxygen supplementation and follow response  Barton Dubois MD [phone]

## 2021-10-28 NOTE — ED Notes (Signed)
Pt was given breakfast tray and is now eating.

## 2021-10-29 ENCOUNTER — Encounter (HOSPITAL_COMMUNITY): Payer: Self-pay | Admitting: Internal Medicine

## 2021-10-29 DIAGNOSIS — J441 Chronic obstructive pulmonary disease with (acute) exacerbation: Secondary | ICD-10-CM | POA: Diagnosis not present

## 2021-10-29 DIAGNOSIS — J9601 Acute respiratory failure with hypoxia: Secondary | ICD-10-CM | POA: Diagnosis not present

## 2021-10-29 DIAGNOSIS — I10 Essential (primary) hypertension: Secondary | ICD-10-CM | POA: Diagnosis not present

## 2021-10-29 DIAGNOSIS — J189 Pneumonia, unspecified organism: Secondary | ICD-10-CM | POA: Diagnosis not present

## 2021-10-29 DIAGNOSIS — E669 Obesity, unspecified: Secondary | ICD-10-CM

## 2021-10-29 LAB — CBC
HCT: 41.5 % (ref 39.0–52.0)
Hemoglobin: 13.2 g/dL (ref 13.0–17.0)
MCH: 24.9 pg — ABNORMAL LOW (ref 26.0–34.0)
MCHC: 31.8 g/dL (ref 30.0–36.0)
MCV: 78.2 fL — ABNORMAL LOW (ref 80.0–100.0)
Platelets: 273 10*3/uL (ref 150–400)
RBC: 5.31 MIL/uL (ref 4.22–5.81)
RDW: 16.4 % — ABNORMAL HIGH (ref 11.5–15.5)
WBC: 11.3 10*3/uL — ABNORMAL HIGH (ref 4.0–10.5)
nRBC: 0 % (ref 0.0–0.2)

## 2021-10-29 LAB — COMPREHENSIVE METABOLIC PANEL
ALT: 31 U/L (ref 0–44)
AST: 24 U/L (ref 15–41)
Albumin: 4 g/dL (ref 3.5–5.0)
Alkaline Phosphatase: 83 U/L (ref 38–126)
Anion gap: 10 (ref 5–15)
BUN: 25 mg/dL — ABNORMAL HIGH (ref 6–20)
CO2: 24 mmol/L (ref 22–32)
Calcium: 9.5 mg/dL (ref 8.9–10.3)
Chloride: 103 mmol/L (ref 98–111)
Creatinine, Ser: 0.94 mg/dL (ref 0.61–1.24)
GFR, Estimated: >60 mL/min (ref >60)
Glucose, Bld: 121 mg/dL — ABNORMAL HIGH (ref 70–99)
Potassium: 3.9 mmol/L (ref 3.5–5.1)
Sodium: 137 mmol/L (ref 135–145)
Total Bilirubin: 0.9 mg/dL (ref 0.3–1.2)
Total Protein: 7.4 g/dL (ref 6.5–8.1)

## 2021-10-29 LAB — APTT: aPTT: 27 seconds (ref 24–36)

## 2021-10-29 LAB — LEGIONELLA PNEUMOPHILA SEROGP 1 UR AG: L. pneumophila Serogp 1 Ur Ag: NEGATIVE

## 2021-10-29 MED ORDER — IPRATROPIUM-ALBUTEROL 0.5-2.5 (3) MG/3ML IN SOLN
3.0000 mL | Freq: Three times a day (TID) | RESPIRATORY_TRACT | Status: DC
  Administered 2021-10-30: 3 mL via RESPIRATORY_TRACT
  Filled 2021-10-29 (×2): qty 3

## 2021-10-29 NOTE — Assessment & Plan Note (Addendum)
-  Most likely in the setting of his steroids usage -Check A1c and follow CBGs as an outpatient. -No insulin therapy required while inpatient, as CBGs remain less than 200.

## 2021-10-29 NOTE — Assessment & Plan Note (Addendum)
-  Low calorie diet, portion control and increase physical activity discussed with patient. -Body mass index is 31.69 kg/m.

## 2021-10-29 NOTE — Assessment & Plan Note (Addendum)
-  Cultures without growth or isolated microorganisms at time of discharge. -Presumed to be bacterial in nature -Complete oral antibiotics as instructed. -Repeat chest x-ray in 6-8 weeks to assure resolution of infiltrates.

## 2021-10-29 NOTE — Progress Notes (Signed)
Progress Note   Patient: Derek Pacheco HCW:[id number] DOB: [birth year] DOA: 10/28/2021     1 DOS: the patient was seen and examined on 10/29/2021   Brief hospital admission course: As per H&P written by Dr. Josephine Cables on 10/28/2021 Derek Pacheco is a 56 y.o. male with medical history significant of hypertension, OSA on CPAP, hypertension, GERD, migraine, COPD who presents to the emergency department via EMS due to about 1 week of mild shortness of breath which progressively worsened yesterday in the evening around 6 PM, this was associated with some chest pain which has since self resolved.  He attempted home breathing treatments without any alleviation, so EMS was activated and on arrival of EMS team, he was noted to be hypoxic and was placed on 6 LPM of oxygen via [place] with O2 sat at 88% on arrival to the ED per ED physician.  He endorsed occasional nonproductive cough mostly at night, but denies fever, chills, headache, nausea, vomiting, abdominal pain.   ED Course:  In the emergency department, he was tachypneic, BP was 172/112, O2 sat was 100% on BiPAP, temperature 98.43F, pulse 98 bpm.  Work-up in the ED showed normal CBC except low MCV, BMP was normal except hyperglycemia, troponin x2 was flat at 6, BNP 20.0.  SARS coronavirus 2 was negative. Chest x-ray showed no active disease CT angiography chest with contrast showed: 1. No evidence of pulmonary embolism. 2. Bronchial wall thickening bilaterally. 3. Focal ground-glass opacity in the right upper lobe measuring 2.9 cm. While findings may be infectious or inflammatory, Adenocarcinoma cannot be excluded. Patient was placed on BiPAP with improved oxygenation, breathing treatment was provided, he was empirically treated for consequent pneumonia, Solu-Medrol 125 mg x 1 was given, Zofran was given.  Hospitalist was asked to admit patient for further evaluation and management.  Assessment and Plan: * Acute respiratory failure with hypoxia (HCC) -  Oxygen saturation 88-89 on room air; at time of admission with increased labored breathing and significant tachypnea requiring the use of BiPAP. -Acute respiratory distress appears to be secondary to bronchiectasis/pneumonia and COPD exacerbation. -Continue to wean oxygen supplementation as tolerated -Continue treatment with steroids, bronchodilators management, mucolytic's, flutter valve and IV antibiotics. -Follow clinical response. -Check desaturation screening.  Obesity, Class I, BMI 30-34.9 - Low calorie diet, portion control and increase physical activity discussed with patient. -Body mass index is 31.69 kg/m.   Hyperglycemia - Most likely in the setting of his steroids usage -Will provide a sliding scale insulin if CBGs above 200.  OSA on CPAP - Continue CPAP nightly.  GERD (gastroesophageal reflux disease) - Continue the use of PPI. -Lifestyle changes discussed with patient.  Essential hypertension -Stable overall -Continue current antihypertensive regimen -Heart healthy diet discussed with patient.  Low mean corpuscular volume (MCV) -Stable hemoglobin appreciated -Outpatient follow-up with PCP and if needed with hematology service.  CAP (community acquired pneumonia) - Follow culture results -Presumed to be bacterial in nature -Continue current antibiotics. -Repeat chest x-ray in 6-8 weeks to assure resolution of infiltrates.  COPD exacerbation ([place]) - As mentioned above we will continue management with the steroids and bronchodilator -Mucolytic's and flutter valve has been also ordered.    Subjective:  Demonstrating mild difficulty speaking in full sentences and complaining of short winded sensation with activity.  2 L supplementation in place to keep saturation above 90% (on room air saturation down to 89%.)  Currently afebrile and overall feeling better.  Physical Exam: Vitals:   10/29/21 0509 10/29/21 0719 10/29/21  1451 10/29/21 1455  BP: (!) 145/97   (!) 135/90   Pulse: 93  99   Resp: 20  18   Temp: 98 F (36.7 C)  97.7 F (36.5 C)   TempSrc:   Oral   SpO2: 97% 98% 93% 95%  Weight:      Height:       General exam: Alert, awake, oriented x 3; demonstrating mild difficulty speaking in full sentences, requiring oxygen saturation 2 L supplementation and feeling short winded with activity.  Reports intermittent productive coughing spells.  Afebrile. Respiratory system: Bilateral rhonchi and expiratory wheezing appreciated; no using accessory muscles at rest.  Good saturation with current supplementation. Cardiovascular system:RRR. No rubs, gallops, murmurs or JVD on exam. Gastrointestinal system: Abdomen is nondistended, soft and nontender. No organomegaly or masses felt. Normal bowel sounds heard. Central nervous system: Alert and oriented. No focal neurological deficits. Extremities: No cyanosis or clubbing. Skin: No petechiae. Psychiatry: Judgement and insight appear normal. Mood & affect appropriate.   Data Reviewed: Comprehensive metabolic panel: Sodium 509, potassium 3.9, chloride 103, bicarb 24, BUN 25, creatinine 0.94 and normal LFTs. -CBC: White blood cells 11.3, hemoglobin 13.2, platelets count 273 K.  Family Communication: Wife at bedside.  Disposition: Status is: Inpatient Remains inpatient appropriate because: Continues to demonstrate short winded sensation with activity and having mild difficulty speaking in full sentences.  2 L nasal cannula supplementation in place.  Continue treatment with IV antibiotics and steroids.   Planned Discharge Destination: Home   Author: Barton Dubois, MD 10/29/2021 6:22 PM  For on call review [email or web address].

## 2021-10-29 NOTE — Plan of Care (Signed)

## 2021-10-29 NOTE — Assessment & Plan Note (Signed)
-  Stable overall -Continue current antihypertensive regimen -Heart healthy diet discussed with patient.

## 2021-10-29 NOTE — TOC Progression Note (Signed)
  Transition of Care [facility]) Screening Note   Patient Details  Name: [name] Date of Birth: [birth year]   Transition of Care [facility]) CM/SW Contact:    Boneta Lucks, RN Phone Number: 10/29/2021, 11:28 AM  On 4L oxygen - weaning - TOC will follow.  Transition of Care Department [facility]) has reviewed patient and no TOC needs have been identified at this time. We will continue to monitor patient advancement through interdisciplinary progression rounds. If new patient transition needs arise, please place a TOC consult.    Expected Discharge Plan: Home/Self Care Barriers to Discharge: Continued Medical Work up  Expected Discharge Plan and Services Expected Discharge Plan: Home/Self Care       Living arrangements for the past 2 months: [place]

## 2021-10-29 NOTE — Assessment & Plan Note (Addendum)
-  As mentioned above we will continue management with antibiotics, steroids and bronchodilators. (Resuming home inhaler regimen at discharge). -Mucolytic's and flutter valve has been also ordered.

## 2021-10-29 NOTE — Assessment & Plan Note (Addendum)
-  Continue the use of PPI. -Lifestyle changes discussed with patient.

## 2021-10-29 NOTE — Assessment & Plan Note (Addendum)
Continue CPAP nightly. °

## 2021-10-29 NOTE — Assessment & Plan Note (Addendum)
-  Oxygen saturation 88-89 on room air at time of admission with increased labored breathing and significant tachypnea requiring the use of BiPAP. -Acute respiratory distress appears to be secondary to bronchiectasis/pneumonia and COPD exacerbation. -Excellent response to the use of his steroids, antibiotics, nebulizer management, flutter valve and mucolytic's. -At discharge no oxygen requirement needed -Antibiotics transition to oral route and patient instructed to taper off steroids. -Outpatient follow-up with PCP and pulmonologist recommended. -Resume home bronchodilator management and the use of montelukast.

## 2021-10-29 NOTE — Progress Notes (Signed)
Received patient from ICU. Gave patient his 0200 breathing treatment and then placed on dreamstation BIPAP for the rest of the night. Patient doing well at this time.

## 2021-10-29 NOTE — Progress Notes (Signed)
When speaking with patient about his machine at home, patient stated that his CPAP at home is set on pressure of 5.  He took the machine off and put back on Derek Pacheco last night due to high pressure.  Machine was set on Bilevel, so I put machine on his normal at home settings.

## 2021-10-29 NOTE — Progress Notes (Signed)
SATURATION QUALIFICATIONS: (This note is used to comply with regulatory documentation for home oxygen)  Patient Saturations on Room Air at Rest = 94%  Patient Saturations on Room Air while Ambulating = 89-91%  Patient Saturations on Liters of oxygen while Ambulating = %  Please briefly explain why patient needs home oxygen: Patient would not need home oxygen at this time, his oxygen remained above 90 for most of the time walking in hallway , and was 94 % R. A.

## 2021-10-29 NOTE — Assessment & Plan Note (Signed)
-  Stable hemoglobin appreciated -Outpatient follow-up with PCP and if needed with hematology service.

## 2021-10-30 DIAGNOSIS — J189 Pneumonia, unspecified organism: Secondary | ICD-10-CM | POA: Diagnosis not present

## 2021-10-30 DIAGNOSIS — I10 Essential (primary) hypertension: Secondary | ICD-10-CM | POA: Diagnosis not present

## 2021-10-30 DIAGNOSIS — J441 Chronic obstructive pulmonary disease with (acute) exacerbation: Secondary | ICD-10-CM | POA: Diagnosis not present

## 2021-10-30 DIAGNOSIS — J9601 Acute respiratory failure with hypoxia: Secondary | ICD-10-CM | POA: Diagnosis not present

## 2021-10-30 MED ORDER — PREDNISONE 20 MG PO TABS: ORAL_TABLET | ORAL | 0 refills | Status: DC

## 2021-10-30 MED ORDER — CEFDINIR 300 MG PO CAPS: 300.0000 mg | ORAL_CAPSULE | Freq: Two times a day (BID) | ORAL | 0 refills | Status: AC

## 2021-10-30 MED ORDER — DM-GUAIFENESIN ER 30-600 MG PO TB12: 1.0000 | ORAL_TABLET | Freq: Two times a day (BID) | ORAL | 0 refills | Status: AC

## 2021-10-30 MED ORDER — AMLODIPINE BESYLATE 5 MG PO TABS: 5.0000 mg | ORAL_TABLET | Freq: Every day | ORAL | 2 refills | Status: DC

## 2021-10-30 MED ORDER — PANTOPRAZOLE SODIUM 40 MG PO TBEC: 40.0000 mg | DELAYED_RELEASE_TABLET | Freq: Two times a day (BID) | ORAL | 2 refills | Status: AC

## 2021-10-30 NOTE — Progress Notes (Signed)
SATURATION QUALIFICATIONS:  Patient Saturations on Room Air at Rest = 95%  Patient Saturations on Room Air while Ambulating = 92-94%  No need for oxygen

## 2021-10-30 NOTE — Discharge Summary (Signed)
Physician Discharge Summary   Patient: Derek Pacheco MRN: [id number] DOB: 1965-09-19  Admit date:     10/28/2021  Discharge date: 10/30/21  Discharge Physician: Barton Dubois   PCP: Tereasa Coop, PA-C   Recommendations at discharge:  Repeat basic metabolic panel to follow electrolytes and renal function Repeat chest x-ray in 6-8 weeks to assure complete resolution of infiltrates Continue assisting patient with weight management Make sure patient has follow-up with pulmonologist as instructed for repeat PFTs and further adjustment to his COPD maintenance regimen. Given episodes of hypoglycemia while receiving steroids would recommend checking A1c and follow CBGs trend.   Discharge Diagnoses: Principal Problem:   Acute respiratory failure with hypoxia ([place]) Active Problems:   COPD exacerbation (HCC)   CAP (community acquired pneumonia)   Low mean corpuscular volume (MCV)   Essential hypertension   GERD (gastroesophageal reflux disease)   OSA on CPAP   Hyperglycemia   Obesity, Class I, BMI 30-34.9  Brief hospital admission course: As per H&P written by Dr. Josephine Cables on 10/28/2021 Derek Pacheco is a 56 y.o. male with medical history significant of hypertension, OSA on CPAP, hypertension, GERD, migraine, COPD who presents to the emergency department via EMS due to about 1 week of mild shortness of breath which progressively worsened yesterday in the evening around 6 PM, this was associated with some chest pain which has since self resolved.  He attempted home breathing treatments without any alleviation, so EMS was activated and on arrival of EMS team, he was noted to be hypoxic and was placed on 6 LPM of oxygen via [place] with O2 sat at 88% on arrival to the ED per ED physician.  He endorsed occasional nonproductive cough mostly at night, but denies fever, chills, headache, nausea, vomiting, abdominal pain.   Assessment and Plan: * Acute respiratory failure with hypoxia (HCC) -Oxygen  saturation 88-89 on room air at time of admission with increased labored breathing and significant tachypnea requiring the use of BiPAP. -Acute respiratory distress appears to be secondary to bronchiectasis/pneumonia and COPD exacerbation. -Excellent response to the use of his steroids, antibiotics, nebulizer management, flutter valve and mucolytic's. -At discharge no oxygen requirement needed -Antibiotics transition to oral route and patient instructed to taper off steroids. -Outpatient follow-up with PCP and pulmonologist recommended. -Resume home bronchodilator management and the use of montelukast.  Obesity, Class I, BMI 30-34.9 -Low calorie diet, portion control and increase physical activity discussed with patient. -Body mass index is 31.69 kg/m.   Hyperglycemia -Most likely in the setting of his steroids usage -Check A1c and follow CBGs as an outpatient. -No insulin therapy required while inpatient, as CBGs remain less than 200.  OSA on CPAP -Continue CPAP nightly.  GERD (gastroesophageal reflux disease) -Continue the use of PPI. -Lifestyle changes discussed with patient.  Essential hypertension -Stable overall -Continue current antihypertensive regimen -Heart healthy diet discussed with patient.  Low mean corpuscular volume (MCV) -Stable hemoglobin appreciated -Outpatient follow-up with PCP and if needed with hematology service.  CAP (community acquired pneumonia) -Cultures without growth or isolated microorganisms at time of discharge. -Presumed to be bacterial in nature -Complete oral antibiotics as instructed. -Repeat chest x-ray in 6-8 weeks to assure resolution of infiltrates.  COPD exacerbation ([place]) -As mentioned above we will continue management with antibiotics, steroids and bronchodilators. (Resuming home inhaler regimen at discharge). -Mucolytic's and flutter valve has been also ordered.   Consultants: None Procedures performed: See below for x-ray  reports. Disposition: Home Diet recommendation: Heart healthy diet.  DISCHARGE MEDICATION: Allergies as of 10/30/2021   No Known Allergies      Medication List     TAKE these medications    albuterol (2.5 MG/3ML) 0.083% nebulizer solution Commonly known as: PROVENTIL Take 3 mLs (2.5 mg total) by nebulization every 6 (six) hours as needed for wheezing. Please place on filer for patient.   alprazolam 2 MG tablet Commonly known as: XANAX Take 1 tablet (2 mg total) by mouth at bedtime as needed for anxiety. Do not use this without using you CPAP.   amLODipine 5 MG tablet Commonly known as: NORVASC Take 1 tablet (5 mg total) by mouth daily. What changed: Another medication with the same name was removed. Continue taking this medication, and follow the directions you see here.   budesonide-formoterol 160-4.5 MCG/ACT inhaler Commonly known as: SYMBICORT Inhale 2 puffs into the lungs 2 (two) times daily.   cefdinir 300 MG capsule Commonly known as: OMNICEF Take 1 capsule (300 mg total) by mouth every 12 (twelve) hours for 5 days.   dextromethorphan-guaiFENesin 30-600 MG 12hr tablet Commonly known as: MUCINEX DM Take 1 tablet by mouth 2 (two) times daily for 7 days.   ibuprofen 600 MG tablet Commonly known as: ADVIL Take 600 mg by mouth every 6 (six) hours as needed for pain.   ipratropium 0.02 % nebulizer solution Commonly known as: ATROVENT Take 2.5 mLs (0.5 mg total) by nebulization 4 (four) times daily.   montelukast 10 MG tablet Commonly known as: SINGULAIR Take 1 tablet (10 mg total) by mouth at bedtime.   Otezla 30 MG Tabs Generic drug: Apremilast Take 1 tablet by mouth 2 (two) times daily.   pantoprazole 40 MG tablet Commonly known as: PROTONIX Take 1 tablet (40 mg total) by mouth 2 (two) times daily. What changed: See the new instructions.   Peak Flow Meter Devi Commonly known as: Airzone Peak Flow Meter Please use daily to keep track of your breathing.    predniSONE 20 MG tablet Commonly known as: DELTASONE Take 3 tablets by mouth daily times a day; then 2 tablets by mouth daily x2 days; then 1 tablet by mouth daily x3 days; then half tablet by mouth daily x3 days and stop prednisone.   promethazine 12.5 MG tablet Commonly known as: PHENERGAN Take 12.5 mg by mouth as needed for nausea or vomiting.   SUMAtriptan 100 MG tablet Commonly known as: IMITREX TAKE 1/2 TO 1 (ONE-HALF TO ONE) TABLET BY MOUTH AS NEEDED FOR  MIGRAINE -- **MAXIMUM OF 200 MG IN 24 HOURS**        Follow-up Information     Tereasa Coop, PA-C. Schedule an appointment as soon as possible for a visit in 10 day(s).   Specialty: Urgent Care Contact information: [address] [place] [address] [phone]                Discharge Exam: Danley Danker Weights   10/28/21 0222 10/28/21 1005  Weight: 125 kg 106 kg   General exam: Alert, awake, oriented x 3; speaking in full sentences and no requiring oxygen supplementation. Respiratory system: Mild expiratory wheezing and scattered rhonchi appreciated; no requiring oxygen supplementation, no using accessory muscle.  Feeling ready to go home. Cardiovascular system:RRR. No murmurs, rubs, gallops. Gastrointestinal system: Abdomen is obese, nondistended, soft and nontender. No organomegaly or masses felt. Normal bowel sounds heard. Central nervous system: Alert and oriented. No focal neurological deficits. Extremities: No cyanosis or clubbing. Skin: No rashes, petechiae. Psychiatry: Judgement  and insight appear normal. Mood & affect appropriate.    Condition at discharge: Stable and improved.  The results of significant diagnostics from this hospitalization (including imaging, microbiology, ancillary and laboratory) are listed below for reference.   Imaging Studies: CT Angio Chest Pulmonary Embolism (PE) W or WO Contrast  Result Date: 10/28/2021 CLINICAL DATA:  Pulmonary embolism suspected,  high probability. Chest pain and shortness of breath. EXAM: CT ANGIOGRAPHY CHEST WITH CONTRAST TECHNIQUE: Multidetector CT imaging of the chest was performed using the standard protocol during bolus administration of intravenous contrast. Multiplanar CT image reconstructions and MIPs were obtained to evaluate the vascular anatomy. RADIATION DOSE REDUCTION: This exam was performed according to the departmental dose-optimization program which includes automated exposure control, adjustment of the mA and/or kV according to patient size and/or use of iterative reconstruction technique. CONTRAST:  6m OMNIPAQUE IOHEXOL 350 MG/ML SOLN COMPARISON:  08/14/2009. FINDINGS: Cardiovascular: The heart is normal in size and there is no pericardial effusion. 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 The pulmonary trunk is normal in caliber. No pulmonary artery filling defect is identified. Evaluation of the pulmonary arteries at the lung bases is limited due to respiratory motion artifact. Mediastinum/Nodes: No mediastinal or axillary lymphadenopathy. A prominent nonspecific lymph node is present at the left hilum measuring 1.1 cm in short axis diameter. The thyroid gland, trachea, and esophagus are within normal limits. Lungs/Pleura: Mild bronchial wall thickening is present bilaterally. A focal ground-glass opacity is noted in the right upper lobe measuring 2.9 cm, axial image 89. No consolidation, effusion, or pneumothorax. Upper Abdomen: No acute abnormality. Musculoskeletal: Degenerative changes in the thoracic spine. No acute osseous abnormality. Review of the MIP images confirms the above findings. IMPRESSION: 1. No evidence of pulmonary embolism. 2. Bronchial wall thickening bilaterally. 3. Focal ground-glass opacity in the right upper lobe measuring 2.9 cm. While findings may be infectious or inflammatory, Adenocarcinoma cannot be excluded. Follow up by CT is recommended in  12 months, with continued annual surveillance for a minimum of 3 years. These recommendations are taken from: Recommendations for the Management of Subsolid Pulmonary Nodules Detected at CT: A Statement from the [facility] 2013; 266:1, 304-317. 4. 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 Recommend annual imaging followup by CTA or MRA. This recommendation follows 2010 ACCF/AHA/AATS/ACR/ASA/SCA/SCAI/SIR/STS/SVM Guidelines for the Diagnosis and Management of Patients with Thoracic Aortic Disease. Circulation. 2010; 121:: Q259-D638 Aortic aneurysm NOS (ICD10-I71.9) Electronically Signed   By: LBrett FairyM.D.   On: 10/28/2021 04:36   DG Chest Portable 1 View  Result Date: 10/28/2021 CLINICAL DATA:  Respiratory distress for 2 days EXAM: PORTABLE CHEST 1 VIEW COMPARISON:  04/09/2016 FINDINGS: The heart size and mediastinal contours are within normal limits. Both lungs are clear. The visualized skeletal structures are unremarkable. IMPRESSION: No active disease. Electronically Signed   By: MInez CatalinaM.D.   On: 10/28/2021 02:54    Microbiology: Results for orders placed or performed during the hospital encounter of 10/28/21  SARS Coronavirus 2 by RT PCR (hospital order[facility]) *cepheid single result test* Anterior Nasal Swab     Status: None   Collection Time: 10/28/21  2:37 AM   Specimen: Anterior Nasal Swab  Result Value Ref Range Status   SARS Coronavirus 2 by RT PCR NEGATIVE NEGATIVE Final    Comment: (NOTE) SARS-CoV-2 target nucleic acids are NOT DETECTED.  The SARS-CoV-2 RNA is generally detectable in upper  and lower respiratory specimens during the acute phase of infection. The lowest concentration of SARS-CoV-2 viral copies this assay can detect is 250 copies / mL. A negative result does not preclude SARS-CoV-2 infection and should not be used as the sole basis for treatment or other patient management decisions.  A  negative result may occur with improper specimen collection / handling, submission of specimen other than nasopharyngeal swab, presence of viral mutation(s) within the areas targeted by this assay, and inadequate number of viral copies (<250 copies / mL). A negative result must be combined with clinical observations, patient history, and epidemiological information.  Fact Sheet for Patients:   https://www.patel.info/  Fact Sheet for Healthcare Providers: https://hall.com/  This test is not yet approved or  cleared by the [place] FDA and has been authorized for detection and/or diagnosis of SARS-CoV-2 by FDA under an Emergency Use Authorization (EUA).  This EUA will remain in effect (meaning this test can be used) for the duration of the COVID-19 declaration under Section 564(b)(1) of the Act, 21 U.S.C. section 360bbb-3(b)(1), unless the authorization is terminated or revoked sooner.  Performed at [facility], [address] [place], [place] [address]   Culture, blood (Routine X 2) w Reflex to ID Panel     Status: None (Preliminary result)   Collection Time: 10/28/21  6:37 AM   Specimen: BLOOD LEFT HAND  Result Value Ref Range Status   Specimen Description BLOOD LEFT HAND  Final   Special Requests   Final    BOTTLES DRAWN AEROBIC AND ANAEROBIC Blood Culture adequate volume   Culture   Final    NO GROWTH 2 DAYS Performed at [facility], [address], [place], [place] [address]    Report Status PENDING  Incomplete  Culture, blood (Routine X 2) w Reflex to ID Panel     Status: None (Preliminary result)   Collection Time: 10/28/21  6:38 AM   Specimen: BLOOD LEFT ARM  Result Value Ref Range Status   Specimen Description BLOOD LEFT ARM  Final   Special Requests   Final    BOTTLES DRAWN AEROBIC AND ANAEROBIC Blood Culture adequate volume   Culture   Final    NO GROWTH 2 DAYS Performed at [facility], [address],  [place], [place] [address]    Report Status PENDING  Incomplete  MRSA Next Gen by PCR, Nasal     Status: None   Collection Time: 10/28/21 10:17 AM   Specimen: Nasal Mucosa; Nasal Swab  Result Value Ref Range Status   MRSA by PCR Next Gen NOT DETECTED NOT DETECTED Final    Comment: (NOTE) The GeneXpert MRSA Assay (FDA approved for NASAL specimens only), is one component of a comprehensive MRSA colonization surveillance program. It is not intended to diagnose MRSA infection nor to guide or monitor treatment for MRSA infections. Test performance is not FDA approved in patients less than 39 years old. Performed at [facility], [address], [place], [place] [address]     Labs: CBC: Recent Labs  Lab 10/28/21 0229 10/29/21 0441  WBC 8.3 11.3*  HGB 14.1 13.2  HCT 44.2 41.5  MCV 78.0* 78.2*  PLT 287 672   Basic Metabolic Panel: Recent Labs  Lab 10/28/21 0229 10/28/21 0509 10/29/21 0441  NA 137  --  137  K 3.9  --  3.9  CL 105  --  103  CO2 23  --  24  GLUCOSE 160*  --  121*  BUN  14  --  25*  CREATININE 0.90  --  0.94  CALCIUM 8.8*  --  9.5  MG  --  2.1  --   PHOS  --  4.1  --    Liver Function Tests: Recent Labs  Lab 10/29/21 0441  AST 24  ALT 31  ALKPHOS 83  BILITOT 0.9  PROT 7.4  ALBUMIN 4.0   CBG: No results for input(s): "GLUCAP" in the last 168 hours.  Discharge time spent: greater than 30 minutes.  Signed: Barton Dubois, MD [facility] 10/30/2021

## 2021-11-03 LAB — CULTURE, BLOOD (ROUTINE X 2)
Culture: NO GROWTH
Culture: NO GROWTH
Special Requests: ADEQUATE
Special Requests: ADEQUATE

## 2022-04-02 DIAGNOSIS — J441 Chronic obstructive pulmonary disease with (acute) exacerbation: Secondary | ICD-10-CM | POA: Diagnosis not present

## 2022-04-14 ENCOUNTER — Encounter: Payer: Self-pay | Admitting: Gastroenterology

## 2022-05-03 DIAGNOSIS — I7121 Aneurysm of the ascending aorta, without rupture: Secondary | ICD-10-CM | POA: Diagnosis not present

## 2022-06-18 ENCOUNTER — Inpatient Hospital Stay (HOSPITAL_COMMUNITY)
Admission: EM | Admit: 2022-06-18 | Discharge: 2022-06-20 | DRG: 190 | Disposition: A | Discharge status: Home / Self Care | Payer: 59 | Attending: Internal Medicine | Admitting: Internal Medicine

## 2022-06-18 ENCOUNTER — Encounter (HOSPITAL_COMMUNITY): Payer: Self-pay

## 2022-06-18 ENCOUNTER — Other Ambulatory Visit: Payer: Self-pay

## 2022-06-18 ENCOUNTER — Emergency Department (HOSPITAL_COMMUNITY): Payer: 59

## 2022-06-18 DIAGNOSIS — Z79899 Other long term (current) drug therapy: Secondary | ICD-10-CM

## 2022-06-18 DIAGNOSIS — Z87891 Personal history of nicotine dependence: Secondary | ICD-10-CM

## 2022-06-18 DIAGNOSIS — Z6833 Body mass index (BMI) 33.0-33.9, adult: Secondary | ICD-10-CM

## 2022-06-18 DIAGNOSIS — Z1152 Encounter for screening for COVID-19: Secondary | ICD-10-CM

## 2022-06-18 DIAGNOSIS — Z7951 Long term (current) use of inhaled steroids: Secondary | ICD-10-CM

## 2022-06-18 DIAGNOSIS — Z825 Family history of asthma and other chronic lower respiratory diseases: Secondary | ICD-10-CM

## 2022-06-18 DIAGNOSIS — J9601 Acute respiratory failure with hypoxia: Secondary | ICD-10-CM | POA: Diagnosis present

## 2022-06-18 DIAGNOSIS — I10 Essential (primary) hypertension: Secondary | ICD-10-CM | POA: Diagnosis present

## 2022-06-18 DIAGNOSIS — R0602 Shortness of breath: Secondary | ICD-10-CM | POA: Diagnosis not present

## 2022-06-18 DIAGNOSIS — K219 Gastro-esophageal reflux disease without esophagitis: Secondary | ICD-10-CM | POA: Diagnosis present

## 2022-06-18 DIAGNOSIS — G4733 Obstructive sleep apnea (adult) (pediatric): Secondary | ICD-10-CM | POA: Diagnosis present

## 2022-06-18 DIAGNOSIS — J441 Chronic obstructive pulmonary disease with (acute) exacerbation: Secondary | ICD-10-CM | POA: Diagnosis not present

## 2022-06-18 DIAGNOSIS — E669 Obesity, unspecified: Secondary | ICD-10-CM | POA: Diagnosis present

## 2022-06-18 DIAGNOSIS — R079 Chest pain, unspecified: Secondary | ICD-10-CM | POA: Diagnosis not present

## 2022-06-18 HISTORY — DX: Chronic obstructive pulmonary disease, unspecified: J44.9

## 2022-06-18 LAB — CBC WITH DIFFERENTIAL/PLATELET
Abs Immature Granulocytes: 0.01 10*3/uL (ref 0.00–0.07)
Basophils Absolute: 0.1 10*3/uL (ref 0.0–0.1)
Basophils Relative: 2 %
Eosinophils Absolute: 1.1 10*3/uL — ABNORMAL HIGH (ref 0.0–0.5)
Eosinophils Relative: 16 %
HCT: 46.3 % (ref 39.0–52.0)
Hemoglobin: 14.9 g/dL (ref 13.0–17.0)
Immature Granulocytes: 0 %
Lymphocytes Relative: 12 %
Lymphs Abs: 0.8 10*3/uL (ref 0.7–4.0)
MCH: 26.5 pg (ref 26.0–34.0)
MCHC: 32.2 g/dL (ref 30.0–36.0)
MCV: 82.2 fL (ref 80.0–100.0)
Monocytes Absolute: 0.6 10*3/uL (ref 0.1–1.0)
Monocytes Relative: 9 %
Neutro Abs: 4.3 10*3/uL (ref 1.7–7.7)
Neutrophils Relative %: 61 %
Platelets: 246 10*3/uL (ref 150–400)
RBC: 5.63 MIL/uL (ref 4.22–5.81)
RDW: 15.4 % (ref 11.5–15.5)
WBC: 7.1 10*3/uL (ref 4.0–10.5)
nRBC: 0 % (ref 0.0–0.2)

## 2022-06-18 LAB — COMPREHENSIVE METABOLIC PANEL
ALT: 33 U/L (ref 0–44)
AST: 26 U/L (ref 15–41)
Albumin: 4.7 g/dL (ref 3.5–5.0)
Alkaline Phosphatase: 115 U/L (ref 38–126)
Anion gap: 9 (ref 5–15)
BUN: 14 mg/dL (ref 6–20)
CO2: 26 mmol/L (ref 22–32)
Calcium: 9.1 mg/dL (ref 8.9–10.3)
Chloride: 103 mmol/L (ref 98–111)
Creatinine, Ser: 0.91 mg/dL (ref 0.61–1.24)
GFR, Estimated: >60 mL/min (ref >60)
Glucose, Bld: 121 mg/dL — ABNORMAL HIGH (ref 70–99)
Potassium: 3.9 mmol/L (ref 3.5–5.1)
Sodium: 138 mmol/L (ref 135–145)
Total Bilirubin: 0.6 mg/dL (ref 0.3–1.2)
Total Protein: 8.3 g/dL — ABNORMAL HIGH (ref 6.5–8.1)

## 2022-06-18 LAB — BRAIN NATRIURETIC PEPTIDE: B Natriuretic Peptide: 11 pg/mL (ref 0.0–100.0)

## 2022-06-18 LAB — TROPONIN I (HIGH SENSITIVITY)
Troponin I (High Sensitivity): 6 ng/L (ref <18)
Troponin I (High Sensitivity): 8 ng/L (ref <18)

## 2022-06-18 LAB — RESP PANEL BY RT-PCR (RSV, FLU A&B, COVID)  RVPGX2
Influenza A by PCR: NEGATIVE
Influenza B by PCR: NEGATIVE
Resp Syncytial Virus by PCR: NEGATIVE
SARS Coronavirus 2 by RT PCR: NEGATIVE

## 2022-06-18 MED ORDER — INFLUENZA VAC SPLIT QUAD 0.5 ML IM SUSY: 0.5000 mL | PREFILLED_SYRINGE | INTRAMUSCULAR | Status: DC | PRN

## 2022-06-18 MED ORDER — ORAL CARE MOUTH RINSE: 15.0000 mL | OROMUCOSAL | Status: DC | PRN

## 2022-06-18 MED ORDER — IPRATROPIUM-ALBUTEROL 0.5-2.5 (3) MG/3ML IN SOLN
3.0000 mL | Freq: Once | RESPIRATORY_TRACT | Status: AC
  Administered 2022-06-18: 3 mL via RESPIRATORY_TRACT
  Filled 2022-06-18: qty 3

## 2022-06-18 MED ORDER — IPRATROPIUM-ALBUTEROL 0.5-2.5 (3) MG/3ML IN SOLN: 3.0000 mL | Freq: Once | RESPIRATORY_TRACT | Status: DC

## 2022-06-18 MED ORDER — METHYLPREDNISOLONE SODIUM SUCC 40 MG IJ SOLR
40.0000 mg | Freq: Two times a day (BID) | INTRAMUSCULAR | Status: DC
  Administered 2022-06-18 – 2022-06-19 (×2): 40 mg via INTRAVENOUS
  Filled 2022-06-18 (×2): qty 1

## 2022-06-18 MED ORDER — AMLODIPINE BESYLATE 5 MG PO TABS
5.0000 mg | ORAL_TABLET | Freq: Every day | ORAL | Status: DC
  Administered 2022-06-18 – 2022-06-20 (×3): 5 mg via ORAL
  Filled 2022-06-18 (×3): qty 1

## 2022-06-18 MED ORDER — IPRATROPIUM-ALBUTEROL 0.5-2.5 (3) MG/3ML IN SOLN
3.0000 mL | Freq: Four times a day (QID) | RESPIRATORY_TRACT | Status: DC
  Administered 2022-06-18 – 2022-06-19 (×6): 3 mL via RESPIRATORY_TRACT
  Filled 2022-06-18 (×5): qty 3

## 2022-06-18 MED ORDER — ALBUTEROL SULFATE (2.5 MG/3ML) 0.083% IN NEBU
2.5000 mg | INHALATION_SOLUTION | Freq: Once | RESPIRATORY_TRACT | Status: AC
  Administered 2022-06-18: 2.5 mg via RESPIRATORY_TRACT
  Filled 2022-06-18: qty 3

## 2022-06-18 MED ORDER — ONDANSETRON HCL 4 MG/2ML IJ SOLN: 4.0000 mg | Freq: Four times a day (QID) | INTRAMUSCULAR | Status: DC | PRN

## 2022-06-18 MED ORDER — PNEUMOCOCCAL 20-VAL CONJ VACC 0.5 ML IM SUSY: 0.5000 mL | PREFILLED_SYRINGE | INTRAMUSCULAR | Status: DC | PRN

## 2022-06-18 MED ORDER — ALBUTEROL SULFATE (2.5 MG/3ML) 0.083% IN NEBU: 2.5000 mg | INHALATION_SOLUTION | Freq: Once | RESPIRATORY_TRACT | Status: DC

## 2022-06-18 MED ORDER — PANTOPRAZOLE SODIUM 40 MG PO TBEC
40.0000 mg | DELAYED_RELEASE_TABLET | Freq: Two times a day (BID) | ORAL | Status: DC
  Administered 2022-06-18 – 2022-06-20 (×4): 40 mg via ORAL
  Filled 2022-06-18 (×4): qty 1

## 2022-06-18 MED ORDER — MAGNESIUM SULFATE 2 GM/50ML IV SOLN
2.0000 g | Freq: Once | INTRAVENOUS | Status: AC
  Administered 2022-06-18: 2 g via INTRAVENOUS
  Filled 2022-06-18: qty 50

## 2022-06-18 MED ORDER — MONTELUKAST SODIUM 10 MG PO TABS
10.0000 mg | ORAL_TABLET | Freq: Every day | ORAL | Status: DC
  Administered 2022-06-18 – 2022-06-19 (×2): 10 mg via ORAL
  Filled 2022-06-18 (×2): qty 1

## 2022-06-18 MED ORDER — ACETAMINOPHEN 650 MG RE SUPP: 650.0000 mg | Freq: Four times a day (QID) | RECTAL | Status: DC | PRN

## 2022-06-18 MED ORDER — ENOXAPARIN SODIUM 40 MG/0.4ML IJ SOSY
40.0000 mg | PREFILLED_SYRINGE | INTRAMUSCULAR | Status: DC
  Administered 2022-06-18 – 2022-06-19 (×2): 40 mg via SUBCUTANEOUS
  Filled 2022-06-18 (×2): qty 0.4

## 2022-06-18 MED ORDER — ALBUTEROL SULFATE HFA 108 (90 BASE) MCG/ACT IN AERS: 2.0000 | INHALATION_SPRAY | RESPIRATORY_TRACT | Status: DC | PRN

## 2022-06-18 MED ORDER — PNEUMOCOCCAL 20-VAL CONJ VACC 0.5 ML IM SUSY: 0.5000 mL | PREFILLED_SYRINGE | INTRAMUSCULAR | Status: DC

## 2022-06-18 MED ORDER — ONDANSETRON HCL 4 MG PO TABS: 4.0000 mg | ORAL_TABLET | Freq: Four times a day (QID) | ORAL | Status: DC | PRN

## 2022-06-18 MED ORDER — ALBUTEROL SULFATE (2.5 MG/3ML) 0.083% IN NEBU
2.5000 mg | INHALATION_SOLUTION | RESPIRATORY_TRACT | Status: DC | PRN
  Administered 2022-06-18 – 2022-06-19 (×3): 2.5 mg via RESPIRATORY_TRACT
  Filled 2022-06-18 (×4): qty 3

## 2022-06-18 MED ORDER — METHYLPREDNISOLONE SODIUM SUCC 125 MG IJ SOLR
125.0000 mg | Freq: Once | INTRAMUSCULAR | Status: AC
  Administered 2022-06-18: 125 mg via INTRAVENOUS
  Filled 2022-06-18: qty 2

## 2022-06-18 MED ORDER — BUDESONIDE 0.25 MG/2ML IN SUSP
0.2500 mg | Freq: Two times a day (BID) | RESPIRATORY_TRACT | Status: DC
  Administered 2022-06-18 – 2022-06-20 (×4): 0.25 mg via RESPIRATORY_TRACT
  Filled 2022-06-18 (×4): qty 2

## 2022-06-18 MED ORDER — INFLUENZA VAC SPLIT QUAD 0.5 ML IM SUSY: 0.5000 mL | PREFILLED_SYRINGE | INTRAMUSCULAR | Status: DC

## 2022-06-18 MED ORDER — ALPRAZOLAM 1 MG PO TABS
2.0000 mg | ORAL_TABLET | Freq: Every evening | ORAL | Status: DC | PRN
  Administered 2022-06-18: 2 mg via ORAL
  Filled 2022-06-18: qty 2

## 2022-06-18 MED ORDER — ACETAMINOPHEN 325 MG PO TABS: 650.0000 mg | ORAL_TABLET | Freq: Four times a day (QID) | ORAL | Status: DC | PRN

## 2022-06-18 MED ORDER — IBUPROFEN 600 MG PO TABS
600.0000 mg | ORAL_TABLET | Freq: Four times a day (QID) | ORAL | Status: DC | PRN
  Administered 2022-06-18: 600 mg via ORAL
  Filled 2022-06-18: qty 1

## 2022-06-18 MED ORDER — MAGNESIUM SULFATE 2 GM/50ML IV SOLN
INTRAVENOUS | Status: AC
  Filled 2022-06-18: qty 50

## 2022-06-18 NOTE — ED Notes (Signed)
Respiratory at bedside, pt placed on Bi-pap

## 2022-06-18 NOTE — ED Provider Notes (Signed)
[facility] Provider Note   CSN: [id number] Arrival date & time: 06/18/22  [id number]     History  Chief Complaint  Patient presents with   Shortness of Breath   Chest Pain    Derek Pacheco is a 57 y.o. male.  Patient complains of shortness of breath.  Patient has a history of COPD.  Patient was admitted last fall for COPD  The history is provided by the patient and medical records. No language interpreter was used.  Shortness of Breath Severity:  Moderate Onset quality:  Sudden Timing:  Constant Progression:  Worsening Chronicity:  Recurrent Context: activity   Relieved by:  Nothing Worsened by:  Nothing Ineffective treatments:  None tried Associated symptoms: chest pain   Associated symptoms: no abdominal pain, no cough, no headaches and no rash   Chest Pain Associated symptoms: shortness of breath   Associated symptoms: no abdominal pain, no back pain, no cough, no fatigue and no headache        Home Medications Prior to Admission medications   Medication Sig Start Date End Date Taking? Authorizing Provider  albuterol (PROVENTIL) (2.5 MG/3ML) 0.083% nebulizer solution Take 3 mLs (2.5 mg total) by nebulization every 6 (six) hours as needed for wheezing. Please place on filer for patient. 07/08/17   Tereasa Coop, PA-C  ALPRAZolam Duanne Moron) 2 MG tablet Take 1 tablet (2 mg total) by mouth at bedtime as needed for anxiety. Do not use this without using you CPAP. 10/26/17   Tereasa Coop, PA-C  amLODipine (NORVASC) 5 MG tablet Take 1 tablet (5 mg total) by mouth daily. 10/30/21   Barton Dubois, MD  budesonide-formoterol [facility]) 160-4.5 MCG/ACT inhaler Inhale 2 puffs into the lungs 2 (two) times daily. 07/08/17   Tereasa Coop, PA-C  ibuprofen (ADVIL) 600 MG tablet Take 600 mg by mouth every 6 (six) hours as needed for pain. 10/06/21   [provider]  ipratropium (ATROVENT) 0.02 % nebulizer solution Take 2.5 mLs  (0.5 mg total) by nebulization 4 (four) times daily. Patient not taking: Reported on 10/28/2021 07/08/17   Tereasa Coop, PA-C  montelukast (SINGULAIR) 10 MG tablet Take 1 tablet (10 mg total) by mouth at bedtime. Patient not taking: Reported on 10/28/2021 11/04/17   Martyn Ehrich, NP  OTEZLA 30 MG TABS Take 1 tablet by mouth 2 (two) times daily. 10/19/21   [provider]  pantoprazole (PROTONIX) 40 MG tablet Take 1 tablet (40 mg total) by mouth 2 (two) times daily. 10/30/21   Barton Dubois, MD  Peak Flow Meter (AIRZONE PEAK FLOW METER) DEVI Please use daily to keep track of your breathing. 10/10/15   Tereasa Coop, PA-C  predniSONE (DELTASONE) 20 MG tablet Take 3 tablets by mouth daily times a day; then 2 tablets by mouth daily x2 days; then 1 tablet by mouth daily x3 days; then half tablet by mouth daily x3 days and stop prednisone. 10/30/21   Barton Dubois, MD  promethazine (PHENERGAN) 12.5 MG tablet Take 12.5 mg by mouth as needed for nausea or vomiting. 09/02/17   [provider]  SUMAtriptan (IMITREX) 100 MG tablet TAKE 1/2 TO 1 (ONE-HALF TO ONE) TABLET BY MOUTH AS NEEDED FOR  MIGRAINE -- **MAXIMUM OF 200 MG IN 24 HOURS** 10/24/17   Tereasa Coop, PA-C      Allergies    Patient has no known allergies.    Review of Systems   Review of Systems  Constitutional:  Negative for appetite change and fatigue.  HENT:  Negative for congestion, ear discharge and sinus pressure.   Eyes:  Negative for discharge.  Respiratory:  Positive for shortness of breath. Negative for cough.   Cardiovascular:  Positive for chest pain.  Gastrointestinal:  Negative for abdominal pain and diarrhea.  Genitourinary:  Negative for frequency and hematuria.  Musculoskeletal:  Negative for back pain.  Skin:  Negative for rash.  Neurological:  Negative for seizures and headaches.  Psychiatric/Behavioral:  Negative for hallucinations.     Physical Exam Updated Vital Signs BP (!) 161/102   Pulse  (!) 103   Temp 98.1 F (36.7 C)   Resp (!) 23   Ht 6' (1.829 m)   Wt 113.4 kg   SpO2 95%   BMI 33.91 kg/m  Physical Exam Vitals and nursing note reviewed.  Constitutional:      Appearance: He is well-developed.  HENT:     Head: Normocephalic.     Mouth/Throat:     Mouth: Mucous membranes are moist.  Eyes:     General: No scleral icterus.    Conjunctiva/sclera: Conjunctivae normal.  Neck:     Thyroid: No thyromegaly.  Cardiovascular:     Rate and Rhythm: Normal rate and regular rhythm.     Heart sounds: No murmur heard.    No friction rub. No gallop.  Pulmonary:     Breath sounds: No stridor. Wheezing present. No rales.  Chest:     Chest wall: No tenderness.  Abdominal:     General: There is no distension.     Tenderness: There is no abdominal tenderness. There is no rebound.  Musculoskeletal:        General: Normal range of motion.     Cervical back: Neck supple.  Lymphadenopathy:     Cervical: No cervical adenopathy.  Skin:    Findings: No erythema or rash.  Neurological:     Mental Status: He is alert and oriented to person, place, and time.     Motor: No abnormal muscle tone.     Coordination: Coordination normal.  Psychiatric:        Behavior: Behavior normal.     ED Results / Procedures / Treatments   Labs (all labs ordered are listed, but only abnormal results are displayed) Labs Reviewed  CBC WITH DIFFERENTIAL/PLATELET - Abnormal; Notable for the following components:      Result Value   Eosinophils Absolute 1.1 (*)    All other components within normal limits  COMPREHENSIVE METABOLIC PANEL - Abnormal; Notable for the following components:   Glucose, Bld 121 (*)    Total Protein 8.3 (*)    All other components within normal limits  RESP PANEL BY RT-PCR (RSV, FLU A&B, COVID)  RVPGX2  BRAIN NATRIURETIC PEPTIDE  TROPONIN I (HIGH SENSITIVITY)  TROPONIN I (HIGH SENSITIVITY)    EKG None  Radiology DG Chest Port 1 View  Result Date:  06/18/2022 CLINICAL DATA:  Provided history: Shortness of breath. Congestion. Chest pain. EXAM: PORTABLE CHEST 1 VIEW COMPARISON:  Prior chest radiographs 10/28/2021 and earlier. Chest CT 10/28/2021. FINDINGS: Heart size within normal limits. No appreciable airspace consolidation. No evidence of pleural effusion or pneumothorax. No acute osseous abnormality identified. Degenerative changes of the spine. IMPRESSION: No evidence of acute cardiopulmonary abnormality. Electronically Signed   By: Kellie Simmering D.O.   On: 06/18/2022 08:12    Procedures Procedures    Medications Ordered in ED Medications  albuterol (VENTOLIN HFA) 108 (  90 Base) MCG/ACT inhaler 2 puff (has no administration in time range)  ipratropium-albuterol (DUONEB) 0.5-2.5 (3) MG/3ML nebulizer solution 3 mL (has no administration in time range)  albuterol (PROVENTIL) (2.5 MG/3ML) 0.083% nebulizer solution 2.5 mg (has no administration in time range)  ipratropium-albuterol (DUONEB) 0.5-2.5 (3) MG/3ML nebulizer solution 3 mL (3 mLs Nebulization Given 06/18/22 0742)  albuterol (PROVENTIL) (2.5 MG/3ML) 0.083% nebulizer solution 2.5 mg (2.5 mg Nebulization Given 06/18/22 0742)  methylPREDNISolone sodium succinate (SOLU-MEDROL) 125 mg/2 mL injection 125 mg (125 mg Intravenous Given 06/18/22 0758)  magnesium sulfate IVPB 2 g 50 mL (0 g Intravenous Stopped 06/18/22 0900)  ipratropium-albuterol (DUONEB) 0.5-2.5 (3) MG/3ML nebulizer solution 3 mL (3 mLs Nebulization Given 06/18/22 0905)  albuterol (PROVENTIL) (2.5 MG/3ML) 0.083% nebulizer solution 2.5 mg (2.5 mg Nebulization Given 06/18/22 0905)  ipratropium-albuterol (DUONEB) 0.5-2.5 (3) MG/3ML nebulizer solution 3 mL (3 mLs Nebulization Given 06/18/22 1202)  albuterol (PROVENTIL) (2.5 MG/3ML) 0.083% nebulizer solution 2.5 mg (2.5 mg Nebulization Given 06/18/22 1202)    ED Course/ Medical Decision Making/ A&P CRITICAL CARE Performed by: Milton Ferguson Total critical care time: 50 minutes Critical  care time was exclusive of separately billable procedures and treating other patients. Critical care was necessary to treat or prevent imminent or life-threatening deterioration. Critical care was time spent personally by me on the following activities: development of treatment plan with patient and/or surrogate as well as nursing, discussions with consultants, evaluation of patient's response to treatment, examination of patient, obtaining history from patient or surrogate, ordering and performing treatments and interventions, ordering and review of laboratory studies, ordering and review of radiographic studies, pulse oximetry and re-evaluation of patient's condition.    Patient received 3 different neb treatments and was still hypoxic afterwards.  He is admitted to medicine Click here for ABCD2, HEART and other calculatorsREFRESH Note before signing :1}                          Medical Decision Making Amount and/or Complexity of Data Reviewed Labs: ordered. Radiology: ordered. ECG/medicine tests: ordered.  Risk Prescription drug management. Decision regarding hospitalization.  This patient presents to the ED for concern of shortness of breath, this involves an extensive number of treatment options, and is a complaint that carries with it a high risk of complications and morbidity.  The differential diagnosis includes pneumonia, COPD   Co morbidities that complicate the patient evaluation  COPD   Additional history obtained:  Additional history obtained from family External records from outside source obtained and reviewed including hospital records   Lab Tests:  I Ordered, and personally interpreted labs.  The pertinent results include: CBC chemistries unremarkable   Imaging Studies ordered:  I ordered imaging studies including chest x-ray I independently visualized and interpreted imaging which showed no acute disease I agree with the radiologist  interpretation   Cardiac Monitoring: / EKG:  The patient was maintained on a cardiac monitor.  I personally viewed and interpreted the cardiac monitored which showed an underlying rhythm of: Sinus tach   Consultations Obtained:  I requested consultation with the internal medicine,  and discussed lab and imaging findings as well as pertinent plan - they recommend: Admit   Problem List / ED Course / Critical interventions / Medication management  COPD and shortness of breath with hypoxia I ordered medication including albuterol and Atrovent Reevaluation of the patient after these medicines showed that the patient improved I have reviewed the patients home medicines and  have made adjustments as needed   Social Determinants of Health:  None   Test / Admission - Considered:  None  Patient will be admitted for COPD exacerbation        Final Clinical Impression(s) / ED Diagnoses Final diagnoses:  None    Rx / DC Orders ED Discharge Orders     None         Milton Ferguson, MD 06/18/22 1815

## 2022-06-18 NOTE — ED Notes (Signed)
Pt 86% RA upon assessment and nasal cannula applied at 4 lpm to raise above 90%. RT called and assessed pt. Pt on bipap. EDP at bedside during triage.

## 2022-06-18 NOTE — ED Notes (Signed)
RT notified of sob, RT to bedside

## 2022-06-18 NOTE — Progress Notes (Signed)
RT took patient off of BIPAP and placed on 3L nasal cannula. Patient is tolerating well at this time and states he feels like his breathing is much better despite the wheeze. BIPAP remains at bedside if and when needed.

## 2022-06-18 NOTE — Progress Notes (Signed)
Patient dyspnea worse with talking and exertion. BP elevated but has improved since arrival to ED. HR is elevated due to increase WOB and SOB. Patient is not in distress and denies CP at this time. MD notified of hypertension, evaluated patient and reviewed chart, and instructs assigned nurse to monitor BP and contact if worse or in need of PRN. Will continue to monitor.   06/18/22 1947  Assess: MEWS Score  Temp 98.5 F (36.9 C)  BP (!) 173/122  MAP (mmHg) 133  Pulse Rate (!) 110  ECG Heart Rate (!) 108  Resp (!) 23  Level of Consciousness Alert  SpO2 95 %  O2 Device Nasal Cannula  O2 Flow Rate (L/min) 4 L/min  Assess: MEWS Score  MEWS Temp 0  MEWS Systolic 0  MEWS Pulse 1  MEWS RR 1  MEWS LOC 0  MEWS Score 2  MEWS Score Color Yellow  Assess: if the MEWS score is Yellow or Red  Were vital signs taken at a resting state? Yes  Focused Assessment No change from prior assessment  Does the patient meet 2 or more of the SIRS criteria? No  Does the patient have a confirmed or suspected source of infection? No  Provider and Rapid Response Notified? No  MEWS guidelines implemented  No, previously yellow, continue vital signs every 4 hours  Notify: Charge Nurse/RN  Name of Charge Nurse/RN Notified Programme researcher, broadcasting/film/video  Assess: SIRS CRITERIA  SIRS Temperature  0  SIRS Pulse 1  SIRS Respirations  1  SIRS WBC 0  SIRS Score Sum  2

## 2022-06-18 NOTE — ED Notes (Signed)
RT to assess pt and take off Bi-pap. Third floor can not take a pt on Bi-pap

## 2022-06-18 NOTE — ED Notes (Signed)
Pt reports that he attempted to ambulate on RA in ED and became very sob.  Pt is back in room and now on 4L [place] (was on 2L [place] previously), pt is reporting sob, admitting MD notified

## 2022-06-18 NOTE — ED Triage Notes (Signed)
Pt arrived POV with SOB, congestion, cough, x 4 days  and chest pain started this morning.

## 2022-06-18 NOTE — Progress Notes (Signed)
MD on call notified of patient's hypertension NO distress or complaints of CP. Awaiting response/orders at this time.

## 2022-06-18 NOTE — H&P (Signed)
History and Physical    BRADON FESTER OEV:[id number] DOB: 1965/08/22 DOA: 06/18/2022  PCP: Tereasa Coop, PA-C   Patient coming from: Home  Chief Complaint: Shortness of breath/wheezing  HPI: Derek Pacheco is a 57 y.o. male with medical history significant for COPD, hypertension, OSA on CPAP, GERD, migraine, and obesity who presented to the ED with worsening shortness of breath and wheezing.  He has apparently been having symptoms for the last 4 days with some associated cough and congestion along with some chest pain that started this morning.  He states that he has had some cough with sputum production that was especially worse with laying flat.   ED Course: Vital signs stable and patient is afebrile.  Initially was placed on BiPAP in the ED and then transition to 4 L nasal cannula oxygen.  Given multiple breathing treatments in the ED as well as IV Solu-Medrol and chest x-ray with no acute findings noted.  Despite all this, he continues to have some ongoing symptoms and has hypoxemia with ambulation.  Review of Systems: Reviewed as noted above, otherwise negative.  Past Medical History:  Diagnosis Date   Allergy    Asthma    Bronchitis    COPD (chronic obstructive pulmonary disease) (HCC)    GERD (gastroesophageal reflux disease)    Insomnia    Migraines    Sleep apnea     Past Surgical History:  Procedure Laterality Date   KNEE SURGERY Right    2017     reports that he quit smoking about 16 years ago. His smoking use included cigarettes. He has a 25.00 pack-year smoking history. He has never used smokeless tobacco. He reports current alcohol use. He reports that he does not use drugs.  No Known Allergies  Family History  Problem Relation Age of Onset   Asthma Mother    Colon cancer Neg Hx    Colon polyps Neg Hx    Esophageal cancer Neg Hx    Rectal cancer Neg Hx    Stomach cancer Neg Hx     Prior to Admission medications   Medication Sig Start Date End  Date Taking? Authorizing Provider  albuterol (PROVENTIL) (2.5 MG/3ML) 0.083% nebulizer solution Take 3 mLs (2.5 mg total) by nebulization every 6 (six) hours as needed for wheezing. Please place on filer for patient. 07/08/17   Tereasa Coop, PA-C  ALPRAZolam Duanne Moron) 2 MG tablet Take 1 tablet (2 mg total) by mouth at bedtime as needed for anxiety. Do not use this without using you CPAP. 10/26/17   Tereasa Coop, PA-C  amLODipine (NORVASC) 5 MG tablet Take 1 tablet (5 mg total) by mouth daily. 10/30/21   Barton Dubois, MD  budesonide-formoterol [facility]) 160-4.5 MCG/ACT inhaler Inhale 2 puffs into the lungs 2 (two) times daily. 07/08/17   Tereasa Coop, PA-C  ibuprofen (ADVIL) 600 MG tablet Take 600 mg by mouth every 6 (six) hours as needed for pain. 10/06/21   [provider]  ipratropium (ATROVENT) 0.02 % nebulizer solution Take 2.5 mLs (0.5 mg total) by nebulization 4 (four) times daily. Patient not taking: Reported on 10/28/2021 07/08/17   Tereasa Coop, PA-C  montelukast (SINGULAIR) 10 MG tablet Take 1 tablet (10 mg total) by mouth at bedtime. Patient not taking: Reported on 10/28/2021 11/04/17   Martyn Ehrich, NP  OTEZLA 30 MG TABS Take 1 tablet by mouth 2 (two) times daily. 10/19/21   [provider]  pantoprazole (PROTONIX) 40 MG  tablet Take 1 tablet (40 mg total) by mouth 2 (two) times daily. 10/30/21   Barton Dubois, MD  Peak Flow Meter (AIRZONE PEAK FLOW METER) DEVI Please use daily to keep track of your breathing. 10/10/15   Tereasa Coop, PA-C  predniSONE (DELTASONE) 20 MG tablet Take 3 tablets by mouth daily times a day; then 2 tablets by mouth daily x2 days; then 1 tablet by mouth daily x3 days; then half tablet by mouth daily x3 days and stop prednisone. 10/30/21   Barton Dubois, MD  promethazine (PHENERGAN) 12.5 MG tablet Take 12.5 mg by mouth as needed for nausea or vomiting. 09/02/17   [provider]  SUMAtriptan (IMITREX) 100 MG tablet TAKE 1/2 TO 1  (ONE-HALF TO ONE) TABLET BY MOUTH AS NEEDED FOR  MIGRAINE -- **MAXIMUM OF 200 MG IN 24 HOURS** 10/24/17   Tereasa Coop, PA-C    Physical Exam: Vitals:   06/18/22 1200 06/18/22 1230 06/18/22 1300 06/18/22 1330  BP: (!) 163/108 (!) 146/101 (!) 154/92 (!) 161/102  Pulse: 98 (!) 103 (!) 102 (!) 103  Resp: (!) 22 (!) 23 (!) 23 (!) 23  Temp:      TempSrc:      SpO2: 97% 94% 98% 95%  Weight:      Height:        Constitutional: NAD, calm, comfortable Vitals:   06/18/22 1200 06/18/22 1230 06/18/22 1300 06/18/22 1330  BP: (!) 163/108 (!) 146/101 (!) 154/92 (!) 161/102  Pulse: 98 (!) 103 (!) 102 (!) 103  Resp: (!) 22 (!) 23 (!) 23 (!) 23  Temp:      TempSrc:      SpO2: 97% 94% 98% 95%  Weight:      Height:       Eyes: lids and conjunctivae normal Neck: normal, supple Respiratory: Coarse breath sounds with wheezing bilaterally.  2 L nasal cannula. Cardiovascular: Regular rate and rhythm, no murmurs. Abdomen: no tenderness, no distention. Bowel sounds positive.  Musculoskeletal:  No edema. Skin: no rashes, lesions, ulcers.  Psychiatric: Flat affect  Labs on Admission: I have personally reviewed following labs and imaging studies  CBC: Recent Labs  Lab 06/18/22 0758  WBC 7.1  NEUTROABS 4.3  HGB 14.9  HCT 46.3  MCV 82.2  PLT 0000000   Basic Metabolic Panel: Recent Labs  Lab 06/18/22 0758  NA 138  K 3.9  CL 103  CO2 26  GLUCOSE 121*  BUN 14  CREATININE 0.91  CALCIUM 9.1   GFR: Estimated Creatinine Clearance: 115 mL/min (by C-G formula based on SCr of 0.91 mg/dL). Liver Function Tests: Recent Labs  Lab 06/18/22 0758  AST 26  ALT 33  ALKPHOS 115  BILITOT 0.6  PROT 8.3*  ALBUMIN 4.7   No results for input(s): "LIPASE", "AMYLASE" in the last 168 hours. No results for input(s): "AMMONIA" in the last 168 hours. Coagulation Profile: No results for input(s): "INR", "PROTIME" in the last 168 hours. Cardiac Enzymes: No results for input(s): "CKTOTAL", "CKMB",  "CKMBINDEX", "TROPONINI" in the last 168 hours. BNP (last 3 results) No results for input(s): "PROBNP" in the last 8760 hours. HbA1C: No results for input(s): "HGBA1C" in the last 72 hours. CBG: No results for input(s): "GLUCAP" in the last 168 hours. Lipid Profile: No results for input(s): "CHOL", "HDL", "LDLCALC", "TRIG", "CHOLHDL", "LDLDIRECT" in the last 72 hours. Thyroid Function Tests: No results for input(s): "TSH", "T4TOTAL", "FREET4", "T3FREE", "THYROIDAB" in the last 72 hours. Anemia Panel: No results for  input(s): "VITAMINB12", "FOLATE", "FERRITIN", "TIBC", "IRON", "RETICCTPCT" in the last 72 hours. Urine analysis:    Component Value Date/Time   BILIRUBINUR negative 09/09/2016 1828   KETONESUR negative 09/09/2016 1828   PROTEINUR negative 09/09/2016 1828   UROBILINOGEN 0.2 09/09/2016 1828   NITRITE Negative 09/09/2016 1828   LEUKOCYTESUR Negative 09/09/2016 1828    Radiological Exams on Admission: DG Chest Port 1 View  Result Date: 06/18/2022 CLINICAL DATA:  Provided history: Shortness of breath. Congestion. Chest pain. EXAM: PORTABLE CHEST 1 VIEW COMPARISON:  Prior chest radiographs 10/28/2021 and earlier. Chest CT 10/28/2021. FINDINGS: Heart size within normal limits. No appreciable airspace consolidation. No evidence of pleural effusion or pneumothorax. No acute osseous abnormality identified. Degenerative changes of the spine. IMPRESSION: No evidence of acute cardiopulmonary abnormality. Electronically Signed   By: Kellie Simmering D.O.   On: 06/18/2022 08:12    EKG: Independently reviewed.  Sinus tachycardia 102 bpm  Assessment/Plan Principal Problem:   COPD with acute exacerbation (HCC) Active Problems:   Stopped smoking with greater than 20 pack year history   Acute respiratory failure with hypoxia (HCC)   Essential hypertension   GERD (gastroesophageal reflux disease)   OSA on CPAP   Obesity, Class I, BMI 30-34.9    Acute hypoxemic respiratory failure in the  setting of acute COPD exacerbation -Scheduled DuoNebs -IV Solu-Medrol twice daily -Pulmicort twice daily -Chest x-ray with no significant findings concerning for pneumonia  OSA on CPAP -Continue CPAP at bedtime  GERD -Continue PPI  Hypertension -Continue home antihypertensive regimen  Obesity -BMI 33.91   DVT prophylaxis: Lovenox Code Status: Full Family Communication: None at bedside Disposition Plan: Admit for COPD exacerbation Consults called: None Admission status: Observation, telemetry  Severity of Illness: The appropriate patient status for this patient is OBSERVATION. Observation status is judged to be reasonable and necessary in order to provide the required intensity of service to ensure the patient's safety. The patient's presenting symptoms, physical exam findings, and initial radiographic and laboratory data in the context of their medical condition is felt to place them at decreased risk for further clinical deterioration. Furthermore, it is anticipated that the patient will be medically stable for discharge from the hospital within 2 midnights of admission.    Pearley Millington D Manuella Ghazi DO [facility]  If 7PM-7AM, please contact night-coverage [email or web address]  06/18/2022, 2:22 PM

## 2022-06-18 NOTE — ED Notes (Signed)
RT verbalized pt is off bipap.

## 2022-06-19 DIAGNOSIS — E669 Obesity, unspecified: Secondary | ICD-10-CM | POA: Diagnosis not present

## 2022-06-19 DIAGNOSIS — Z6833 Body mass index (BMI) 33.0-33.9, adult: Secondary | ICD-10-CM | POA: Diagnosis not present

## 2022-06-19 DIAGNOSIS — Z7951 Long term (current) use of inhaled steroids: Secondary | ICD-10-CM | POA: Diagnosis not present

## 2022-06-19 DIAGNOSIS — Z79899 Other long term (current) drug therapy: Secondary | ICD-10-CM | POA: Diagnosis not present

## 2022-06-19 DIAGNOSIS — K219 Gastro-esophageal reflux disease without esophagitis: Secondary | ICD-10-CM | POA: Diagnosis not present

## 2022-06-19 DIAGNOSIS — Z87891 Personal history of nicotine dependence: Secondary | ICD-10-CM | POA: Diagnosis not present

## 2022-06-19 DIAGNOSIS — I10 Essential (primary) hypertension: Secondary | ICD-10-CM | POA: Diagnosis not present

## 2022-06-19 DIAGNOSIS — J441 Chronic obstructive pulmonary disease with (acute) exacerbation: Secondary | ICD-10-CM | POA: Diagnosis not present

## 2022-06-19 DIAGNOSIS — Z825 Family history of asthma and other chronic lower respiratory diseases: Secondary | ICD-10-CM | POA: Diagnosis not present

## 2022-06-19 DIAGNOSIS — G4733 Obstructive sleep apnea (adult) (pediatric): Secondary | ICD-10-CM | POA: Diagnosis not present

## 2022-06-19 DIAGNOSIS — J9601 Acute respiratory failure with hypoxia: Secondary | ICD-10-CM | POA: Diagnosis not present

## 2022-06-19 DIAGNOSIS — Z1152 Encounter for screening for COVID-19: Secondary | ICD-10-CM | POA: Diagnosis not present

## 2022-06-19 DIAGNOSIS — R0602 Shortness of breath: Secondary | ICD-10-CM | POA: Diagnosis not present

## 2022-06-19 LAB — CBC
HCT: 43.3 % (ref 39.0–52.0)
Hemoglobin: 13.9 g/dL (ref 13.0–17.0)
MCH: 26.5 pg (ref 26.0–34.0)
MCHC: 32.1 g/dL (ref 30.0–36.0)
MCV: 82.5 fL (ref 80.0–100.0)
Platelets: 263 10*3/uL (ref 150–400)
RBC: 5.25 MIL/uL (ref 4.22–5.81)
RDW: 15.5 % (ref 11.5–15.5)
WBC: 12 10*3/uL — ABNORMAL HIGH (ref 4.0–10.5)
nRBC: 0 % (ref 0.0–0.2)

## 2022-06-19 LAB — MAGNESIUM: Magnesium: 2.1 mg/dL (ref 1.7–2.4)

## 2022-06-19 LAB — BASIC METABOLIC PANEL
Anion gap: 10 (ref 5–15)
BUN: 19 mg/dL (ref 6–20)
CO2: 24 mmol/L (ref 22–32)
Calcium: 9.2 mg/dL (ref 8.9–10.3)
Chloride: 103 mmol/L (ref 98–111)
Creatinine, Ser: 0.95 mg/dL (ref 0.61–1.24)
GFR, Estimated: >60 mL/min (ref >60)
Glucose, Bld: 139 mg/dL — ABNORMAL HIGH (ref 70–99)
Potassium: 3.9 mmol/L (ref 3.5–5.1)
Sodium: 137 mmol/L (ref 135–145)

## 2022-06-19 MED ORDER — SODIUM CHLORIDE 0.9 % IV SOLN
100.0000 mg | Freq: Two times a day (BID) | INTRAVENOUS | Status: DC
  Administered 2022-06-19 – 2022-06-20 (×3): 100 mg via INTRAVENOUS
  Filled 2022-06-19 (×5): qty 100

## 2022-06-19 MED ORDER — METHYLPREDNISOLONE SODIUM SUCC 125 MG IJ SOLR
60.0000 mg | Freq: Two times a day (BID) | INTRAMUSCULAR | Status: DC
  Administered 2022-06-19 – 2022-06-20 (×2): 60 mg via INTRAVENOUS
  Filled 2022-06-19 (×2): qty 2

## 2022-06-19 MED ORDER — DM-GUAIFENESIN ER 30-600 MG PO TB12
1.0000 | ORAL_TABLET | Freq: Two times a day (BID) | ORAL | Status: DC
  Administered 2022-06-19 – 2022-06-20 (×3): 1 via ORAL
  Filled 2022-06-19 (×3): qty 1

## 2022-06-19 NOTE — Progress Notes (Signed)
PROGRESS NOTE    Derek Pacheco  [id number] DOB: 1966-01-25 DOA: 06/18/2022 PCP: Tereasa Coop, PA-C   Brief Narrative:    Derek Pacheco is a 57 y.o. male with medical history significant for COPD, hypertension, OSA on CPAP, GERD, migraine, and obesity who presented to the ED with worsening shortness of breath and wheezing.  He has been admitted with acute hypoxemic respiratory failure in the setting of acute COPD exacerbation.  Assessment & Plan:   Principal Problem:   COPD with acute exacerbation ([place]) Active Problems:   Stopped smoking with greater than 20 pack year history   Acute respiratory failure with hypoxia (HCC)   Essential hypertension   GERD (gastroesophageal reflux disease)   OSA on CPAP   Obesity, Class I, BMI 30-34.9  Assessment and Plan:   Acute hypoxemic respiratory failure in the setting of acute COPD exacerbation -Scheduled DuoNebs -IV Solu-Medrol twice daily -Pulmicort twice daily -Plan to start IV doxycycline 3/23 -Chest x-ray with no significant findings concerning for pneumonia   OSA on CPAP -Continue CPAP at bedtime   GERD -Continue PPI   Hypertension -Continue home antihypertensive regimen   Obesity -BMI 33.91    DVT prophylaxis: Lovenox Code Status: Full Family Communication: None at bedside Disposition Plan:  Status is: Observation The patient will require care spanning > 2 midnights and should be moved to inpatient because: Need for IV medications.  Consultants:  None  Procedures:  None  Antimicrobials:  Doxycycline 3/23>   Subjective: Patient seen and evaluated today with worsening shortness of breath with congestion and wheezing.  Objective: Vitals:   06/19/22 0145 06/19/22 0445 06/19/22 0746 06/19/22 0939  BP: (!) 157/112 (!) 137/95  (!) 136/100  Pulse: (!) 102 99  98  Resp: 19 18  19   Temp: 97.7 F (36.5 C) 98 F (36.7 C)    TempSrc: Oral Axillary    SpO2: 97% 96% 97% 96%  Weight:      Height:         Intake/Output Summary (Last 24 hours) at 06/19/2022 0956 Last data filed at 06/18/2022 2300 Gross per 24 hour  Intake 480 ml  Output --  Net 480 ml   Filed Weights   06/18/22 0726  Weight: 113.4 kg    Examination:  General exam: Appears calm and comfortable  Respiratory system: Clear to auscultation. Respiratory effort normal.  4-5 L nasal cannula Cardiovascular system: S1 & S2 heard, RRR.  Gastrointestinal system: Abdomen is soft Central nervous system: Alert and awake Extremities: No edema Skin: No significant lesions noted Psychiatry: Flat affect.    Data Reviewed: I have personally reviewed following labs and imaging studies  CBC: Recent Labs  Lab 06/18/22 0758 06/19/22 0509  WBC 7.1 12.0*  NEUTROABS 4.3  --   HGB 14.9 13.9  HCT 46.3 43.3  MCV 82.2 82.5  PLT 246 [id number]   Basic Metabolic Panel: Recent Labs  Lab 06/18/22 0758 06/19/22 0509  NA 138 137  K 3.9 3.9  CL 103 103  CO2 26 24  GLUCOSE 121* 139*  BUN 14 19  CREATININE 0.91 0.95  CALCIUM 9.1 9.2  MG  --  2.1   GFR: Estimated Creatinine Clearance: 110.2 mL/min (by C-G formula based on SCr of 0.95 mg/dL). Liver Function Tests: Recent Labs  Lab 06/18/22 0758  AST 26  ALT 33  ALKPHOS 115  BILITOT 0.6  PROT 8.3*  ALBUMIN 4.7   No results for input(s): "LIPASE", "AMYLASE" in the last 168  hours. No results for input(s): "AMMONIA" in the last 168 hours. Coagulation Profile: No results for input(s): "INR", "PROTIME" in the last 168 hours. Cardiac Enzymes: No results for input(s): "CKTOTAL", "CKMB", "CKMBINDEX", "TROPONINI" in the last 168 hours. BNP (last 3 results) No results for input(s): "PROBNP" in the last 8760 hours. HbA1C: No results for input(s): "HGBA1C" in the last 72 hours. CBG: No results for input(s): "GLUCAP" in the last 168 hours. Lipid Profile: No results for input(s): "CHOL", "HDL", "LDLCALC", "TRIG", "CHOLHDL", "LDLDIRECT" in the last 72 hours. Thyroid Function  Tests: No results for input(s): "TSH", "T4TOTAL", "FREET4", "T3FREE", "THYROIDAB" in the last 72 hours. Anemia Panel: No results for input(s): "VITAMINB12", "FOLATE", "FERRITIN", "TIBC", "IRON", "RETICCTPCT" in the last 72 hours. Sepsis Labs: No results for input(s): "PROCALCITON", "LATICACIDVEN" in the last 168 hours.  Recent Results (from the past 240 hour(s))  Resp panel by RT-PCR (RSV, Flu A&B, Covid) Anterior Nasal Swab     Status: None   Collection Time: 06/18/22 12:05 PM   Specimen: Anterior Nasal Swab  Result Value Ref Range Status   SARS Coronavirus 2 by RT PCR NEGATIVE NEGATIVE Final    Comment: (NOTE) SARS-CoV-2 target nucleic acids are NOT DETECTED.  The SARS-CoV-2 RNA is generally detectable in upper respiratory specimens during the acute phase of infection. The lowest concentration of SARS-CoV-2 viral copies this assay can detect is 138 copies/mL. A negative result does not preclude SARS-Cov-2 infection and should not be used as the sole basis for treatment or other patient management decisions. A negative result may occur with  improper specimen collection/handling, submission of specimen other than nasopharyngeal swab, presence of viral mutation(s) within the areas targeted by this assay, and inadequate number of viral copies(<138 copies/mL). A negative result must be combined with clinical observations, patient history, and epidemiological information. The expected result is Negative.  Fact Sheet for Patients:  EntrepreneurPulse.com.au  Fact Sheet for Healthcare Providers:  IncredibleEmployment.be  This test is no t yet approved or cleared by the [place] FDA and  has been authorized for detection and/or diagnosis of SARS-CoV-2 by FDA under an Emergency Use Authorization (EUA). This EUA will remain  in effect (meaning this test can be used) for the duration of the COVID-19 declaration under Section 564(b)(1) of the Act,  21 U.S.C.section 360bbb-3(b)(1), unless the authorization is terminated  or revoked sooner.       Influenza A by PCR NEGATIVE NEGATIVE Final   Influenza B by PCR NEGATIVE NEGATIVE Final    Comment: (NOTE) The Xpert Xpress SARS-CoV-2/FLU/RSV plus assay is intended as an aid in the diagnosis of influenza from Nasopharyngeal swab specimens and should not be used as a sole basis for treatment. Nasal washings and aspirates are unacceptable for Xpert Xpress SARS-CoV-2/FLU/RSV testing.  Fact Sheet for Patients: EntrepreneurPulse.com.au  Fact Sheet for Healthcare Providers: IncredibleEmployment.be  This test is not yet approved or cleared by the [place] FDA and has been authorized for detection and/or diagnosis of SARS-CoV-2 by FDA under an Emergency Use Authorization (EUA). This EUA will remain in effect (meaning this test can be used) for the duration of the COVID-19 declaration under Section 564(b)(1) of the Act, 21 U.S.C. section 360bbb-3(b)(1), unless the authorization is terminated or revoked.     Resp Syncytial Virus by PCR NEGATIVE NEGATIVE Final    Comment: (NOTE) Fact Sheet for Patients: EntrepreneurPulse.com.au  Fact Sheet for Healthcare Providers: IncredibleEmployment.be  This test is not yet approved or cleared by the [place] FDA and has  been authorized for detection and/or diagnosis of SARS-CoV-2 by FDA under an Emergency Use Authorization (EUA). This EUA will remain in effect (meaning this test can be used) for the duration of the COVID-19 declaration under Section 564(b)(1) of the Act, 21 U.S.C. section 360bbb-3(b)(1), unless the authorization is terminated or revoked.  Performed at [facility], [address] [place], [place] [address]          Radiology Studies: [facility] Chest [facility] 1 View  Result Date: 06/18/2022 CLINICAL DATA:  Provided history: Shortness of breath.  Congestion. Chest pain. EXAM: PORTABLE CHEST 1 VIEW COMPARISON:  Prior chest radiographs 10/28/2021 and earlier. Chest CT 10/28/2021. FINDINGS: Heart size within normal limits. No appreciable airspace consolidation. No evidence of pleural effusion or pneumothorax. No acute osseous abnormality identified. Degenerative changes of the spine. IMPRESSION: No evidence of acute cardiopulmonary abnormality. Electronically Signed   By: Kellie Simmering D.O.   On: 06/18/2022 08:12        Scheduled Meds:  amLODipine  5 mg Oral Daily   budesonide (PULMICORT) nebulizer solution  0.25 mg Nebulization BID   dextromethorphan-guaiFENesin  1 tablet Oral BID   enoxaparin (LOVENOX) injection  40 mg Subcutaneous Q24H   ipratropium-albuterol  3 mL Nebulization Q6H   methylPREDNISolone (SOLU-MEDROL) injection  60 mg Intravenous Q12H   montelukast  10 mg Oral QHS   pantoprazole  40 mg Oral BID     LOS: 0 days    Time spent: 35 minutes    Deyja Sochacki Darleen Crocker, DO [facility]  If 7PM-7AM, please contact night-coverage [email or web address] 06/19/2022, 9:56 AM

## 2022-06-19 NOTE — TOC Progression Note (Signed)
  Transition of Care [facility]) Screening Note   Patient Details  Name: Derek Pacheco Date of Birth: Jun 19, 1965   Transition of Care [facility]) CM/SW Contact:    Boneta Lucks, RN Phone Number: 06/19/2022, 3:27 PM    Transition of Care Department [facility]) has reviewed patient and no TOC needs have been identified at this time. We will continue to monitor patient advancement through interdisciplinary progression rounds. If new patient transition needs arise, please place a TOC consult.    Expected Discharge Plan: Home/Self Care Barriers to Discharge: Continued Medical Work up  Expected Discharge Plan and Services       Living arrangements for the past 2 months: Single Family Home                      Social Determinants of Health (SDOH) Interventions SDOH Screenings   Food Insecurity: No Food Insecurity (06/18/2022)  Housing: Low Risk  (06/18/2022)  Transportation Needs: No Transportation Needs (06/18/2022)  Utilities: Not At Risk (06/18/2022)  Depression (PHQ2-9): Low Risk  (04/05/2018)  Tobacco Use: Medium Risk (06/18/2022)    Readmission Risk Interventions    10/29/2021   11:27 AM  Readmission Risk Prevention Plan  Post Dischage Appt Not Complete  Medication Screening Complete  Transportation Screening Complete

## 2022-06-20 DIAGNOSIS — J441 Chronic obstructive pulmonary disease with (acute) exacerbation: Secondary | ICD-10-CM | POA: Diagnosis not present

## 2022-06-20 LAB — CBC
HCT: 42.3 % (ref 39.0–52.0)
Hemoglobin: 13.4 g/dL (ref 13.0–17.0)
MCH: 26.4 pg (ref 26.0–34.0)
MCHC: 31.7 g/dL (ref 30.0–36.0)
MCV: 83.3 fL (ref 80.0–100.0)
Platelets: 249 10*3/uL (ref 150–400)
RBC: 5.08 MIL/uL (ref 4.22–5.81)
RDW: 15.2 % (ref 11.5–15.5)
WBC: 11 10*3/uL — ABNORMAL HIGH (ref 4.0–10.5)
nRBC: 0 % (ref 0.0–0.2)

## 2022-06-20 LAB — BASIC METABOLIC PANEL
Anion gap: 10 (ref 5–15)
BUN: 26 mg/dL — ABNORMAL HIGH (ref 6–20)
CO2: 24 mmol/L (ref 22–32)
Calcium: 9 mg/dL (ref 8.9–10.3)
Chloride: 104 mmol/L (ref 98–111)
Creatinine, Ser: 0.95 mg/dL (ref 0.61–1.24)
GFR, Estimated: >60 mL/min (ref >60)
Glucose, Bld: 142 mg/dL — ABNORMAL HIGH (ref 70–99)
Potassium: 4 mmol/L (ref 3.5–5.1)
Sodium: 138 mmol/L (ref 135–145)

## 2022-06-20 LAB — MAGNESIUM: Magnesium: 2 mg/dL (ref 1.7–2.4)

## 2022-06-20 MED ORDER — PREDNISONE 10 MG PO TABS: 40.0000 mg | ORAL_TABLET | Freq: Every day | ORAL | 0 refills | Status: AC

## 2022-06-20 MED ORDER — IPRATROPIUM-ALBUTEROL 0.5-2.5 (3) MG/3ML IN SOLN
3.0000 mL | Freq: Three times a day (TID) | RESPIRATORY_TRACT | Status: DC
  Administered 2022-06-20: 3 mL via RESPIRATORY_TRACT
  Filled 2022-06-20: qty 3

## 2022-06-20 MED ORDER — DM-GUAIFENESIN ER 30-600 MG PO TB12: 1.0000 | ORAL_TABLET | Freq: Two times a day (BID) | ORAL | 0 refills | Status: AC

## 2022-06-20 NOTE — Progress Notes (Signed)
Discharge instructions provided to patient. All medications, follow up appointments, and discharge instructions provided. IV out. Monitor off CCMD notified. Discharging to home.  °Navi Erber R Jaspreet Bodner, RN  °

## 2022-06-20 NOTE — Progress Notes (Signed)
Patient has declined to use CPAP this evening due to increased hoarseness in his voice and wheezing after wearing the unlit  the night before. RT gave patient a HHN and placed him on O2 for bedtime. CPAP unit is at the bedside if patient needs it.

## 2022-06-20 NOTE — Progress Notes (Signed)
  Patient Saturations on Room Air at Rest = 94%  Patient Saturations on [employer] while Ambulating = 91%

## 2022-06-20 NOTE — Discharge Summary (Signed)
Physician Discharge Summary  Derek Pacheco [id number] DOB: [birth year] DOA: 06/18/2022  PCP: Tereasa Coop, PA-C  Admit date: 06/18/2022  Discharge date: 06/20/2022  Admitted From:Home  Disposition:  Home  Recommendations for Outpatient Follow-up:  Follow up with PCP in 1-2 weeks Continue home breathing treatments as needed for shortness of breath or wheezing Continue prednisone as prescribed and complete 5-day course Continue other home medications as prior  Home Health: None  Equipment/Devices: None  Discharge Condition:Stable  CODE STATUS: Full  Diet recommendation: Heart Healthy  Brief/Interim Summary: Derek Pacheco is a 57 y.o. male with medical history significant for COPD, hypertension, OSA on CPAP, GERD, migraine, and obesity who presented to the ED with worsening shortness of breath and wheezing.  He has been admitted with acute hypoxemic respiratory failure in the setting of acute COPD exacerbation.  He no longer has any oxygen requirement and is in stable condition for discharge.  No other acute events or concerns noted during this brief hospitalization.  Discharge Diagnoses:  Principal Problem:   COPD with acute exacerbation ([place]) Active Problems:   Stopped smoking with greater than 20 pack year history   Acute respiratory failure with hypoxia ([facility])   Essential hypertension   GERD (gastroesophageal reflux disease)   OSA on CPAP   Obesity, Class I, BMI 30-34.9  Principal discharge diagnosis: Acute hypoxemic respiratory failure secondary to acute COPD exacerbation.  Discharge Instructions  Discharge Instructions     Diet - low sodium heart healthy   Complete by: As directed    Increase activity slowly   Complete by: As directed       Allergies as of 06/20/2022   No Known Allergies      Medication List     TAKE these medications    albuterol (2.5 MG/3ML) 0.083% nebulizer solution Commonly known as: PROVENTIL Take 3 mLs (2.5 mg total)  by nebulization every 6 (six) hours as needed for wheezing. Please place on filer for patient.   alprazolam 2 MG tablet Commonly known as: XANAX Take 1 tablet (2 mg total) by mouth at bedtime as needed for anxiety. Do not use this without using you CPAP.   amLODipine 5 MG tablet Commonly known as: NORVASC Take 1 tablet (5 mg total) by mouth daily.   atorvastatin 10 MG tablet Commonly known as: LIPITOR Take 10 mg by mouth daily.   budesonide-formoterol 160-4.5 MCG/ACT inhaler Commonly known as: SYMBICORT Inhale 2 puffs into the lungs 2 (two) times daily.   clobetasol 0.05 % external solution Commonly known as: TEMOVATE Apply topically 2 (two) times daily as needed.   dextromethorphan-guaiFENesin 30-600 MG 12hr tablet Commonly known as: MUCINEX DM Take 1 tablet by mouth 2 (two) times daily for 10 days.   halobetasol 0.05 % cream Commonly known as: ULTRAVATE Apply topically 2 (two) times daily as needed.   ibuprofen 600 MG tablet Commonly known as: ADVIL Take 600 mg by mouth every 6 (six) hours as needed for pain.   montelukast 10 MG tablet Commonly known as: SINGULAIR Take 1 tablet (10 mg total) by mouth at bedtime.   Otezla 30 MG Tabs Generic drug: Apremilast Take 1 tablet by mouth 2 (two) times daily.   pantoprazole 40 MG tablet Commonly known as: PROTONIX Take 1 tablet (40 mg total) by mouth 2 (two) times daily.   Peak Flow Meter Devi Commonly known as: Airzone Peak Flow Meter Please use daily to keep track of your breathing.   predniSONE 10 MG tablet Commonly  known as: DELTASONE Take 4 tablets (40 mg total) by mouth daily for 5 days. What changed:  medication strength how much to take how to take this when to take this additional instructions   promethazine 12.5 MG tablet Commonly known as: PHENERGAN Take 12.5 mg by mouth as needed for nausea or vomiting.   sildenafil 20 MG tablet Commonly known as: REVATIO Take 1-4 tablets by mouth as needed  (irrectile disfunction).   SUMAtriptan 100 MG tablet Commonly known as: IMITREX TAKE 1/2 TO 1 (ONE-HALF TO ONE) TABLET BY MOUTH AS NEEDED FOR  MIGRAINE -- **MAXIMUM OF 200 MG IN 24 HOURS**        Follow-up Information     Tereasa Coop, PA-C. Schedule an appointment as soon as possible for a visit in 1 week(s).   Specialty: Urgent Care Contact information: [address] [place] [address] [phone]                No Known Allergies  Consultations: None   Procedures/Studies: DG Chest Port 1 View  Result Date: 06/18/2022 CLINICAL DATA:  Provided history: Shortness of breath. Congestion. Chest pain. EXAM: PORTABLE CHEST 1 VIEW COMPARISON:  Prior chest radiographs 10/28/2021 and earlier. Chest CT 10/28/2021. FINDINGS: Heart size within normal limits. No appreciable airspace consolidation. No evidence of pleural effusion or pneumothorax. No acute osseous abnormality identified. Degenerative changes of the spine. IMPRESSION: No evidence of acute cardiopulmonary abnormality. Electronically Signed   By: Kellie Simmering D.O.   On: 06/18/2022 08:12     Discharge Exam: Vitals:   06/20/22 0804 06/20/22 1009  BP:    Pulse:    Resp:    Temp:    SpO2: 97% 94%   Vitals:   06/20/22 0557 06/20/22 0800 06/20/22 0804 06/20/22 1009  BP: 139/81     Pulse: 87     Resp: 18     Temp: (!) 97.3 F (36.3 C)     TempSrc: Oral     SpO2: 99% 97% 97% 94%  Weight:      Height:        General: Pt is alert, awake, not in acute distress Cardiovascular: RRR, S1/S2 +, no rubs, no gallops Respiratory: CTA bilaterally, no wheezing, no rhonchi Abdominal: Soft, NT, ND, bowel sounds + Extremities: no edema, no cyanosis    The results of significant diagnostics from this hospitalization (including imaging, microbiology, ancillary and laboratory) are listed below for reference.     Microbiology: Recent Results (from the past 240 hour(s))  Resp panel by RT-PCR (RSV,  Flu A&B, Covid) Anterior Nasal Swab     Status: None   Collection Time: 06/18/22 12:05 PM   Specimen: Anterior Nasal Swab  Result Value Ref Range Status   SARS Coronavirus 2 by RT PCR NEGATIVE NEGATIVE Final    Comment: (NOTE) SARS-CoV-2 target nucleic acids are NOT DETECTED.  The SARS-CoV-2 RNA is generally detectable in upper respiratory specimens during the acute phase of infection. The lowest concentration of SARS-CoV-2 viral copies this assay can detect is 138 copies/mL. A negative result does not preclude SARS-Cov-2 infection and should not be used as the sole basis for treatment or other patient management decisions. A negative result may occur with  improper specimen collection/handling, submission of specimen other than nasopharyngeal swab, presence of viral mutation(s) within the areas targeted by this assay, and inadequate number of viral copies(<138 copies/mL). A negative result must be combined with clinical observations, patient history, and epidemiological information. The  expected result is Negative.  Fact Sheet for Patients:  EntrepreneurPulse.com.au  Fact Sheet for Healthcare Providers:  IncredibleEmployment.be  This test is no t yet approved or cleared by the [place] FDA and  has been authorized for detection and/or diagnosis of SARS-CoV-2 by FDA under an Emergency Use Authorization (EUA). This EUA will remain  in effect (meaning this test can be used) for the duration of the COVID-19 declaration under Section 564(b)(1) of the Act, 21 U.S.C.section 360bbb-3(b)(1), unless the authorization is terminated  or revoked sooner.       Influenza A by PCR NEGATIVE NEGATIVE Final   Influenza B by PCR NEGATIVE NEGATIVE Final    Comment: (NOTE) The Xpert Xpress SARS-CoV-2/FLU/RSV plus assay is intended as an aid in the diagnosis of influenza from Nasopharyngeal swab specimens and should not be used as a sole basis for  treatment. Nasal washings and aspirates are unacceptable for Xpert Xpress SARS-CoV-2/FLU/RSV testing.  Fact Sheet for Patients: EntrepreneurPulse.com.au  Fact Sheet for Healthcare Providers: IncredibleEmployment.be  This test is not yet approved or cleared by the [place] FDA and has been authorized for detection and/or diagnosis of SARS-CoV-2 by FDA under an Emergency Use Authorization (EUA). This EUA will remain in effect (meaning this test can be used) for the duration of the COVID-19 declaration under Section 564(b)(1) of the Act, 21 U.S.C. section 360bbb-3(b)(1), unless the authorization is terminated or revoked.     Resp Syncytial Virus by PCR NEGATIVE NEGATIVE Final    Comment: (NOTE) Fact Sheet for Patients: EntrepreneurPulse.com.au  Fact Sheet for Healthcare Providers: IncredibleEmployment.be  This test is not yet approved or cleared by the [place] FDA and has been authorized for detection and/or diagnosis of SARS-CoV-2 by FDA under an Emergency Use Authorization (EUA). This EUA will remain in effect (meaning this test can be used) for the duration of the COVID-19 declaration under Section 564(b)(1) of the Act, 21 U.S.C. section 360bbb-3(b)(1), unless the authorization is terminated or revoked.  Performed at [facility], [address], [place], [place] [address]      Labs: BNP (last 3 results) Recent Labs    10/28/21 0229 06/18/22 0758  BNP 20.0 0000000   Basic Metabolic Panel: Recent Labs  Lab 06/18/22 0758 06/19/22 0509 06/20/22 0553  NA 138 137 138  K 3.9 3.9 4.0  CL 103 103 104  CO2 26 24 24   GLUCOSE 121* 139* 142*  BUN 14 19 26*  CREATININE 0.91 0.95 0.95  CALCIUM 9.1 9.2 9.0  MG  --  2.1 2.0   Liver Function Tests: Recent Labs  Lab 06/18/22 0758  AST 26  ALT 33  ALKPHOS 115  BILITOT 0.6  PROT 8.3*  ALBUMIN 4.7   No results for input(s): "LIPASE",  "AMYLASE" in the last 168 hours. No results for input(s): "AMMONIA" in the last 168 hours. CBC: Recent Labs  Lab 06/18/22 0758 06/19/22 0509 06/20/22 0553  WBC 7.1 12.0* 11.0*  NEUTROABS 4.3  --   --   HGB 14.9 13.9 13.4  HCT 46.3 43.3 42.3  MCV 82.2 82.5 83.3  PLT 246 263 249   Cardiac Enzymes: No results for input(s): "CKTOTAL", "CKMB", "CKMBINDEX", "TROPONINI" in the last 168 hours. BNP: Invalid input(s): "POCBNP" CBG: No results for input(s): "GLUCAP" in the last 168 hours. D-Dimer No results for input(s): "DDIMER" in the last 72 hours. Hgb A1c No results for input(s): "HGBA1C" in the last 72 hours. Lipid Profile No results for input(s): "CHOL", "HDL", "LDLCALC", "TRIG", "CHOLHDL", "  LDLDIRECT" in the last 72 hours. Thyroid function studies No results for input(s): "TSH", "T4TOTAL", "T3FREE", "THYROIDAB" in the last 72 hours.  Invalid input(s): "FREET3" Anemia work up No results for input(s): "VITAMINB12", "FOLATE", "FERRITIN", "TIBC", "IRON", "RETICCTPCT" in the last 72 hours. Urinalysis    Component Value Date/Time   BILIRUBINUR negative 09/09/2016 1828   KETONESUR negative 09/09/2016 1828   PROTEINUR negative 09/09/2016 1828   UROBILINOGEN 0.2 09/09/2016 1828   NITRITE Negative 09/09/2016 1828   LEUKOCYTESUR Negative 09/09/2016 1828   Sepsis Labs Recent Labs  Lab 06/18/22 0758 06/19/22 0509 06/20/22 0553  WBC 7.1 12.0* 11.0*   Microbiology Recent Results (from the past 240 hour(s))  Resp panel by RT-PCR (RSV, Flu A&B, Covid) Anterior Nasal Swab     Status: None   Collection Time: 06/18/22 12:05 PM   Specimen: Anterior Nasal Swab  Result Value Ref Range Status   SARS Coronavirus 2 by RT PCR NEGATIVE NEGATIVE Final    Comment: (NOTE) SARS-CoV-2 target nucleic acids are NOT DETECTED.  The SARS-CoV-2 RNA is generally detectable in upper respiratory specimens during the acute phase of infection. The lowest concentration of SARS-CoV-2 viral copies this  assay can detect is 138 copies/mL. A negative result does not preclude SARS-Cov-2 infection and should not be used as the sole basis for treatment or other patient management decisions. A negative result may occur with  improper specimen collection/handling, submission of specimen other than nasopharyngeal swab, presence of viral mutation(s) within the areas targeted by this assay, and inadequate number of viral copies(<138 copies/mL). A negative result must be combined with clinical observations, patient history, and epidemiological information. The expected result is Negative.  Fact Sheet for Patients:  EntrepreneurPulse.com.au  Fact Sheet for Healthcare Providers:  IncredibleEmployment.be  This test is no t yet approved or cleared by the [place] FDA and  has been authorized for detection and/or diagnosis of SARS-CoV-2 by FDA under an Emergency Use Authorization (EUA). This EUA will remain  in effect (meaning this test can be used) for the duration of the COVID-19 declaration under Section 564(b)(1) of the Act, 21 U.S.C.section 360bbb-3(b)(1), unless the authorization is terminated  or revoked sooner.       Influenza A by PCR NEGATIVE NEGATIVE Final   Influenza B by PCR NEGATIVE NEGATIVE Final    Comment: (NOTE) The Xpert Xpress SARS-CoV-2/FLU/RSV plus assay is intended as an aid in the diagnosis of influenza from Nasopharyngeal swab specimens and should not be used as a sole basis for treatment. Nasal washings and aspirates are unacceptable for Xpert Xpress SARS-CoV-2/FLU/RSV testing.  Fact Sheet for Patients: EntrepreneurPulse.com.au  Fact Sheet for Healthcare Providers: IncredibleEmployment.be  This test is not yet approved or cleared by the [place] FDA and has been authorized for detection and/or diagnosis of SARS-CoV-2 by FDA under an Emergency Use Authorization (EUA). This EUA will  remain in effect (meaning this test can be used) for the duration of the COVID-19 declaration under Section 564(b)(1) of the Act, 21 U.S.C. section 360bbb-3(b)(1), unless the authorization is terminated or revoked.     Resp Syncytial Virus by PCR NEGATIVE NEGATIVE Final    Comment: (NOTE) Fact Sheet for Patients: EntrepreneurPulse.com.au  Fact Sheet for Healthcare Providers: IncredibleEmployment.be  This test is not yet approved or cleared by the [place] FDA and has been authorized for detection and/or diagnosis of SARS-CoV-2 by FDA under an Emergency Use Authorization (EUA). This EUA will remain in effect (meaning this test can be used) for the duration  of the COVID-19 declaration under Section 564(b)(1) of the Act, 21 U.S.C. section 360bbb-3(b)(1), unless the authorization is terminated or revoked.  Performed at [facility], [address], [place],  [address]      Time coordinating discharge: 35 minutes  SIGNED:   Rodena Goldmann, DO [facility] 06/20/2022, 10:18 AM  If 7PM-7AM, please contact night-coverage [email or web address]

## 2022-06-20 NOTE — Progress Notes (Deleted)
SATURATION QUALIFICATIONS: (This note is used to comply with regulatory documentation for home oxygen)  Patient Saturations on Room Air at Rest = 92%  Patient Saturations on Room Air while Ambulating = 83%.   Patient Saturations on 2 Liters of oxygen while Ambulating = 92%   

## 2022-06-30 DIAGNOSIS — J441 Chronic obstructive pulmonary disease with (acute) exacerbation: Secondary | ICD-10-CM | POA: Diagnosis not present

## 2022-10-15 DIAGNOSIS — J441 Chronic obstructive pulmonary disease with (acute) exacerbation: Secondary | ICD-10-CM | POA: Diagnosis not present

## 2023-01-11 DIAGNOSIS — J441 Chronic obstructive pulmonary disease with (acute) exacerbation: Secondary | ICD-10-CM | POA: Diagnosis not present

## 2023-01-27 DIAGNOSIS — I10 Essential (primary) hypertension: Secondary | ICD-10-CM | POA: Diagnosis not present

## 2023-01-27 DIAGNOSIS — E785 Hyperlipidemia, unspecified: Secondary | ICD-10-CM | POA: Diagnosis not present

## 2023-01-27 DIAGNOSIS — Z0000 Encounter for general adult medical examination without abnormal findings: Secondary | ICD-10-CM | POA: Diagnosis not present

## 2023-01-27 DIAGNOSIS — Z1321 Encounter for screening for nutritional disorder: Secondary | ICD-10-CM | POA: Diagnosis not present

## 2023-01-27 DIAGNOSIS — Z13228 Encounter for screening for other metabolic disorders: Secondary | ICD-10-CM | POA: Diagnosis not present

## 2023-01-27 DIAGNOSIS — F5104 Psychophysiologic insomnia: Secondary | ICD-10-CM | POA: Diagnosis not present

## 2023-01-27 DIAGNOSIS — Z1329 Encounter for screening for other suspected endocrine disorder: Secondary | ICD-10-CM | POA: Diagnosis not present

## 2023-01-27 DIAGNOSIS — Z125 Encounter for screening for malignant neoplasm of prostate: Secondary | ICD-10-CM | POA: Diagnosis not present

## 2023-01-27 DIAGNOSIS — Z130 Encounter for screening for diseases of the blood and blood-forming organs and certain disorders involving the immune mechanism: Secondary | ICD-10-CM | POA: Diagnosis not present

## 2023-01-27 DIAGNOSIS — Z23 Encounter for immunization: Secondary | ICD-10-CM | POA: Diagnosis not present

## 2023-01-27 DIAGNOSIS — J42 Unspecified chronic bronchitis: Secondary | ICD-10-CM | POA: Diagnosis not present

## 2023-03-22 DIAGNOSIS — I779 Disorder of arteries and arterioles, unspecified: Secondary | ICD-10-CM | POA: Diagnosis not present

## 2023-03-22 DIAGNOSIS — Z01818 Encounter for other preprocedural examination: Secondary | ICD-10-CM | POA: Diagnosis not present

## 2023-03-22 DIAGNOSIS — I7121 Aneurysm of the ascending aorta, without rupture: Secondary | ICD-10-CM | POA: Diagnosis not present

## 2023-09-14 ENCOUNTER — Emergency Department (HOSPITAL_COMMUNITY)

## 2023-09-14 ENCOUNTER — Encounter (HOSPITAL_COMMUNITY): Payer: Self-pay

## 2023-09-14 ENCOUNTER — Emergency Department (HOSPITAL_COMMUNITY)
Admission: EM | Admit: 2023-09-14 | Discharge: 2023-09-14 | Disposition: A | Discharge status: Home / Self Care | Attending: Emergency Medicine | Admitting: Emergency Medicine

## 2023-09-14 ENCOUNTER — Other Ambulatory Visit: Payer: Self-pay

## 2023-09-14 DIAGNOSIS — Y92093 Driveway of other non-institutional residence as the place of occurrence of the external cause: Secondary | ICD-10-CM | POA: Insufficient documentation

## 2023-09-14 DIAGNOSIS — Y908 Blood alcohol level of 240 mg/100 ml or more: Secondary | ICD-10-CM | POA: Diagnosis not present

## 2023-09-14 DIAGNOSIS — Z23 Encounter for immunization: Secondary | ICD-10-CM | POA: Diagnosis not present

## 2023-09-14 DIAGNOSIS — E876 Hypokalemia: Secondary | ICD-10-CM | POA: Insufficient documentation

## 2023-09-14 DIAGNOSIS — J449 Chronic obstructive pulmonary disease, unspecified: Secondary | ICD-10-CM | POA: Diagnosis not present

## 2023-09-14 DIAGNOSIS — I10 Essential (primary) hypertension: Secondary | ICD-10-CM | POA: Diagnosis not present

## 2023-09-14 DIAGNOSIS — F10920 Alcohol use, unspecified with intoxication, uncomplicated: Secondary | ICD-10-CM

## 2023-09-14 DIAGNOSIS — Z79899 Other long term (current) drug therapy: Secondary | ICD-10-CM | POA: Diagnosis not present

## 2023-09-14 DIAGNOSIS — E872 Acidosis, unspecified: Secondary | ICD-10-CM | POA: Insufficient documentation

## 2023-09-14 DIAGNOSIS — S01111A Laceration without foreign body of right eyelid and periocular area, initial encounter: Secondary | ICD-10-CM | POA: Insufficient documentation

## 2023-09-14 DIAGNOSIS — W01198A Fall on same level from slipping, tripping and stumbling with subsequent striking against other object, initial encounter: Secondary | ICD-10-CM | POA: Insufficient documentation

## 2023-09-14 DIAGNOSIS — F10120 Alcohol abuse with intoxication, uncomplicated: Secondary | ICD-10-CM | POA: Diagnosis not present

## 2023-09-14 DIAGNOSIS — W19XXXA Unspecified fall, initial encounter: Secondary | ICD-10-CM

## 2023-09-14 LAB — CBC
HCT: 38.5 % — ABNORMAL LOW (ref 39.0–52.0)
Hemoglobin: 12.5 g/dL — ABNORMAL LOW (ref 13.0–17.0)
MCH: 26.2 pg (ref 26.0–34.0)
MCHC: 32.5 g/dL (ref 30.0–36.0)
MCV: 80.7 fL (ref 80.0–100.0)
Platelets: 277 10*3/uL (ref 150–400)
RBC: 4.77 MIL/uL (ref 4.22–5.81)
RDW: 16.1 % — ABNORMAL HIGH (ref 11.5–15.5)
WBC: 5.5 10*3/uL (ref 4.0–10.5)
nRBC: 0 % (ref 0.0–0.2)

## 2023-09-14 LAB — LACTIC ACID, PLASMA: Lactic Acid, Venous: 2.1 mmol/L (ref 0.5–1.9)

## 2023-09-14 LAB — COMPREHENSIVE METABOLIC PANEL WITH GFR
ALT: 17 U/L (ref 0–44)
AST: 19 U/L (ref 15–41)
Albumin: 3.9 g/dL (ref 3.5–5.0)
Alkaline Phosphatase: 83 U/L (ref 38–126)
Anion gap: 12 (ref 5–15)
BUN: 14 mg/dL (ref 6–20)
CO2: 21 mmol/L — ABNORMAL LOW (ref 22–32)
Calcium: 8.6 mg/dL — ABNORMAL LOW (ref 8.9–10.3)
Chloride: 107 mmol/L (ref 98–111)
Creatinine, Ser: 0.87 mg/dL (ref 0.61–1.24)
GFR, Estimated: >60 mL/min (ref >60)
Glucose, Bld: 104 mg/dL — ABNORMAL HIGH (ref 70–99)
Potassium: 2.8 mmol/L — ABNORMAL LOW (ref 3.5–5.1)
Sodium: 140 mmol/L (ref 135–145)
Total Bilirubin: 0.7 mg/dL (ref 0.0–1.2)
Total Protein: 6.8 g/dL (ref 6.5–8.1)

## 2023-09-14 LAB — URINALYSIS, ROUTINE W REFLEX MICROSCOPIC
Bilirubin Urine: NEGATIVE
Glucose, UA: NEGATIVE mg/dL
Hgb urine dipstick: NEGATIVE
Ketones, ur: NEGATIVE mg/dL
Leukocytes,Ua: NEGATIVE
Nitrite: NEGATIVE
Protein, ur: NEGATIVE mg/dL
Specific Gravity, Urine: 1.004 — ABNORMAL LOW (ref 1.005–1.030)
pH: 6 (ref 5.0–8.0)

## 2023-09-14 LAB — ETHANOL: Alcohol, Ethyl (B): 260 mg/dL — ABNORMAL HIGH (ref <15)

## 2023-09-14 LAB — PROTIME-INR
INR: 1.1 (ref 0.8–1.2)
Prothrombin Time: 14 s (ref 11.4–15.2)

## 2023-09-14 LAB — MAGNESIUM: Magnesium: 2.1 mg/dL (ref 1.7–2.4)

## 2023-09-14 LAB — SAMPLE TO BLOOD BANK

## 2023-09-14 LAB — CBG MONITORING, ED: Glucose-Capillary: 107 mg/dL — ABNORMAL HIGH (ref 70–99)

## 2023-09-14 MED ORDER — IBUPROFEN 800 MG PO TABS
800.0000 mg | ORAL_TABLET | Freq: Once | ORAL | Status: AC
  Administered 2023-09-14: 800 mg via ORAL
  Filled 2023-09-14: qty 2

## 2023-09-14 MED ORDER — POTASSIUM CHLORIDE CRYS ER 20 MEQ PO TBCR: 20.0000 meq | EXTENDED_RELEASE_TABLET | Freq: Two times a day (BID) | ORAL | 0 refills | Status: AC

## 2023-09-14 MED ORDER — BACITRACIN ZINC 500 UNIT/GM EX OINT: 1.0000 | TOPICAL_OINTMENT | Freq: Two times a day (BID) | CUTANEOUS | 0 refills | Status: AC

## 2023-09-14 MED ORDER — POTASSIUM CHLORIDE CRYS ER 20 MEQ PO TBCR
80.0000 meq | EXTENDED_RELEASE_TABLET | Freq: Once | ORAL | Status: AC
  Administered 2023-09-14: 80 meq via ORAL
  Filled 2023-09-14: qty 4

## 2023-09-14 MED ORDER — TETANUS-DIPHTH-ACELL PERTUSSIS 5-2.5-18.5 LF-MCG/0.5 IM SUSY
0.5000 mL | PREFILLED_SYRINGE | Freq: Once | INTRAMUSCULAR | Status: AC
  Administered 2023-09-14: 0.5 mL via INTRAMUSCULAR
  Filled 2023-09-14: qty 0.5

## 2023-09-14 MED ORDER — TRIPLE ANTIBIOTIC 3.5-400-5000 EX OINT
TOPICAL_OINTMENT | Freq: Once | CUTANEOUS | Status: AC
  Administered 2023-09-14: 1 via CUTANEOUS
  Filled 2023-09-14: qty 1

## 2023-09-14 MED ORDER — SODIUM CHLORIDE 0.9 % IV BOLUS
1000.0000 mL | Freq: Once | INTRAVENOUS | Status: AC
  Administered 2023-09-14: 1000 mL via INTRAVENOUS

## 2023-09-14 MED ORDER — LIDOCAINE HCL (PF) 1 % IJ SOLN
10.0000 mL | Freq: Once | INTRAMUSCULAR | Status: AC
  Administered 2023-09-14: 10 mL
  Filled 2023-09-14: qty 10

## 2023-09-14 NOTE — ED Triage Notes (Signed)
 Pt bib ems from home for fall, and alcohol intoxication. Per ems pt's wife saw him fall in the drive way and made contact with concrete. Per ems pt consumed alcohol prior to fall. Abrasion noted above right eye. Scraps and scratched for fall noted as well. EMS gave 4mg  zofran .

## 2023-09-14 NOTE — Discharge Instructions (Addendum)
 Sutured repair Keep the laceration site dry for the next 24 hours and leave the dressing in place. After 24 hours you may remove the dressing and gently clean the laceration site with antibacterial soap and warm water. Do not scrub the area. Do not soak the area and water for long periods of time. Don't use hydrogen peroxide, iodine-based solutions, or alcohol, which can slow healing, and will probably be painful! Apply topical bacitracin 1-2 times per day for the next 3-5 days. Return to the emergency department in 6 - 8 days for removal of the sutures.  You should return sooner for any signs of infection which would include increased redness around the wound, increased swelling, new drainage of yellow pus.   Evaluation for your fall was overall reassuring.  Your potassium level was noted to be low.  I am starting on a 5-day potassium supplement.  Otherwise recommend that you follow with your PCP.

## 2023-09-14 NOTE — ED Provider Notes (Signed)
 [place] EMERGENCY DEPARTMENT AT [facility] Provider Note   CSN: [id number] Arrival date & time: 09/14/23  1734     Patient presents with: Fall and Alcohol Intoxication  HPI Derek Pacheco is a 58 y.o. male with COPD, hypertension presenting for fall in setting of alcohol intoxication.  EMS reports that his wife saw him fall in the driveway and hit his head on the concrete.  He did fall from height.  She also states that he went to lunch today and had a couple of shots at lunchtime.  He is clearly intoxicated.  Does have a abrasion to the right eye.  He denies neck pain.  Denies chest pain or abdominal pain.  Does not have an oxygen requirement for his COPD at home.    Fall  Alcohol Intoxication       Prior to Admission medications   Medication Sig Start Date End Date Taking? Authorizing Provider  albuterol  (PROVENTIL ) (2.5 MG/3ML) 0.083% nebulizer solution Take 3 mLs (2.5 mg total) by nebulization every 6 (six) hours as needed for wheezing. Please place on filer for patient. 07/08/17  Yes Bettie Bruce, PA-C  ALPRAZolam  (XANAX ) 2 MG tablet Take 1 tablet (2 mg total) by mouth at bedtime as needed for anxiety. Do not use this without using you CPAP. Patient taking differently: Take 2 mg by mouth at bedtime. Do not use this without using you CPAP. 10/26/17  Yes Bettie Bruce, PA-C  amLODipine  (NORVASC ) 10 MG tablet Take 10 mg by mouth daily.   Yes [provider]  atorvastatin (LIPITOR) 10 MG tablet Take 10 mg by mouth daily.   Yes [provider]  bacitracin ointment Apply 1 Application topically 2 (two) times daily. 09/14/23  Yes Rylyn Zawistowski K, PA-C  budesonide -formoterol  (SYMBICORT ) 160-4.5 MCG/ACT inhaler Inhale 2 puffs into the lungs 2 (two) times daily. 07/08/17  Yes Bettie Bruce, PA-C  clobetasol (TEMOVATE) 0.05 % external solution Apply 1 Application topically 2 (two) times daily as needed (irritation). 02/03/22  Yes [provider]   halobetasol (ULTRAVATE) 0.05 % cream Apply 1 Application topically 2 (two) times daily as needed (irritation). 04/08/22  Yes [provider]  ibuprofen  (ADVIL ) 800 MG tablet Take 800 mg by mouth every 6 (six) hours as needed for pain. 10/06/21  Yes [provider]  montelukast  (SINGULAIR ) 10 MG tablet Take 1 tablet (10 mg total) by mouth at bedtime. 11/04/17  Yes Antonio Baumgarten, NP  OTEZLA 30 MG TABS Take 1 tablet by mouth 2 (two) times daily. 10/19/21  Yes [provider]  pantoprazole  (PROTONIX ) 40 MG tablet Take 1 tablet (40 mg total) by mouth 2 (two) times daily. Patient taking differently: Take 40 mg by mouth daily as needed (heartburn, acid reflux, indigestion). 10/30/21  Yes Justina Oman, MD  potassium chloride  SA (KLOR-CON  M) 20 MEQ tablet Take 1 tablet (20 mEq total) by mouth 2 (two) times daily for 5 days. 09/14/23 09/19/23 Yes Toia Micale K, PA-C  promethazine  (PHENERGAN ) 12.5 MG tablet Take 12.5 mg by mouth as needed for nausea or vomiting. 09/02/17  Yes [provider]  sildenafil (REVATIO) 20 MG tablet Take 1-4 tablets by mouth as needed (irrectile disfunction). 02/12/22  Yes [provider]  SUMAtriptan  (IMITREX ) 100 MG tablet TAKE 1/2 TO 1 (ONE-HALF TO ONE) TABLET BY MOUTH AS NEEDED FOR  MIGRAINE -- **MAXIMUM OF 200 MG IN 24 HOURS** 10/24/17  Yes Bettie Bruce, PA-C  Peak Flow Meter (AIRZONE  PEAK FLOW METER) DEVI Please use daily to keep track of your breathing. 10/10/15   Bettie Bruce, PA-C    Allergies: Levofloxacin    Review of Systems See HPI  Updated Vital Signs BP 130/83   Pulse 78   Temp 97.7 F (36.5 C) (Oral)   Resp 20   SpO2 97%    Physical Exam   Vitals:   09/14/23 2020 09/14/23 2100  BP: (!) 165/86 130/83  Pulse: 78 78  Resp: (!) 22 20  Temp:    SpO2: 96% 97%    CONSTITUTIONAL:  well-appearing, NAD, intoxicated NEURO:  Alert and oriented x 3, CN 3-12 grossly intact, moving extremities, opening his eyes  spontaneously.  Able to answer simple yes and no questions. EYES:  eyes equal and reactive Head: Abrasion noted about the lateral right eyebrow, no Battle sign, raccoon eyes or rhinorrhea ENT/NECK:  Supple, no stridor, breath as an alcoholic odor CARDIO:  Regular rate and rhythm, appears well-perfused  PULM:  No respiratory distress, CTAB GI/GU:  non-distended, soft, non tender MSK/SPINE:  No gross deformities, no edema, moves all extremities  SKIN:  no rash, atraumatic  *Additional and/or pertinent findings included in MDM below  (all labs ordered are listed, but only abnormal results are displayed) Labs Reviewed  COMPREHENSIVE METABOLIC PANEL WITH GFR - Abnormal; Notable for the following components:      Result Value   Potassium 2.8 (*)    CO2 21 (*)    Glucose, Bld 104 (*)    Calcium 8.6 (*)    All other components within normal limits  CBC - Abnormal; Notable for the following components:   Hemoglobin 12.5 (*)    HCT 38.5 (*)    RDW 16.1 (*)    All other components within normal limits  ETHANOL - Abnormal; Notable for the following components:   Alcohol, Ethyl (B) 260 (*)    All other components within normal limits  LACTIC ACID, PLASMA - Abnormal; Notable for the following components:   Lactic Acid, Venous 2.1 (*)    All other components within normal limits  URINALYSIS, ROUTINE W REFLEX MICROSCOPIC - Abnormal; Notable for the following components:   Color, Urine STRAW (*)    Specific Gravity, Urine 1.004 (*)    All other components within normal limits  CBG MONITORING, ED - Abnormal; Notable for the following components:   Glucose-Capillary 107 (*)    All other components within normal limits  PROTIME-INR  MAGNESIUM   SAMPLE TO BLOOD BANK    EKG: None  Radiology: CT Head Wo Contrast Result Date: 09/14/2023 EXAM: CT HEAD, FACIAL BONES AND CERVICAL SPINE WITHOUT IV CONTRAST. 09/14/2023 06:53:39 PM TECHNIQUE: CT of the head, facial bones and cervical spine was  performed without the administration of intravenous contrast. Multiplanar reformatted images are provided for review. Automated exposure control, iterative reconstruction, and/or weight based adjustment of the mA/kV was utilized to reduce the radiation dose to as low as reasonably achievable. COMPARISON: None available. CLINICAL HISTORY: Polytrauma, blunt. Pt bib ems from home for fall, and alcohol intoxication. Per ems pt's wife saw him fall in the drive way and made contact with concrete. Per ems pt consumed alcohol prior to fall. Abrasion noted above right eye. Scraps and scratched for fall noted as well. FINDINGS: CT HEAD BRAIN/VENTRICLES: There is no acute intracranial hemorrhage, mass effect or midline shift. No abnormal extra-axial fluid collection. The gray-white differentiation is maintained without an acute infarct. There is no hydrocephalus. Mild subcortical and  periventricular small vessel ischemic changes. CT FACIAL BONES FACIAL BONES: The maxilla, pterygoid plates and zygomatic arches are intact. The mandible is intact. The mandibular condyles are normally situated. The nasal bones and maxillary nasal processes are intact. ORBITS: The globes appear intact. The extraocular muscles, optic nerve sheath complexes and lacrimal glands appear unremarkable. No retrobulbar hematoma or mass is seen. The orbital walls and rims are intact. SINUSES AND MASTOIDS: Partial opacification of the bilateral ethmoid and maxillary sinuses. Mastoid air cells are clear. No acute fracture is seen. SOFT TISSUES: No acute abnormality of the soft tissues. CERVICAL SPINE: BONES AND ALIGNMENT: There is no acute fracture or traumatic malalignment. DEGENERATIVE CHANGES: Mild degenerative changes of the lower cervical spine. SOFT TISSUES: There is no prevertebral soft tissue swelling. IMPRESSION: 1. No acute intracranial abnormality. 2. No acute fracture in the cervical spine. 3. No acute traumatic injury of the facial bones.  Electronically signed by: Zadie Herter MD 09/14/2023 07:13 PM EDT RP Workstation: [id number]   CT Cervical Spine Wo Contrast Result Date: 09/14/2023 EXAM: CT HEAD, FACIAL BONES AND CERVICAL SPINE WITHOUT IV CONTRAST. 09/14/2023 06:53:39 PM TECHNIQUE: CT of the head, facial bones and cervical spine was performed without the administration of intravenous contrast. Multiplanar reformatted images are provided for review. Automated exposure control, iterative reconstruction, and/or weight based adjustment of the mA/kV was utilized to reduce the radiation dose to as low as reasonably achievable. COMPARISON: None available. CLINICAL HISTORY: Polytrauma, blunt. Pt bib ems from home for fall, and alcohol intoxication. Per ems pt's wife saw him fall in the drive way and made contact with concrete. Per ems pt consumed alcohol prior to fall. Abrasion noted above right eye. Scraps and scratched for fall noted as well. FINDINGS: CT HEAD BRAIN/VENTRICLES: There is no acute intracranial hemorrhage, mass effect or midline shift. No abnormal extra-axial fluid collection. The gray-white differentiation is maintained without an acute infarct. There is no hydrocephalus. Mild subcortical and periventricular small vessel ischemic changes. CT FACIAL BONES FACIAL BONES: The maxilla, pterygoid plates and zygomatic arches are intact. The mandible is intact. The mandibular condyles are normally situated. The nasal bones and maxillary nasal processes are intact. ORBITS: The globes appear intact. The extraocular muscles, optic nerve sheath complexes and lacrimal glands appear unremarkable. No retrobulbar hematoma or mass is seen. The orbital walls and rims are intact. SINUSES AND MASTOIDS: Partial opacification of the bilateral ethmoid and maxillary sinuses. Mastoid air cells are clear. No acute fracture is seen. SOFT TISSUES: No acute abnormality of the soft tissues. CERVICAL SPINE: BONES AND ALIGNMENT: There is no acute fracture or  traumatic malalignment. DEGENERATIVE CHANGES: Mild degenerative changes of the lower cervical spine. SOFT TISSUES: There is no prevertebral soft tissue swelling. IMPRESSION: 1. No acute intracranial abnormality. 2. No acute fracture in the cervical spine. 3. No acute traumatic injury of the facial bones. Electronically signed by: Zadie Herter MD 09/14/2023 07:13 PM EDT RP Workstation: [id number]   CT Maxillofacial WO CM Result Date: 09/14/2023 EXAM: CT HEAD, FACIAL BONES AND CERVICAL SPINE WITHOUT IV CONTRAST. 09/14/2023 06:53:39 PM TECHNIQUE: CT of the head, facial bones and cervical spine was performed without the administration of intravenous contrast. Multiplanar reformatted images are provided for review. Automated exposure control, iterative reconstruction, and/or weight based adjustment of the mA/kV was utilized to reduce the radiation dose to as low as reasonably achievable. COMPARISON: None available. CLINICAL HISTORY: Polytrauma, blunt. Pt bib ems from home for fall, and alcohol intoxication. Per ems pt's wife saw him fall in  the drive way and made contact with concrete. Per ems pt consumed alcohol prior to fall. Abrasion noted above right eye. Scraps and scratched for fall noted as well. FINDINGS: CT HEAD BRAIN/VENTRICLES: There is no acute intracranial hemorrhage, mass effect or midline shift. No abnormal extra-axial fluid collection. The gray-white differentiation is maintained without an acute infarct. There is no hydrocephalus. Mild subcortical and periventricular small vessel ischemic changes. CT FACIAL BONES FACIAL BONES: The maxilla, pterygoid plates and zygomatic arches are intact. The mandible is intact. The mandibular condyles are normally situated. The nasal bones and maxillary nasal processes are intact. ORBITS: The globes appear intact. The extraocular muscles, optic nerve sheath complexes and lacrimal glands appear unremarkable. No retrobulbar hematoma or mass is seen. The orbital  walls and rims are intact. SINUSES AND MASTOIDS: Partial opacification of the bilateral ethmoid and maxillary sinuses. Mastoid air cells are clear. No acute fracture is seen. SOFT TISSUES: No acute abnormality of the soft tissues. CERVICAL SPINE: BONES AND ALIGNMENT: There is no acute fracture or traumatic malalignment. DEGENERATIVE CHANGES: Mild degenerative changes of the lower cervical spine. SOFT TISSUES: There is no prevertebral soft tissue swelling. IMPRESSION: 1. No acute intracranial abnormality. 2. No acute fracture in the cervical spine. 3. No acute traumatic injury of the facial bones. Electronically signed by: Zadie Herter MD 09/14/2023 07:13 PM EDT RP Workstation: [id number]   DG Chest Port 1 View Result Date: 09/14/2023 CLINICAL DATA:  Hypoxia EXAM: PORTABLE CHEST 1 VIEW COMPARISON:  06/18/2022 FINDINGS: The heart size and mediastinal contours are within normal limits. Both lungs are clear. The visualized skeletal structures are unremarkable. IMPRESSION: No active disease. Electronically Signed   By: Esmeralda Hedge M.D.   On: 09/14/2023 18:45     .Laceration Repair  Date/Time: 09/14/2023 9:55 PM  Performed by: Janalee Mcmurray, PA-C Authorized by: Janalee Mcmurray, PA-C   Consent:    Consent obtained:  Verbal   Consent given by:  Patient   Risks, benefits, and alternatives were discussed: yes     Risks discussed:  Infection and retained foreign body   Alternatives discussed:  No treatment Universal protocol:    Procedure explained and questions answered to patient or proxy's satisfaction: yes     Relevant documents present and verified: yes     Patient identity confirmed:  Verbally with patient and arm band Anesthesia:    Anesthesia method:  Local infiltration   Local anesthetic:  Lidocaine  1% w/o epi Laceration details:    Location:  Face   Face location:  R eyebrow   Length (cm):  3   Depth (mm):  3 Pre-procedure details:    Preparation:  Patient was prepped and  draped in usual sterile fashion Exploration:    Hemostasis achieved with:  Direct pressure   Imaging outcome: foreign body not noted     Wound exploration: wound explored through full range of motion     Wound extent: areolar tissue violated     Contaminated: no   Treatment:    Area cleansed with:  Saline   Amount of cleaning:  Extensive   Irrigation solution:  Sterile saline   Irrigation volume:  750 ml   Irrigation method:  Pressure wash   Visualized foreign bodies/material removed: no     Debridement:  Minimal   Undermining:  None Skin repair:    Repair method:  Sutures   Suture size:  4-0   Suture material:  Nylon   Suture technique:  Simple interrupted  Number of sutures:  3 Repair type:    Repair type:  Intermediate Post-procedure details:    Dressing:  Non-adherent dressing   Procedure completion:  Tolerated well, no immediate complications    Medications Ordered in the ED  sodium chloride  0.9 % bolus 1,000 mL (1,000 mLs Intravenous New Bag/Given 09/14/23 2033)  potassium chloride  SA (KLOR-CON  M) CR tablet 80 mEq (80 mEq Oral Given 09/14/23 2032)  lidocaine  (PF) (XYLOCAINE ) 1 % injection 10 mL (10 mLs Infiltration Given by Other 09/14/23 2122)  Tdap (BOOSTRIX) injection 0.5 mL (0.5 mLs Intramuscular Given 09/14/23 2119)  ibuprofen  (ADVIL ) tablet 800 mg (800 mg Oral Given 09/14/23 2117)                                    Medical Decision Making Amount and/or Complexity of Data Reviewed Labs: ordered. Radiology: ordered.  Risk Prescription drug management.   Initial Impression and Ddx 58 year old well-appearing male presenting for fall in setting of alcohol intoxication.  Exam notable for laceration to the right eyebrow and clearly intoxicated.  DDx includes traumatic brain injury, head bleed, cervical spine injury, electrolyte derangement, alcohol withdrawal, delirium tremens, other Patient PMH that increases complexity of ED encounter:  COPD,  hypertension  Interpretation of Diagnostics - I independent reviewed and interpreted the labs as followed: Hypokalemia (2.8), slight lactic acidosis, ethanol was 260  - I independently visualized the following imaging with scope of interpretation limited to determining acute life threatening conditions related to emergency care: CT scans and chest x-ray, which revealed no acute findings  -I personally reviewed and interpreted EKG which revealed sinus rhythm  Patient Reassessment and Ultimate Disposition/Management On reassessment, mentation had improved he was speaking in full sentences and alert and oriented x 3.  Laceration repair to the right eyebrow was well-tolerated without complication.  Patient was noted to be ambulatory.  Workup overall was reassuring.  Advised him to follow-up PCP.  Discussed return precautions.  Discussed appropriate wound care at home.  Discharged in good condition.  Sent 5-day potassium supplement to his pharmacy.  Patient management required discussion with the following services or consulting groups:  None  Complexity of Problems Addressed Acute complicated illness or Injury  Additional Data Reviewed and Analyzed Further history obtained from: Past medical history and medications listed in the EMR and Prior ED visit notes  Patient Encounter Risk Assessment Prescriptions      Final diagnoses:  Fall, initial encounter  Alcoholic intoxication without complication (HCC)  Eyebrow laceration, right, initial encounter    ED Discharge Orders          Ordered    potassium chloride  SA (KLOR-CON  M) 20 MEQ tablet  2 times daily        09/14/23 2157    bacitracin ointment  2 times daily        09/14/23 2158               Janalee Mcmurray, PA-C 09/14/23 2159    Cheyenne Cotta, MD 09/16/23 1138

## 2023-09-14 NOTE — ED Notes (Signed)
 Pt with observed with even steady gait. No longer slurring words. Pt discharged in care of his friend.   Pt provided discharge instructions and prescription information. Pt was given the opportunity to ask questions and questions were answered.
